# Patient Record
Sex: Female | Born: 1960 | Race: White | Hispanic: No | Marital: Single | Smoking: Never smoker
Health system: Southern US, Community
[De-identification: ages and names within clinical notes are randomized; demographics above are authoritative.]

## PROBLEM LIST (undated history)

## (undated) DIAGNOSIS — G7000 Myasthenia gravis without (acute) exacerbation: Secondary | ICD-10-CM

## (undated) DIAGNOSIS — R002 Palpitations: Secondary | ICD-10-CM

## (undated) DIAGNOSIS — E785 Hyperlipidemia, unspecified: Secondary | ICD-10-CM

## (undated) DIAGNOSIS — I10 Essential (primary) hypertension: Secondary | ICD-10-CM

## (undated) DIAGNOSIS — F329 Major depressive disorder, single episode, unspecified: Secondary | ICD-10-CM

## (undated) DIAGNOSIS — T7840XA Allergy, unspecified, initial encounter: Secondary | ICD-10-CM

## (undated) DIAGNOSIS — Z9289 Personal history of other medical treatment: Secondary | ICD-10-CM

## (undated) DIAGNOSIS — D649 Anemia, unspecified: Secondary | ICD-10-CM

## (undated) DIAGNOSIS — M436 Torticollis: Secondary | ICD-10-CM

## (undated) DIAGNOSIS — D072 Carcinoma in situ of vagina: Secondary | ICD-10-CM

## (undated) DIAGNOSIS — Z87411 Personal history of vaginal dysplasia: Secondary | ICD-10-CM

## (undated) DIAGNOSIS — R0600 Dyspnea, unspecified: Secondary | ICD-10-CM

## (undated) DIAGNOSIS — F32A Depression, unspecified: Secondary | ICD-10-CM

## (undated) HISTORY — DX: Myasthenia gravis without (acute) exacerbation: G70.00

## (undated) HISTORY — DX: Depression, unspecified: F32.A

## (undated) HISTORY — DX: Essential (primary) hypertension: I10

## (undated) HISTORY — DX: Anemia, unspecified: D64.9

## (undated) HISTORY — DX: Hyperlipidemia, unspecified: E78.5

## (undated) HISTORY — PX: CARDIOVASCULAR STRESS TEST: SHX262

## (undated) HISTORY — DX: Allergy, unspecified, initial encounter: T78.40XA

## (undated) HISTORY — DX: Major depressive disorder, single episode, unspecified: F32.9

## (undated) HISTORY — PX: TRANSTHORACIC ECHOCARDIOGRAM: SHX275

---

## 2000-05-07 ENCOUNTER — Inpatient Hospital Stay (HOSPITAL_COMMUNITY): Admission: RE | Admit: 2000-05-07 | Discharge: 2000-05-13 | Payer: Self-pay | Admitting: Obstetrics and Gynecology

## 2000-05-07 ENCOUNTER — Encounter (INDEPENDENT_AMBULATORY_CARE_PROVIDER_SITE_OTHER): Payer: Self-pay | Admitting: Specialist

## 2000-05-07 HISTORY — PX: ABDOMINAL HYSTERECTOMY: SHX81

## 2000-05-08 ENCOUNTER — Encounter: Payer: Self-pay | Admitting: Anesthesiology

## 2000-05-08 HISTORY — PX: OTHER SURGICAL HISTORY: SHX169

## 2000-05-11 ENCOUNTER — Encounter: Payer: Self-pay | Admitting: Obstetrics and Gynecology

## 2002-03-12 ENCOUNTER — Encounter (INDEPENDENT_AMBULATORY_CARE_PROVIDER_SITE_OTHER): Payer: Self-pay | Admitting: *Deleted

## 2002-03-12 ENCOUNTER — Ambulatory Visit (HOSPITAL_BASED_OUTPATIENT_CLINIC_OR_DEPARTMENT_OTHER): Admission: RE | Admit: 2002-03-12 | Discharge: 2002-03-13 | Payer: Self-pay | Admitting: Specialist

## 2002-03-12 HISTORY — PX: COMBINED ABDOMINOPLASTY AND LIPOSUCTION: SUR284

## 2003-08-31 ENCOUNTER — Other Ambulatory Visit: Admission: RE | Admit: 2003-08-31 | Discharge: 2003-08-31 | Payer: Self-pay | Admitting: Internal Medicine

## 2003-09-01 ENCOUNTER — Encounter: Payer: Self-pay | Admitting: Internal Medicine

## 2003-09-01 ENCOUNTER — Encounter: Admission: RE | Admit: 2003-09-01 | Discharge: 2003-09-01 | Payer: Self-pay | Admitting: Internal Medicine

## 2003-09-21 ENCOUNTER — Encounter: Payer: Self-pay | Admitting: Internal Medicine

## 2003-09-21 ENCOUNTER — Encounter: Admission: RE | Admit: 2003-09-21 | Discharge: 2003-09-21 | Payer: Self-pay | Admitting: Internal Medicine

## 2004-10-09 ENCOUNTER — Encounter (HOSPITAL_COMMUNITY): Admission: RE | Admit: 2004-10-09 | Discharge: 2005-01-07 | Payer: Self-pay | Admitting: General Surgery

## 2004-10-25 ENCOUNTER — Ambulatory Visit (HOSPITAL_COMMUNITY): Admission: RE | Admit: 2004-10-25 | Discharge: 2004-10-25 | Payer: Self-pay | Admitting: Endocrinology

## 2004-11-06 ENCOUNTER — Ambulatory Visit (HOSPITAL_COMMUNITY): Admission: RE | Admit: 2004-11-06 | Discharge: 2004-11-06 | Payer: Self-pay | Admitting: Endocrinology

## 2005-01-08 ENCOUNTER — Ambulatory Visit (HOSPITAL_COMMUNITY): Admission: RE | Admit: 2005-01-08 | Discharge: 2005-01-08 | Payer: Self-pay | Admitting: Neurology

## 2005-04-15 ENCOUNTER — Encounter (HOSPITAL_COMMUNITY): Admission: RE | Admit: 2005-04-15 | Discharge: 2005-07-14 | Payer: Self-pay | Admitting: Neurology

## 2005-04-15 ENCOUNTER — Ambulatory Visit (HOSPITAL_COMMUNITY): Admission: RE | Admit: 2005-04-15 | Discharge: 2005-04-15 | Payer: Self-pay | Admitting: Thoracic Surgery

## 2005-04-29 ENCOUNTER — Inpatient Hospital Stay (HOSPITAL_COMMUNITY): Admission: RE | Admit: 2005-04-29 | Discharge: 2005-05-04 | Payer: Self-pay | Admitting: Thoracic Surgery

## 2005-04-29 ENCOUNTER — Encounter (INDEPENDENT_AMBULATORY_CARE_PROVIDER_SITE_OTHER): Payer: Self-pay | Admitting: Specialist

## 2005-04-29 HISTORY — PX: OTHER SURGICAL HISTORY: SHX169

## 2005-05-08 ENCOUNTER — Ambulatory Visit (HOSPITAL_COMMUNITY): Admission: RE | Admit: 2005-05-08 | Discharge: 2005-05-09 | Payer: Self-pay | Admitting: Thoracic Surgery

## 2005-05-15 ENCOUNTER — Encounter: Admission: RE | Admit: 2005-05-15 | Discharge: 2005-05-15 | Payer: Self-pay | Admitting: Thoracic Surgery

## 2005-06-04 ENCOUNTER — Encounter: Admission: RE | Admit: 2005-06-04 | Discharge: 2005-06-04 | Payer: Self-pay | Admitting: Thoracic Surgery

## 2006-07-16 ENCOUNTER — Encounter: Admission: RE | Admit: 2006-07-16 | Discharge: 2006-07-16 | Payer: Self-pay | Admitting: Geriatric Medicine

## 2006-10-24 ENCOUNTER — Encounter: Admission: RE | Admit: 2006-10-24 | Discharge: 2006-10-24 | Payer: Self-pay | Admitting: Geriatric Medicine

## 2008-02-11 ENCOUNTER — Emergency Department (HOSPITAL_COMMUNITY): Admission: EM | Admit: 2008-02-11 | Discharge: 2008-02-11 | Payer: Self-pay | Admitting: Emergency Medicine

## 2008-02-17 ENCOUNTER — Ambulatory Visit (HOSPITAL_COMMUNITY): Admission: RE | Admit: 2008-02-17 | Discharge: 2008-02-17 | Payer: Self-pay | Admitting: Neurosurgery

## 2008-03-14 ENCOUNTER — Encounter: Admission: RE | Admit: 2008-03-14 | Discharge: 2008-06-12 | Payer: Self-pay | Admitting: Neurosurgery

## 2008-09-19 ENCOUNTER — Encounter: Admission: RE | Admit: 2008-09-19 | Discharge: 2008-12-18 | Payer: Self-pay | Admitting: Anesthesiology

## 2008-09-20 ENCOUNTER — Ambulatory Visit: Payer: Self-pay | Admitting: Anesthesiology

## 2008-10-04 ENCOUNTER — Ambulatory Visit: Payer: Self-pay | Admitting: Anesthesiology

## 2008-10-18 ENCOUNTER — Ambulatory Visit: Payer: Self-pay | Admitting: Anesthesiology

## 2009-05-19 ENCOUNTER — Emergency Department (HOSPITAL_COMMUNITY): Admission: EM | Admit: 2009-05-19 | Discharge: 2009-05-19 | Payer: Self-pay | Admitting: Family Medicine

## 2010-10-17 ENCOUNTER — Ambulatory Visit (HOSPITAL_COMMUNITY): Admission: RE | Admit: 2010-10-17 | Discharge: 2010-10-17 | Payer: Self-pay | Admitting: Family Medicine

## 2010-11-07 ENCOUNTER — Inpatient Hospital Stay (HOSPITAL_COMMUNITY): Admission: RE | Admit: 2010-11-07 | Discharge: 2010-11-08 | Payer: Self-pay | Admitting: Neurosurgery

## 2010-11-07 HISTORY — PX: LUMBAR DISC SURGERY: SHX700

## 2011-02-26 LAB — BASIC METABOLIC PANEL
BUN: 9 mg/dL (ref 6–23)
CO2: 23 mEq/L (ref 19–32)
Calcium: 9.2 mg/dL (ref 8.4–10.5)
Chloride: 109 mEq/L (ref 96–112)
Creatinine, Ser: 0.78 mg/dL (ref 0.4–1.2)
GFR calc Af Amer: >60 mL/min (ref >60)
GFR calc non Af Amer: >60 mL/min (ref >60)
Glucose, Bld: 81 mg/dL (ref 70–99)
Potassium: 4.2 mEq/L (ref 3.5–5.1)
Sodium: 138 mEq/L (ref 135–145)

## 2011-02-26 LAB — CBC
HCT: 42.1 % (ref 36.0–46.0)
Hemoglobin: 14.3 g/dL (ref 12.0–15.0)
MCH: 28.7 pg (ref 26.0–34.0)
MCHC: 34 g/dL (ref 30.0–36.0)
MCV: 84.5 fL (ref 78.0–100.0)
Platelets: 311 10*3/uL (ref 150–400)
RBC: 4.98 MIL/uL (ref 3.87–5.11)
RDW: 13.5 % (ref 11.5–15.5)
WBC: 10 10*3/uL (ref 4.0–10.5)

## 2011-02-26 LAB — SURGICAL PCR SCREEN
MRSA, PCR: NEGATIVE
Staphylococcus aureus: NEGATIVE

## 2011-04-30 NOTE — Procedures (Signed)
Regina Huber, BAZAR NO.:  [id number]   MEDICAL RECORD NO.:  [id number]         PATIENT TYPE:  HREC   LOCATION:                                 FACILITY:   PHYSICIAN:  Celene Kras, MD        DATE OF BIRTH:  Nov 29, 1961   DATE OF PROCEDURE:  DATE OF DISCHARGE:                               OPERATIVE REPORT   [facility].  I evaluated her.  We  obtained history and performed a 14-point review of systems.  Referred  to [place] through the [facility].  She has had about a year's pain in  the paracervical region after an incident hanging some curtains.  She  heard a pop.  Subsequent to this, she has been seen by our neurosurgical  colleagues and chiropractic at which she was a nonsurgical candidate.  Does not describe classic radicular symptoms.  She is describing more of  a myofascial mechanical pain, but has at times had arm pain.  She is  describing her pain as 8/10 on a subjective scale, difficulty with most  activities of daily living, frequent night wakening.  Her neurosurgeon  was Dr. Donalee Citrin.  I have reviewed notes.  She has had an MRI.  She has  some degenerative changes with facetal overlay.  She has been trialed  conservative management including steroid dose pack.   PAST MEDICAL HISTORY:  Remarkable for C-section x2, she has twins,  hysterectomy, thymectomy 2 years ago.  She also has had hypertension.   MEDICATIONS:  Attached to the chart.   SOCIAL HISTORY:  She is married and nonsmoker.   FAMILY HISTORY:  Heart disease, diabetes, hypertension, and alcohol  abuse.   REVIEW OF SYSTEMS, FAMILY, AND SOCIAL HISTORY:  Otherwise  noncontributory.   PHYSICAL EXAMINATION:  GENERAL:  A pleasant, mildly obese female,  sitting comfortably in bed.  Gait, affect, appearance is normal.  Alert  and oriented x3.  HEENT:  Otherwise unremarkable.  CHEST:  Clear to auscultation and percussion.  Regular rate and rhythm  without rub, murmur, or gallop.  ABDOMEN:  Soft, nontender, benign.  No hepatosplenomegaly.   She has diffuse paracervical suprascapular myofascial discomfort,  positive cervical facetal compression test, right and left.  Suboccipital compression test positive.  Good grip strength and  otherwise intact neurologically.   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   PLAN:  1. I would like the broad brush stroke to begin with cervical      epidural, follow expectantly.  Should she have a response, we would      consider second in series.  2. If the arm pain significantly improves, we will address the      spondylitic component with facet-mediated intervention, consider RF      with positive predictive experience, probably our best non-surgical      position.  3. Review of medication.  Appropriate.  We will go ahead and get her      on Ultram in the ER, and  Lyrica with Flector patches discussed with      her.  No barrier to communication.  She has consented for today's      procedure.   The patient was taken to fluoroscopy suite and placed in prone position.  Back prepped and draped in the usual fashion.  Using a Hustead needle, I  advanced to C7-T1 interspace without any evidence of CSF, heme, or  paresthesia.  Test block uneventfully followed 40 mg Aristocort and  flushed the needle.   She tolerated the procedure well.  No complications from procedure.  Appropriate recovery.  See her in follow up.           ______________________________  Celene Kras, MD     HH/MEDQ  D:  09/20/2008 11:35:06  T:  09/21/2008 00:08:15  Job:  [id number]

## 2011-04-30 NOTE — Procedures (Signed)
NAMECARLETA, Regina Huber                 ACCOUNT NO.:  [id number]   MEDICAL RECORD NO.:  [id number]         PATIENT TYPE:  aecp   LOCATION:                                 FACILITY:   PHYSICIAN:  Celene Kras, MD        DATE OF BIRTH:  1961/05/25   DATE OF PROCEDURE:  DATE OF DISCHARGE:                               OPERATIVE REPORT   Glenis [facility].  I evaluated her.  We  obtained history and performed 14-point review of systems.   Sallyann comes to [place] today complaining of:  1. Bilateral cervical discomfort, less arm pain, I am going to go on      to cervical facet medial branch intervention.  She will assess      frequency, intensity, and duration of headaches, she will assess      symptom management, and I have given benchmarks.  Consider RF.  2. Other modifiable features in health profile reviewed.  Review of      medication.  Another rationale performed.  Intervention minimize      escalation controlled substances.  She is a forthright engaging      individual.  I think she will probably do pretty well.   Objectively, diffuse paracervical myofascial discomfort, positive  cervical fetal compression test right and left with suboccipital  compression test positive.  Suprascapular and levator scapular pain.  Nothing new neurologically.   IMPRESSION:  Spondylosis, degenerative spine disease of the cervical  spine.   PLAN:  Cervical facet medial branch intervention C4, C5, C6, and C7  contributory innervation addressed under local anesthetic.  Predicate  further intervention based on need and overall response.  Questions  answered, used models.  She is consented.   The patient was taken to fluoroscopy suite and placed in supine position  and prepped and draped in the usual fashion.  Using a 25-gauge spinal  needle, I advanced the cervical facet at C4, C5, C6, and C7,  contributory innervation addressed, with C3 as elements of cervicogenic  component.   This is at the bifurcation.  We then injected 0.5 mL of  lidocaine 1% MPF at each level with a total of 40 mg Aristocort in  divided dose.   She tolerated the procedure well.  No complications from our procedure.  Appropriate recovery.  Discharge instructions given.           ______________________________  Celene Kras, MD     HH/MEDQ  D:  10/04/2008 11:52:11  T:  10/05/2008 00:50:34  Job:  [id number]

## 2011-04-30 NOTE — Assessment & Plan Note (Signed)
[facility] in [facility] today.  I evaluated her,  obtained history, and performed 14-point review of systems.  Slow  recrudescence.  She had benefit from last intervention and another block  is warranted sequentially prior to moving toward RF to demonstrate  improvement in benchmarks.  1. I do not think we are going to need to do that at this time, as we      are going to have to hold off couple weeks.  She has the seasonal      flu, and she is having benefit from block, but breaking through non-      narcotic medication alternatives.  I am going to go ahead and      switch her to Nucynta, with cautions given, reviewed with her.  2. Other modifiable features and health profile also reviewed.  Home      based therapy.  I discussed RF, risks, complications, options, and      implications, which we would consider at some point, but we will      see how she does with the second block.   Objectively, diffuse paracervical myofascial, positive cervical facet  compression test typical to her pain, not quite at baseline, improved  from a myofascial prospective, nothing new neurological.   IMPRESSION:  Spondylosis.   PLAN:  Agent change, we will see her in 2 weeks, consider sequential  intervention, possibly moving in our decision treat towards RF.           ______________________________  Celene Kras, MD     HH/MedQ  D:  10/18/2008 15:00:45  T:  10/19/2008 01:42:58  Job #:  [id number]

## 2011-05-03 NOTE — Op Note (Signed)
[facility]  Patient:    Regina Huber, Regina Huber                         MRN: [id number] Adm. Date:  [id number] Attending:  Lendon Colonel                           Operative Report  PREOPERATIVE DIAGNOSIS:  Massive intraperitoneal bleeding.  POSTOPERATIVE DIAGNOSIS:  Massive intraperitoneal bleeding.  PROCEDURE:  Laparotomy with suture of intraperitoneal bleeding (uterine artery).  DESCRIPTION OF PROCEDURE:  The patient was placed in the lithotomy position, prepped and draped in the usual fashion.  A Foley catheter was already inserted, and the transverse incision was opened quite easily, the fascia opened transversely, and the peritoneum entered without difficulty.  There was a massive amount of intraperitoneal blood and blood in the cul-de-sac. Initially I thought maybe the ovarian pedicles were bleeding, but they were not, and on visualizing the cul-de-sac, the bleeder was the left uterine artery, which was freely bleeding and clamped with a tonsil clamp and suture ligated with a 3-0 Vicryl suture.  This arrested the bleeding.  I then resutured the ovarian pedicles to be sure, irrigated her, and closely watched the bleeding that was hemostatically secure.  The parietal peritoneum was then closed with 2-0 PDS, the fascia was closed with 2-0 PDS, and the skin was closed with clips.  The incision and peritoneal cavity were irrigated, and all blood was removed as best as we could.  The patient tolerated this procedure well, was sent to the recovery room in good condition. DD:  05/08/00 TD:  05/13/00 Job: [id number]

## 2011-05-03 NOTE — Op Note (Signed)
NAMESTACI, DACK                 ACCOUNT NO.:  [id number]   MEDICAL RECORD NO.:  [id number]          PATIENT TYPE:  INP   LOCATION:  2899                         FACILITY:  [facility]   PHYSICIAN:  Ines Bloomer, M.D. DATE OF BIRTH:  [birth year]   DATE OF PROCEDURE:  04/29/2005  DATE OF DISCHARGE:                                 OPERATIVE REPORT   PREOPERATIVE DIAGNOSIS:  Myasthenia gravis, thymic hyperplasia.   POSTOPERATIVE DIAGNOSIS:  Myasthenia gravis, thymic hyperplasia.   OPERATION PERFORMED:  Transsternal thymectomy.   SURGEON:  Dorita Sciara, M.D.   ANESTHESIA:  General.   After general anesthesia, the patient was prepped and draped in the usual  sterile manner.  An incision was made starting at the sternal notch and  going down about 6 cm with knife.  The subcutaneous tissue and fascia was  divided with electrocautery and then the sternal saw was used to partially  split the sternum down through the manubrium.  A laminar Gelfoam was applied  to the marrow and a laminar spreader was inserted.  Then the dissection was  started in the neck, dissecting out both the left and the right horn of the  thymus gland, dissecting out from the tissues around it and one large vessel  was doubly ligated with 2-0 silk and divided and another one clipped with  Ligaclips.  After the upper horn had been divided then the thymus was  dissected superiorly around the innominate vein and the innominate vein was  then looped to vascular tape.  The thymus was also taken off inferiorly  around the innominate vein.  Dissection was started down the left side,  dissecting the thymus up from the left pleura.  The pleura was entered,  however, and the pleura was dissected off and the phrenic nerve was  identified on the left side and protected.  Then the inferior portion of the  thymus clamp was dissected up off the pericardium inferiorly and dissection  was carried over the midline toward the right,  dissecting up the right lower  lobe.  Then the right inferior portion was dissected free again, dissecting  the pleura off the thymus gland and then dissecting superiorly up and  finally removing the rest of the thymus around the innominate superior vena  cava junction.  After thymus was removed, all small vessels were  anticoagulated and possible residual thymus tissue was removed.  The area  was irrigated copiously.  A Blake drain was placed in the area and brought  out through the neck.  The sternum was closed with 4 wires, #1 Vicryl in the  muscle layer, then 3-0 Vicryl as a subcuticular stitch, Dermabond for the  skin.  The patient returned to the recovery room in stable condition.      DPB/MEDQ  D:  04/29/2005  T:  04/29/2005  Job:  [id number]

## 2011-05-03 NOTE — H&P (Signed)
Regina Huber, Regina Huber                 ACCOUNT NO.:  [id number]   MEDICAL RECORD NO.:  [id number]          PATIENT TYPE:  INP   LOCATION:  NA                           FACILITY:  [facility]   PHYSICIAN:  Ines Bloomer, M.D. DATE OF BIRTH:  07-04-1961   DATE OF ADMISSION:  04/29/2005  DATE OF DISCHARGE:                                HISTORY & PHYSICAL   CHIEF COMPLAINT:  Weakness.   HISTORY OF PRESENT ILLNESS:  This 50 year old, married [employer] employee, has  diagnosis of myasthenia gravis with positive cholinoreceptors antibody and  CT scan that shows stomach hyperplasia.  She is treated with __________  bromide 60 mg 3 times a day as well as CellCept up to 100 mg twice a day.  Other medications include metazoal 10 mg daily and atenolol 25 mg daily for  hypertension.  Symptoms include diplopia with ptosis, and she also has  generalized weakness and a positive Tensilon test.  She also was found to  have hypothyroidism and was evaluated by Dr. Evlyn Kanner for a nodule of the left  thyroid.  She has had two episodes of shortness of breath.  As mentioned,  has occasional weakness in arms and legs.  She is referred for thymectomy. A  Diatek catheter was inserted, and she had plasmapheresis for two weeks and  now comes in for the same dynamic.   PAST MEDICAL HISTORY:  1.  Hypothyroidism.  2.  C sections in 1988 and 1995.  3.  Hysterectomy in 2001.   ALLERGIES:  PENICILLIN causes a rash.   FAMILY HISTORY:  Negative for cancer and vascular disease.   SOCIAL HISTORY:  She is married and has two daughters and one son.  She does  not drink alcohol, does not smoke.  She works as an Airline pilot at [employer].   REVIEW OF SYSTEMS:  She has had some recent weight gain and is on  prednisone.  She weighs 185 pounds.  She is 5 feet 8 inches.  CARDIAC:  No  history of angina or palpitations.  PULMONARY:  She has had shortness of  breath as mentioned, but no hemoptysis, excessive sputum or pneumonia.   GI:  No nausea, vomiting, constipation, diarrhea.  Has some occasional dysphagia.  GU: She has had some problems with loss of bladder control but no dysuria.  VASCULAR:  No claudication, TIAs, but does have the blurred vision.  NEUROLOGIC:  As in History of Present Illness.  She has had weakness and  loss of energy and fatigue with weakness in her arms and legs.  She has no  history of seizures or recent headaches.  She complains of chronic joint  pain in her lower extremities.  SKIN:  Has had intermittent rash.  PSYCHIATRIC:  No history of depression or nervousness. ENT:  Diplopia.  No  change in hearing.  HEMATOLOGICAL:  No history of anemia.   PHYSICAL EXAMINATION:  GENERAL:  Well-developed, Caucasian female in no  acute distress.  VITAL SIGNS:  Blood pressure 134/80, respirations 18, O2 saturation 98%.  HEAD:  Atraumatic.  EYES:  Pupils equal,  round, and reactive to light and accommodations.  Extraocular movements are normal.  Tympanic membranes are intact.  Ears  symmetric and membranes intact.  Nares have no septal deviation and no  rhinitis. Mouth without lesions.  Uvula in midline, tongue in midline.  NECK:  Mild thyromegaly.  No carotid bruits.  No supraclavicular or axillary  adenopathy.  CHEST:  Clear to auscultation and percussion.  HEART:  Regular sinus rhythm.  No murmurs.  Pulses 2+.  BREASTS:  Without masses.  ABDOMEN:  Soft.  No hepatosplenomegaly.  EXTREMITIES:  No clubbing or edema.  NEUROLOGIC:  Alert and oriented x 3.  Deep tendon reflexes 2+.  Sensory and  motor are intact.  I did not notice any gross weakness or lateralized  weakness.  SKIN:  Without lesion.   IMPRESSION:  1.  Myasthenia gravis.  2.  Hypothyroidism.  3.  Hypertension.   PLAN:  Thymectomy.      DPB/MEDQ  D:  04/28/2005  T:  04/28/2005  Job:  [id number]

## 2011-05-03 NOTE — Discharge Summary (Signed)
NAMECONYA, Regina Huber                 ACCOUNT NO.:  [id number]   MEDICAL RECORD NO.:  [id number]          PATIENT TYPE:  INP   LOCATION:  3309                         FACILITY:  [facility]   PHYSICIAN:  Ines Bloomer, M.D. DATE OF BIRTH:  1961/03/24   DATE OF ADMISSION:  04/29/2005  DATE OF DISCHARGE:  05/04/2005                                 DISCHARGE SUMMARY   ADMISSION DIAGNOSIS:  Myasthenia gravis with thymic hyperplasia.   SECONDARY DIAGNOSES:  1.  Myasthenia gravis with thymic hyperplasia, status post thymectomy.  2.  Postoperative moderate left pleural effusion responsive to diuretic      therapy.  3.  History of cesarean section in 1988 and 1995.  4.  Abdominal hysterectomy in 2001.  5.  History of Diatek catheter insertion Apr 15, 2005 for plasmapheresis.  6.  History of hyperthyroidism with nodule of the left thyroid followed by      Dr. Tera Mater. [place].  7.  Allergic to penicillin which causes a rash.  8.   PROCEDURE:  Apr 29, 2005:  Transdermal thymectomy by Dr. Ines Bloomer.   HISTORY OF PRESENT ILLNESS:  Regina Huber is a 49 year old female with history of  myasthenia gravis. She apparently had a positive acetylcholine receptor  antibody and a CT scan which showed thymic hyperplasia. She has been treated  with pyridostigmine bromide 60 mg three times a day as well as Cellcept up  to 1000 mg twice a day. Other medications include methimazole 10 mg twice a  day and Atenolol for hypertension. Her symptoms had included diplopia with  ptosis and a positive Tensilon test. She was found to have hyperthyroidism  and was referred to Dr. Tera Mater. [place] and underwent an evaluation. She  was found to have an occult nodule on the left thyroid.   Prior to admission, she had two episodes of shortness of breath and still  has occasional weakness in her arms and legs. She was referred to Dr. Ines Bloomer for thymectomy. Recently, a Diatek catheter was inserted and she  did  undergo five plasmapheresis treatments.   HOSPITAL COURSE:  Regina Huber was admitted to [facility] on  Apr 29, 2005 and underwent a thymectomy by Dr. Ines Bloomer. Oblique  drain was placed in the mediastinal area intraoperatively. Postoperatively,  she was transferred to the surgical intensive care unit. Neurology was  notified on her admission and saw her on a regular basis. Regina Huber  hospitalization was rather uneventful, although she di develop a moderate  left pleural effusion which responded to diuretic therapy. Chest x-ray  showed bilateral basilar atelectasis and aggressive pulmonary toilet was  encouraged. She was treated empirically with vancomycin postoperatively.  Otherwise, she remained afebrile. Labs were stable postoperatively. Her  drain was discontinued before discharge. She was felt stable for transfer to  step down unit on postoperative day one. Her pain was initially managed with  a morphine PCA but was seen and able to transition to Tylox p.r.n. She did  have mild postoperative nausea but was overall able to tolerate an  oral  diet. She was able to avoid following Foley catheter removal and did have a  bowel movement postoperatively. She was able to ambulate in the hallways. It  was determined that she would not need home health therapy after discharge.   Her incisions remained clean and dry without signs of infection. By  postoperative day four, she was felt nearly ready for discharge home. She  remained afebrile with vital signs stable at 110/55 with heart rate in the  70s to 80s and sinus rhythm. She was saturating 92% on room air. Follow-up  chest x-ray did confirm decreasing left pleural effusion with remaining left  lower lobe and mid-right lower lobe atelectasis which remained stable. If  there were no significant changes in the next 24 hours, it was felt that [name] would be ready for discharge home on postoperative day five, May 04, 2005.   LABORATORY DATA:  Recent labs show a white blood cell count of 11.1,  hemoglobin of 11.1, hematocrit 32.7, platelet count 263,000. Sodium 140,  potassium 4.1, blood glucose 118, BUN less than 1, creatinine 0.8, total  bilirubin, alkaline phosphate 48, SGOT 19, SGPT 18, total protein 4.8, blood  albumin 3.1, calcium 8.2.   DISCHARGE MEDICATIONS:  1.  CellCept 500 mg 2 tablets p.o. b.i.d.  2.  Atenolol 25 mg p.o. daily.  3.  Multivitamin p.o. daily.  4.  Calcium supplement daily.  5.  Pyridostigmine 60 mg p.o. t.i.d.  6.  Methimazole 10 mg p.o. b.i.d.  7.  Tylox 1-2 tablets p.o. q.4h. p.r.n. pain.   DISCHARGE INSTRUCTIONS:  She is instructed to avoid driving, heavy lifting  greater than 10 pounds. No strenuous activity. She is encouraged to continue  daily walking exercises. She may resume a regular diet. She may shower and  clean her incisions with gently with mild soap and water. She should notify  the [facility] office if she develops fever of greater than 101 or redness or  drainage from her incision site.   FOLLOW UP:  She is to follow up with Dr. Ines Bloomer at the [facility] office  on Wednesday, May 15, 2005 at 1:20 p.m. She is to have a chest x-ray one  hour before at [facility] and instructed to bring her chest x-ray  films with her to the [facility] office. She is to call and schedule a follow-up  with [facility].      AWZ/MEDQ  D:  05/03/2005  T:  05/04/2005  Job:  [id number]   cc:   Patient's chart   [facility] Office   [facility]

## 2011-05-03 NOTE — Op Note (Signed)
[facility]  Patient:    Regina Huber, Regina Huber                         MRN: [id number] Proc. Date: 05/07/00 Adm. Date:  [id number] Attending:  Lendon Colonel                           Operative Report  PREOPERATIVE DIAGNOSIS:  Persistent menorrhagia.  POSTOPERATIVE DIAGNOSES: 1. Persistent menorrhagia, possible adenomyosis. 2. Cyst of right ovary, most likely corpus luteum cyst of right ovary.  OPERATION: 1. Total abdominal hysterectomy. 2. Right ovarian cystectomy.  SURGEON:  Katherine Roan, M.D.  DESCRIPTION OF PROCEDURE:  The patient was placed in the lithotomy position and  prepped and draped in the usual fashion.  The bladder was catheterized and a transverse incision was made in the abdomen, and extended in layers to the peritoneal cavity, which was entered vertically.  Exploration of the upper abdomen revealed two normal kidneys and liver.  I felt no gallstones.  The abdominal viscera were then packed away from the pelvic viscera.  There was a small cyst n the right ovary.  The left ovary was normal.  The cornual aspects of the uterus  were grasped and the uterus was elevated.  The utero___ anastomoses on each side were ligated with suture ligatures and free ties.  The round ligaments were suture ligated.  Following this the bladder flap was dissected off of the lower segment. The uterine vessels were skeletonized and ligated.  The Cardinals and uterosacral ligaments were clamped and ligated and identified.  The angles of the vagina were entered.  The specimens removed from the operative field.  The vagina was then closed with a horizontal mattress suture of #0 chromic, and figure-of-eight sutures of #0 Vicryl.  The uterosacral ligaments were carefully plicated in the midline for good wall support, and the retroperitoneal base was closed with #3-0 Vicryl. Hemostasis was secured.  The right ovary was then delivered into the  operative field, and the ovarian cystectomy performed.  The ovarian stroma was then closed with the mattress suture of #3-0 Vicryl.  Hemostasis was accomplished with the Bovie as well.  The pelvis was irrigated with copious amounts of saline.  The parietal peritoneum was closed with a #2-0 Vicryl.  The rectus muscles were plicated in the midline with a continuation of #2-0 PDS.  The fascia was closed with a running suture of #2-0 DS, and two interrupted sutures of #0 Vicryl.  Hemostasis in the subcutaneous was secure.  The subcutaneous was irrigated and the skin was closed with a subcuticular stitch of #3-0 Dexon.  The skin was then infiltrated with 0.5% Marcaine with epinephrine.  The patient  tolerated this procedure well.  She was sent to the recovery room in good condition. DD:  05/07/00 TD:  05/11/00 Job: [id number]

## 2011-05-03 NOTE — Op Note (Signed)
[place]. [facility]  Patient:    Regina, Huber Visit Number: [id number] MRN: [id number]          Service Type: DSU Location: [facility] Attending Physician:  Gustavus Messing Dictated by:   Yaakov Guthrie. Shon Hough, M.D. Proc. 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  OPERATION/PROCEDURE: Abdominoplasty with repair of diastasis recti and liposuction to above areas.  SURGEON: Yaakov Guthrie. Shon Hough, M.D.  ASSISTANT: Margaretha Sheffield, R.N.  ANESTHESIA: General.  DESCRIPTION OF PROCEDURE: The patient was taken to the operating room and placed on the operating room table in the supine position.  She was given adequate general anesthesia and intubated orally.  Prep was done to the chest and breast areas and abdominal areas and groin and thigh areas using Betadine soap and solution and walled off with sterile towels and drapes so as to make a sterile field.  ______ was injected throughout the abdomen and the inner thighs, a total of 650 cc.  After we allowed it to set up a W-plasty incision was made with a #15 blade down through Scarpas fascia into the underlying fascia.  Hemostasis was maintained with the Bovie unit on coagulation.  The dissected plane was carried up over the fascia to the umbilicus.  The umbilicus was released on its own pedicle and then dissection was carried up to the xiphoid process as well as the right and left upper quadrants.  After proper hemostasis liposuction was fashioned on the right and left ischial areas as well as upper abdominal areas, and dilation of the abdominal wall where there was severe diastasis recti, which was repaired with a running Prolene locking suture  from the xiphoid process to the umbilicus and from the umbilicus to the suprapubic area.  Irrigation was done with copious amounts of saline and after proper hemostasis the patient was placed in the jackknife position and excess tissue was removed, skin, and subcutaneous closure then done with 2-0 Vicryl x2 layers, and then a running subcuticular stitch of 3-0 Vicryl.  The wounds were drained with a large Hemovac drain which was placed through the left and right abdominal area and brought out through the suprapubic area and secured with 3-0 Prolene.  Steri-Strips and soft dressings were applied to the all the areas.  Next, attention was drawn to the inner thighs and liposuction was fashioned on the area using a [place] catheter 28-3 and 4s.  The incision was then reclosed with 5-0 nylon.  The wounds were cleansed and Steri-Strips and soft dressings applied and an abdominoplasty liposuction garment.  Estimated blood loss, 150 cc.  Complications, none. Dictated by:   Yaakov Guthrie. Shon Hough, M.D. Attending Physician:  Gustavus Messing DD:  03/12/02 TD:  03/13/02 Job: [id number]

## 2011-05-03 NOTE — Op Note (Signed)
NAMECELISSE, CIULLA                 ACCOUNT NO.:  [id number]   MEDICAL RECORD NO.:  [id number]          PATIENT TYPE:  OIB   LOCATION:  2899                         FACILITY:  [facility]   PHYSICIAN:  Ines Bloomer, M.D. DATE OF BIRTH:  02/18/1961   DATE OF PROCEDURE:  04/15/2005  DATE OF DISCHARGE:                                 OPERATIVE REPORT   PREOPERATIVE DIAGNOSIS:  Myesthenia gravis.   POSTOPERATIVE DIAGNOSIS:  Myesthenia gravis.   OPERATION PERFORMED:  Insertion of right internal jugular Diatek.   SURGEON:  Ines Bloomer, M.D.   ANESTHESIA:  1% Xylocaine anesthesia and IV sedation.   DESCRIPTION OF PROCEDURE:  After prepping and draping the right neck, an  area was infiltrated with 1% Xylocaine and a right IJ puncture was performed  and a guidewire threaded to the right atrium under fluoroscopic guidance.  Stab wound was made around the guidewire, then a 12, 14, and 16 dilator were  placed over the guidewire with the peel-away sheath being around the 16  dilator.  The 16 dilator and guidewire were removed and the Diatek catheter  was passed under fluoroscopy guidance to the right atrium and the peel-away  sheath was removed.  Another area was infiltrated  in the infraclavicular  area and a stab wound made and a tunneling device was tunneled from the  inferior stab wound up to the stab wound around the guidewire. It was then  locked to the Diatek catheter and retrograde tunneled down to the exit stab  wound.  Another area was infiltrated with 1% Xylocaine inferior to this and  the suture cuff was sutured in place with 3-0 nylon.  The proximal stab  wound was closed with 3-0 nylon.  It flushed easily.  The Diatek catheter  had been cut appropriately and the aspiration device was placed into the  catheter and the locking nut put in place.  After it was functioning  appropriately, it was capped off and the patient was returned to the  recovery room in stable  condition.      DPB/MEDQ  D:  04/15/2005  T:  04/15/2005  Job:  [id number]

## 2011-05-03 NOTE — Discharge Summary (Signed)
[facility]  Patient:    Regina Huber, Regina Huber                         MRN: [id number] Adm. Date:  [id number] Disc. Date: [id number] Attending:  Lendon Colonel                           Discharge Summary  ADMISSION DIAGNOSES: 1. Menorrhagia, persistent. 2. Uterine adenomyosis. 3. Postoperative hemorrhage.  OPERATION:  Total abdominal hysterectomy, right ovarian cystectomy, exploratory laparotomy and suture of postoperative bleeding.  BRIEF HISTORY:  The patient is a 50 year old G2 P2 female who is admitted for hysterectomy for continued heavy periods despite using birth control pills and endometrial ablation.  Her periods were so heavy that it altered her life-style and she desired this to be corrected.  LABORATORY:  Admission hemoglobin was 10.5.  Postoperative hematocrit was 6.0. On May 08, 2000 at 10:00 AM, it was 8.7 and at 5:00 PM, it was 9.4.  On May 09, 2000, it was 7.3.  On May 10, 2000, it was 6.8 and on May 12, 2000, it was 8.6.  Her coagulation profile preoperatively and metabolic profile were normal.  HOSPITAL COURSE:  The patient was admitted to the hospital and underwent an uneventful hysterectomy with removal of a corpus luteum cyst of the right ovary.  She did very well in the immediate postoperative period and at about 11 oclock the night of surgery, her urine output had dropped somewhat and by 6 oclock the next morning, it was noted that her blood pressure was 70/50 and her heart rate was 130.  It appeared at that time that she had intraperitoneal hemorrhage or an anemia that was not responding to just fluid therapy.  She was then typed and cross-matched for blood and given four units of blood, stabilized in the intensive care.  After being given four units of blood, she was taken to the operating room and a laparotomy was performed the evacuation of tremendous amounts of intraperitoneal blood and suture of a bleeding left uterine  artery.  There were about three sutures that were required. Postoperatively, with a matter of fluid management and blood transfusions, she ended up with approximately nine units of blood.  From that point on, she stabilized and was discharged on May 13, 2000.  She was asked to return to the office in one week and call as needed for bleeding or fever.  During her preoperative counsel, the patient had detailed the informed risk of blood transfusions and postoperatively, she and her husband were again apprised of the risk of blood transfusions.  CONDITION ON DISCHARGE:  Improved. DD:  06/03/00 TD:  06/04/00 Job: [id number]

## 2011-10-10 ENCOUNTER — Inpatient Hospital Stay (INDEPENDENT_AMBULATORY_CARE_PROVIDER_SITE_OTHER)
Admission: RE | Admit: 2011-10-10 | Discharge: 2011-10-10 | Disposition: A | Discharge status: Home / Self Care | Payer: 59 | Source: Ambulatory Visit | Attending: Family Medicine | Admitting: Family Medicine

## 2011-10-10 DIAGNOSIS — J40 Bronchitis, not specified as acute or chronic: Secondary | ICD-10-CM

## 2013-02-24 ENCOUNTER — Encounter (INDEPENDENT_AMBULATORY_CARE_PROVIDER_SITE_OTHER): Payer: Self-pay | Admitting: Surgery

## 2013-03-01 ENCOUNTER — Ambulatory Visit (INDEPENDENT_AMBULATORY_CARE_PROVIDER_SITE_OTHER): Payer: 59 | Admitting: Surgery

## 2013-03-08 ENCOUNTER — Encounter: Payer: Self-pay | Admitting: Family Medicine

## 2013-03-08 ENCOUNTER — Ambulatory Visit (INDEPENDENT_AMBULATORY_CARE_PROVIDER_SITE_OTHER): Payer: 59 | Admitting: Family Medicine

## 2013-03-08 VITALS — BP 130/82 | HR 72 | Temp 98.3°F | Ht 67.0 in | Wt 222.0 lb

## 2013-03-08 DIAGNOSIS — F329 Major depressive disorder, single episode, unspecified: Secondary | ICD-10-CM

## 2013-03-08 DIAGNOSIS — F32A Depression, unspecified: Secondary | ICD-10-CM | POA: Insufficient documentation

## 2013-03-08 DIAGNOSIS — I10 Essential (primary) hypertension: Secondary | ICD-10-CM

## 2013-03-08 DIAGNOSIS — E785 Hyperlipidemia, unspecified: Secondary | ICD-10-CM

## 2013-03-08 DIAGNOSIS — F419 Anxiety disorder, unspecified: Secondary | ICD-10-CM | POA: Insufficient documentation

## 2013-03-08 DIAGNOSIS — R635 Abnormal weight gain: Secondary | ICD-10-CM | POA: Insufficient documentation

## 2013-03-08 LAB — COMPREHENSIVE METABOLIC PANEL
ALT: 14 U/L (ref 0–35)
AST: 15 U/L (ref 0–37)
Albumin: 4 g/dL (ref 3.5–5.2)
Alkaline Phosphatase: 68 U/L (ref 39–117)
BUN: 13 mg/dL (ref 6–23)
CO2: 25 mEq/L (ref 19–32)
Calcium: 9 mg/dL (ref 8.4–10.5)
Chloride: 103 mEq/L (ref 96–112)
Creatinine, Ser: 0.9 mg/dL (ref 0.4–1.2)
GFR: 73.77 mL/min (ref >60.00)
Glucose, Bld: 105 mg/dL — ABNORMAL HIGH (ref 70–99)
Potassium: 3.8 mEq/L (ref 3.5–5.1)
Sodium: 137 mEq/L (ref 135–145)
Total Bilirubin: 0.6 mg/dL (ref 0.3–1.2)
Total Protein: 7.6 g/dL (ref 6.0–8.3)

## 2013-03-08 LAB — LIPID PANEL
Cholesterol: 243 mg/dL — ABNORMAL HIGH (ref 0–200)
HDL: 37.4 mg/dL — ABNORMAL LOW (ref >39.00)
Total CHOL/HDL Ratio: 6
Triglycerides: 207 mg/dL — ABNORMAL HIGH (ref 0.0–149.0)
VLDL: 41.4 mg/dL — ABNORMAL HIGH (ref 0.0–40.0)

## 2013-03-08 LAB — TSH: TSH: 0.55 u[IU]/mL (ref 0.35–5.50)

## 2013-03-08 LAB — LDL CHOLESTEROL, DIRECT: Direct LDL: 177.4 mg/dL

## 2013-03-08 LAB — T4, FREE: Free T4: 0.79 ng/dL (ref 0.60–1.60)

## 2013-03-08 MED ORDER — HYDROCHLOROTHIAZIDE 12.5 MG PO CAPS: 12.5000 mg | ORAL_CAPSULE | ORAL | Status: DC

## 2013-03-08 NOTE — Progress Notes (Signed)
Subjective:    Patient ID: Regina Huber, female    DOB: [birth year], 52 y.o.   MRN: [id number]  HPI  52 yo very pleasant female here to establish care.  MG- diagnosed in 2008, s/p thymectomy.  HTN-  Has been on atenolol for 4-5 years.  Never tried any other anti hypertensives.  Has been trying to lose weight and notices that she can't get her heart rate up.  She has no h/o CAD.  No h/o palpitations.  Does have some LE edema, always has had some.  HLD- on pravachol 10 mg daily.  Unsure when lipids were last checked. Denies any myalgias.  Depression- has been on lexapro 20 mg daily for years.  Denies any symptoms of anxiety or depression.  Patient Active Problem List  Diagnosis  . Hypertension  . Hyperlipidemia  . Depression  . Weight gain   Past Medical History  Diagnosis Date  . Hypertension   . Hyperlipidemia   . Depression   . Myasthenia gravis 2008   Past Surgical History  Procedure Laterality Date  . Cesarean section    . Thymactomy  2009  . Reconstruction adbominal wall    . Abdominal hysterectomy     History  Substance Use Topics  . Smoking status: Never Smoker   . Smokeless tobacco: Not on file  . Alcohol Use: Not on file   Family History  Problem Relation Age of Onset  . Cancer Mother 11    breast CA  . Hypertension Mother   . Diabetes Mother   . Cancer Father 85    colon CA  . Alcohol abuse Father   . Hypertension Father   . Stroke Father   . Heart disease Brother 68    MI   Allergies  Allergen Reactions  . Penicillins    No current outpatient prescriptions on file prior to visit.   No current facility-administered medications on file prior to visit.   The PMH, PSH, Social History, Family History, Medications, and allergies have been reviewed in [facility], and have been updated if relevant.   Review of Systems See HPI Patient reports no  vision/ hearing changes,anorexia, weight change, fever ,adenopathy, persistant / recurrent hoarseness,  swallowing issues, chest pain, edema,persistant / recurrent cough, hemoptysis, dyspnea(rest, exertional, paroxysmal nocturnal), gastrointestinal  bleeding (melena, rectal bleeding), abdominal pain, excessive heart burn, GU symptoms(dysuria, hematuria, pyuria, voiding/incontinence  Issues) syncope, focal weakness, severe memory loss, concerning skin lesions, depression, anxiety, abnormal bruising/bleeding, major joint swelling, breast masses or abnormal vaginal bleeding.        Objective:   Physical Exam BP 130/82  Pulse 72  Temp(Src) 98.3 F (36.8 C)  Ht 5\' 7"  (1.702 m)  Wt 222 lb (100.699 kg)  BMI 34.76 kg/m2  General:  Well-developed,well-nourished,in no acute distress; alert,appropriate and cooperative throughout examination Head:  normocephalic and atraumatic.   Eyes:  vision grossly intact, pupils equal, pupils round, and pupils reactive to light.   Ears:  R ear normal and L ear normal.   Nose:  no external deformity.   Mouth:  good dentition.   Neck:  No deformities, masses, or tenderness noted. Lungs:  Normal respiratory effort, chest expands symmetrically. Lungs are clear to auscultation, no crackles or wheezes. Heart:  Normal rate and regular rhythm. S1 and S2 normal without gallop, murmur, click, rub or other extra sounds. Msk:  No deformity or scoliosis noted of thoracic or lumbar spine.   Extremities:  No clubbing, cyanosis, edema, or deformity noted with  normal full range of motion of all joints.   Neurologic:  alert & oriented X3 and gait normal.   Skin:  Intact without suspicious lesions or rashes Cervical Nodes:  No lymphadenopathy noted Axillary Nodes:  No palpable lymphadenopathy Psych:  Cognition and judgment appear intact. Alert and cooperative with normal attention span and concentration. No apparent delusions, illusions, hallucinations      Assessment & Plan:  1. Hypertension Stable.  Will d/c atenolol as she does not need a beta blocker.  Start HCTZ 12.5 mg  daily.  She will check BP at work and call me with readings.   - Comprehensive metabolic panel  2. Hyperlipidemia On pravachol.  Check lipid panel today.   - Lipid Panel  3. Depression Stable on lexapro.    4. Weight gain Check labs today.  Discussed diet and exercise. - TSH - T4, Free

## 2013-03-08 NOTE — Patient Instructions (Addendum)
It was nice to meet you. Please STOP taking atenolol. Start taking HCTZ 12.5 mg daily. Please check your blood at work a few times over the next couple of weeks and call me with readings.

## 2013-03-09 MED ORDER — PRAVASTATIN SODIUM 20 MG PO TABS: 20.0000 mg | ORAL_TABLET | Freq: Every day | ORAL | Status: DC

## 2013-03-09 NOTE — Addendum Note (Signed)
Addended by: Eliezer Bottom on: 03/09/2013 12:40 PM   Modules accepted: Orders

## 2013-04-06 ENCOUNTER — Telehealth: Payer: Self-pay | Admitting: *Deleted

## 2013-04-06 ENCOUNTER — Encounter (INDEPENDENT_AMBULATORY_CARE_PROVIDER_SITE_OTHER): Payer: Self-pay | Admitting: Surgery

## 2013-04-06 ENCOUNTER — Ambulatory Visit (INDEPENDENT_AMBULATORY_CARE_PROVIDER_SITE_OTHER): Payer: Commercial Managed Care - PPO | Admitting: Surgery

## 2013-04-06 VITALS — BP 124/78 | HR 68 | Temp 98.6°F | Resp 14 | Ht 67.0 in | Wt 220.0 lb

## 2013-04-06 DIAGNOSIS — M6208 Separation of muscle (nontraumatic), other site: Secondary | ICD-10-CM

## 2013-04-06 DIAGNOSIS — K429 Umbilical hernia without obstruction or gangrene: Secondary | ICD-10-CM

## 2013-04-06 DIAGNOSIS — M6200 Separation of muscle (nontraumatic), unspecified site: Secondary | ICD-10-CM

## 2013-04-06 MED ORDER — HYDROCHLOROTHIAZIDE 12.5 MG PO CAPS: 12.5000 mg | ORAL_CAPSULE | ORAL | Status: DC

## 2013-04-06 NOTE — Progress Notes (Signed)
Patient ID: Regina Huber, female   DOB: 1961-09-17, 52 y.o.   MRN: [id number]  Chief Complaint  Patient presents with  . Hernia    HPI Regina Huber is a 52 y.o. female.   HPI This is a very pleasant female referred by Dr. Zelphia Cairo For evaluation of a ventral hernia. The patient reports he has had multiple number procedures. She recently had a pulling type of sensation for pain at her umbilicus.  She reports pain on both sides of her abdomen as well as fullness in the upper abdomen. She has had no nausea or vomiting or obstructive symptoms. Bowel movements are normal. Past Medical History  Diagnosis Date  . Hypertension   . Hyperlipidemia   . Depression   . Myasthenia gravis 2008    Past Surgical History  Procedure Laterality Date  . Cesarean section    . Thymactomy  2009  . Reconstruction adbominal wall    . Abdominal hysterectomy      Family History  Problem Relation Age of Onset  . Cancer Mother 44    breast CA  . Hypertension Mother   . Diabetes Mother   . Cancer Father 10    colon CA  . Alcohol abuse Father   . Hypertension Father   . Stroke Father   . Heart disease Brother 36    MI    Social History History  Substance Use Topics  . Smoking status: Never Smoker   . Smokeless tobacco: Not on file  . Alcohol Use: No    Allergies  Allergen Reactions  . Penicillins     Current Outpatient Prescriptions  Medication Sig Dispense Refill  . escitalopram (LEXAPRO) 20 MG tablet Take 20 mg by mouth daily.      . hydrochlorothiazide (MICROZIDE) 12.5 MG capsule Take 1 capsule (12.5 mg total) by mouth every morning.  30 capsule  3  . pravastatin (PRAVACHOL) 20 MG tablet Take 1 tablet (20 mg total) by mouth daily.  90 tablet  1   No current facility-administered medications for this visit.    Review of Systems Review of Systems  Constitutional: Negative for fever, chills and unexpected weight change.  HENT: Negative for hearing loss, congestion, sore  throat, trouble swallowing and voice change.   Eyes: Negative for visual disturbance.  Respiratory: Negative for cough and wheezing.   Cardiovascular: Negative for chest pain, palpitations and leg swelling.  Gastrointestinal: Positive for abdominal pain. Negative for nausea, vomiting, diarrhea, constipation, blood in stool, abdominal distention and anal bleeding.  Genitourinary: Negative for hematuria, vaginal bleeding and difficulty urinating.  Musculoskeletal: Negative for arthralgias.  Skin: Negative for rash and wound.  Neurological: Negative for seizures, syncope and headaches.  Hematological: Negative for adenopathy. Does not bruise/bleed easily.  Psychiatric/Behavioral: Negative for confusion.    Blood pressure 124/78, pulse 68, temperature 98.6 F (37 C), temperature source Temporal, resp. rate 14, height 5\' 7"  (1.702 m), weight 220 lb (99.791 kg).  Physical Exam Physical Exam  Constitutional: She is oriented to person, place, and time. She appears well-developed and well-nourished. No distress.  HENT:  Head: Normocephalic and atraumatic.  Eyes: Conjunctivae are normal. Pupils are equal, round, and reactive to light.  Neck: Normal range of motion. Neck supple.  Cardiovascular: Normal rate, regular rhythm, normal heart sounds and intact distal pulses.   Pulmonary/Chest: Effort normal.  Abdominal: Soft. She exhibits no distension. There is tenderness. There is no rebound.  Her abdomen is soft. There is a small reducible  hernia at the umbilicus. She also has moderate rectus diastases of the upper abdomen  Neurological: She is alert and oriented to person, place, and time.  Skin: She is not diaphoretic.  Psychiatric: Her behavior is normal.    Data Reviewed   Assessment    Umbilical hernia Rectus diastases     Plan    I explained the diagnosis to her. I told her I could easily fix the umbilical hernia with mesh but this would not fix her rectus diastases. She would need  evaluation by a plastic surgeon regarding the rectus diastases. She reports that she will make this appointment on her own.  I discussed the risk of incarceration of the umbilical hernia which I filled small. Should she decide to have the rectus diastases fixed by plastic surgeon, I could fix the umbilical hernia at the same time.        Kaleigha Chamberlin A 04/06/2013, 10:40 AM

## 2013-04-06 NOTE — Telephone Encounter (Signed)
Pt called to let you know that the HCTZ that you started her on seems to be working,  Her BP was 124/78 this morning and has been running in the range that you told her you wanted it.  She's asking if you want to send a script [facility].  Please advise.

## 2013-04-06 NOTE — Telephone Encounter (Signed)
Refill sent, patient advised.

## 2013-04-06 NOTE — Telephone Encounter (Signed)
That's great.  Yes, ok to refill as she requested.

## 2013-04-07 ENCOUNTER — Other Ambulatory Visit: Payer: Self-pay | Admitting: *Deleted

## 2013-04-07 MED ORDER — CITALOPRAM HYDROBROMIDE 40 MG PO TABS: 40.0000 mg | ORAL_TABLET | Freq: Every day | ORAL | Status: DC

## 2013-04-07 NOTE — Telephone Encounter (Signed)
Received a refill request for citalopram 40 mg. It was not on pt's med list, so I called her and verified she is taking it along with lexapro. Is it ok to refill?

## 2013-04-26 ENCOUNTER — Ambulatory Visit: Payer: 59 | Admitting: *Deleted

## 2013-06-14 ENCOUNTER — Other Ambulatory Visit: Payer: Self-pay | Admitting: Family Medicine

## 2013-07-15 ENCOUNTER — Other Ambulatory Visit: Payer: Self-pay | Admitting: *Deleted

## 2013-07-15 MED ORDER — CITALOPRAM HYDROBROMIDE 40 MG PO TABS: 40.0000 mg | ORAL_TABLET | Freq: Every day | ORAL | Status: DC

## 2013-08-17 ENCOUNTER — Telehealth: Payer: Self-pay | Admitting: Family Medicine

## 2013-08-17 NOTE — Telephone Encounter (Signed)
[facility] Triage Call Report Triage Record Num: [id number] Operator: Chevis Pretty Patient Name: Regina Huber Call Date & Time: 08/15/2013 11:37:38AM Patient Phone: [phone] PCP: Ruthe Mannan Patient Gender: Female PCP Fax : [phone] Patient DOB: [birth year] Practice Name: [facility] Reason for Call: Caller: Adri/Patient; PCP: Ruthe Mannan (Family Practice); CB#: [phone]; Call regarding Citalopram; astates she is due to come in for a new Rx this week, but has misplaced the remaining doses she has and has been out of medication x 2 days. Wants enough called in to last until 08/17/13 when she can call the office for a renewal of her Rx. Per standing orders, citalopram 40mg , 1 po qd, #4 only, NR called in to [facility] [phone]. Patient to follow up wtih office in AM 08/17/13. Protocol(s) Used: Medication Questions - Adult Recommended Outcome per Protocol: Provided Health Information Reason for Outcome: Caller has medication question(s) that was answered with available resources Care Advice: Medication Storage: - Store medication as directed, for example, refrigeration. - Don't store medications in the bathroom medicine cabinet or in direct sunlight. Humidity, heat and light can affect medications' potency and safety. - Store all medications out of the reach of children. ~ Safe Use of Medications: - Keep a list of your medications, listing both brand and generic names, the prescribed dosage, common side effects, recommended action for side effects, when to call their provider, and any other specific instructions. This medication list should be updated if any prescriptions change or if new medications are added. Your list should include nonprescription medications, vitamins and supplements. Ask about interaction with other medications, supplements or foods. - Take the medication list to each provider visit, especially if seeing more than one doctor. Make  note of any known allergies on this list. - Use your medication exactly as directed. Any concerns about side effects should be discussed with your pharmacist or prescribing provider before changing doses or stopping the medication. - Don't chew or crush tablets or open capsules unless told to do so. Some long-acting medications can be absorbed too quickly when chewed or crushed. Other medications either won't be effective or could cause side effects. - If you have difficulty swallowing pills, ask your provider or the pharmacist if the medication is available in liquid form. - For liquid medications, only use measuring device that came with it or was provided by the pharmacy. Household teaspoons and tablespoons can be inaccurate. - Never take anyone else's medication. ~ 08/15/2013 11:47:45AM Page 1 of 1 [id number]

## 2013-08-17 NOTE — Telephone Encounter (Signed)
Please call to check on pt. 

## 2013-08-17 NOTE — Telephone Encounter (Signed)
Left message asking patient to call back

## 2013-09-15 ENCOUNTER — Other Ambulatory Visit: Payer: Self-pay | Admitting: Family Medicine

## 2013-10-26 ENCOUNTER — Encounter: Payer: Self-pay | Admitting: Family Medicine

## 2014-01-10 ENCOUNTER — Other Ambulatory Visit: Payer: Self-pay | Admitting: Family Medicine

## 2014-01-10 NOTE — Telephone Encounter (Signed)
Pt requesting medication refill. Last ov 02/2013 with no future appts scheduled. pls advise

## 2014-01-11 ENCOUNTER — Other Ambulatory Visit: Payer: Self-pay | Admitting: Family Medicine

## 2014-01-11 MED ORDER — CITALOPRAM HYDROBROMIDE 40 MG PO TABS: 40.0000 mg | ORAL_TABLET | Freq: Every day | ORAL | Status: DC

## 2014-01-11 NOTE — Telephone Encounter (Signed)
Last filled 12/14/2013

## 2014-03-04 ENCOUNTER — Encounter: Payer: Self-pay | Admitting: Internal Medicine

## 2014-03-04 ENCOUNTER — Ambulatory Visit (INDEPENDENT_AMBULATORY_CARE_PROVIDER_SITE_OTHER): Payer: 59 | Admitting: Internal Medicine

## 2014-03-04 VITALS — BP 130/90 | HR 97 | Temp 97.7°F | Wt 213.8 lb

## 2014-03-04 DIAGNOSIS — R5383 Other fatigue: Principal | ICD-10-CM

## 2014-03-04 DIAGNOSIS — R5381 Other malaise: Secondary | ICD-10-CM

## 2014-03-04 DIAGNOSIS — I10 Essential (primary) hypertension: Secondary | ICD-10-CM

## 2014-03-04 LAB — CBC WITH DIFFERENTIAL/PLATELET
Basophils Absolute: 0 10*3/uL (ref 0.0–0.1)
Basophils Relative: 0 % (ref 0–1)
Eosinophils Absolute: 0.2 10*3/uL (ref 0.0–0.7)
Eosinophils Relative: 2 % (ref 0–5)
HCT: 42 % (ref 36.0–46.0)
Hemoglobin: 14.7 g/dL (ref 12.0–15.0)
Lymphocytes Relative: 18 % (ref 12–46)
Lymphs Abs: 2.2 10*3/uL (ref 0.7–4.0)
MCH: 28.8 pg (ref 26.0–34.0)
MCHC: 35 g/dL (ref 30.0–36.0)
MCV: 82.2 fL (ref 78.0–100.0)
Monocytes Absolute: 1.1 10*3/uL — ABNORMAL HIGH (ref 0.1–1.0)
Monocytes Relative: 9 % (ref 3–12)
Neutro Abs: 8.8 10*3/uL — ABNORMAL HIGH (ref 1.7–7.7)
Neutrophils Relative %: 71 % (ref 43–77)
Platelets: 304 10*3/uL (ref 150–400)
RBC: 5.11 MIL/uL (ref 3.87–5.11)
RDW: 13.8 % (ref 11.5–15.5)
WBC: 12.4 10*3/uL — ABNORMAL HIGH (ref 4.0–10.5)

## 2014-03-04 MED ORDER — HYDROCHLOROTHIAZIDE 25 MG PO TABS: 25.0000 mg | ORAL_TABLET | Freq: Every day | ORAL | Status: DC

## 2014-03-04 NOTE — Assessment & Plan Note (Signed)
Better on HCTZ 25 mg  Will send RX today Will check basic labs today

## 2014-03-04 NOTE — Addendum Note (Signed)
Addended by: Ellamae Sia on: 03/04/2014 05:09 PM   Modules accepted: Orders

## 2014-03-04 NOTE — Patient Instructions (Addendum)

## 2014-03-04 NOTE — Progress Notes (Signed)
Pre visit review using our clinic review tool, if applicable. No additional management support is needed unless otherwise documented below in the visit note. 

## 2014-03-04 NOTE — Progress Notes (Signed)
Subjective:    Patient ID: Regina Huber, female    DOB: [birth year], 53 y.o.   MRN: [id number]  HPI  Pt presents to the clinic today with c/o elevated blood pressure and burred vision. She noticed this 3 days ago. She took her BP and it was 160/99. She has had some chest discomfort, chest tightness, palpitations and fatigue. She has started taking her HCTZ twice daily. It has helped. Her BP today is 130/90.  Review of Systems  Past Medical History  Diagnosis Date  . Hypertension   . Hyperlipidemia   . Depression   . Myasthenia gravis 2008    Current Outpatient Prescriptions  Medication Sig Dispense Refill  . citalopram (CELEXA) 40 MG tablet Take 1 tablet (40 mg total) by mouth daily.  90 tablet  0  . escitalopram (LEXAPRO) 20 MG tablet Take 20 mg by mouth daily.      . hydrochlorothiazide (MICROZIDE) 12.5 MG capsule Take 1 capsule (12.5 mg total) by mouth every morning.  90 capsule  1  . pravastatin (PRAVACHOL) 20 MG tablet TAKE 1 TABLET BY MOUTH DAILY.  90 tablet  1   No current facility-administered medications for this visit.    Allergies  Allergen Reactions  . Penicillins     Family History  Problem Relation Age of Onset  . Cancer Mother 84    breast CA  . Hypertension Mother   . Diabetes Mother   . Cancer Father 14    colon CA  . Alcohol abuse Father   . Hypertension Father   . Stroke Father   . Heart disease Brother 65    MI    History   Social History  . Marital Status: Married    Spouse Name: N/A    Number of Children: N/A  . Years of Education: N/A   Occupational History  . Not on file.   Social History Main Topics  . Smoking status: Never Smoker   . Smokeless tobacco: Not on file  . Alcohol Use: No  . Drug Use: No  . Sexual Activity: Not on file   Other Topics Concern  . Not on file   Social History Narrative   Married, 20 yo twin daughters.   Works at [employer] in [employer], mining.              Constitutional: Pt reports fatigue. Denies  fever, malaise, headache or abrupt weight changes.  HEENT: Pt reports blurred vision. Denies eye pain, eye redness, ear pain, ringing in the ears, wax buildup, runny nose, nasal congestion, bloody nose, or sore throat. Respiratory: Denies difficulty breathing, shortness of breath, cough or sputum production.   Cardiovascular: Pt reports chest tightness and palpitations. Denies chest pain, or swelling in the hands or feet.  Neurological: Denies dizziness, difficulty with memory, difficulty with speech or problems with balance and coordination.   No other specific complaints in a complete review of systems (except as listed in HPI above).     Objective:   Physical Exam    BP 130/90  Pulse 97  Temp(Src) 97.7 F (36.5 C) (Oral)  Wt 213 lb 12 oz (96.956 kg)  SpO2 97% Wt Readings from Last 3 Encounters:  03/04/14 213 lb 12 oz (96.956 kg)  04/06/13 220 lb (99.791 kg)  03/08/13 222 lb (100.699 kg)    General: Appears her stated age, well developed, well nourished in NAD. HEENT: Head: normal shape and size; Eyes: sclera white, no icterus, conjunctiva pink, PERRLA and EOMs  intact; Ears: Tm's gray and intact, normal light reflex; Nose: mucosa pink and moist, septum midline; Throat/Mouth: Teeth present, mucosa pink and moist, no exudate, lesions or ulcerations noted.   Cardiovascular: Normal rate and rhythm. S1,S2 noted.  No murmur, rubs or gallops noted. No JVD or BLE edema. No carotid bruits noted. Pulmonary/Chest: Normal effort and positive vesicular breath sounds. No respiratory distress. No wheezes, rales or ronchi noted.  Neurological: Alert and oriented. Cranial nerves II-XII intact. Coordination normal. +DTRs bilaterally.  BMET    Component Value Date/Time   NA 137 03/08/2013 1444   K 3.8 03/08/2013 1444   CL 103 03/08/2013 1444   CO2 25 03/08/2013 1444   GLUCOSE 105* 03/08/2013 1444   BUN 13 03/08/2013 1444   CREATININE 0.9 03/08/2013 1444   CALCIUM 9.0 03/08/2013 1444   GFRNONAA >60  11/05/2010 1319   GFRAA  Value: >60        The eGFR has been calculated using the MDRD equation. This calculation has not been validated in all clinical situations. eGFR's persistently <60 mL/min signify possible Chronic Kidney Disease. 11/05/2010 1319    Lipid Panel     Component Value Date/Time   CHOL 243* 03/08/2013 1444   TRIG 207.0* 03/08/2013 1444   HDL 37.40* 03/08/2013 1444   CHOLHDL 6 03/08/2013 1444   VLDL 41.4* 03/08/2013 1444    CBC    Component Value Date/Time   WBC 10.0 11/05/2010 1319   RBC 4.98 11/05/2010 1319   HGB 14.3 11/05/2010 1319   HCT 42.1 11/05/2010 1319   PLT 311 11/05/2010 1319   MCV 84.5 11/05/2010 1319   MCH 28.7 11/05/2010 1319   MCHC 34.0 11/05/2010 1319   RDW 13.5 11/05/2010 1319    Hgb A1C No results found for this basename: HGBA1C       Assessment & Plan:

## 2014-03-05 ENCOUNTER — Emergency Department
Admission: EM | Admit: 2014-03-05 | Discharge: 2014-03-05 | Disposition: A | Discharge status: Home / Self Care | Payer: 59 | Source: Home / Self Care | Attending: Family Medicine | Admitting: Family Medicine

## 2014-03-05 ENCOUNTER — Encounter: Payer: Self-pay | Admitting: Emergency Medicine

## 2014-03-05 DIAGNOSIS — J069 Acute upper respiratory infection, unspecified: Secondary | ICD-10-CM

## 2014-03-05 LAB — COMPREHENSIVE METABOLIC PANEL
ALT: 12 U/L (ref 0–35)
AST: 13 U/L (ref 0–37)
Albumin: 4.2 g/dL (ref 3.5–5.2)
Alkaline Phosphatase: 75 U/L (ref 39–117)
BUN: 14 mg/dL (ref 6–23)
CO2: 27 mEq/L (ref 19–32)
Calcium: 9.6 mg/dL (ref 8.4–10.5)
Chloride: 100 mEq/L (ref 96–112)
Creat: 0.88 mg/dL (ref 0.50–1.10)
Glucose, Bld: 94 mg/dL (ref 70–99)
Potassium: 3.9 mEq/L (ref 3.5–5.3)
Sodium: 139 mEq/L (ref 135–145)
Total Bilirubin: 0.4 mg/dL (ref 0.2–1.2)
Total Protein: 7.1 g/dL (ref 6.0–8.3)

## 2014-03-05 LAB — LIPID PANEL
Cholesterol: 193 mg/dL (ref 0–200)
HDL: 48 mg/dL (ref >39)
LDL Cholesterol: 96 mg/dL (ref 0–99)
Total CHOL/HDL Ratio: 4 Ratio
Triglycerides: 245 mg/dL — ABNORMAL HIGH (ref <150)
VLDL: 49 mg/dL — ABNORMAL HIGH (ref 0–40)

## 2014-03-05 LAB — TSH: TSH: 0.57 u[IU]/mL (ref 0.350–4.500)

## 2014-03-05 MED ORDER — TRIAMCINOLONE ACETONIDE 40 MG/ML IJ SUSP
40.0000 mg | Freq: Once | INTRAMUSCULAR | Status: AC
  Administered 2014-03-05: 40 mg via INTRAMUSCULAR

## 2014-03-05 MED ORDER — BENZONATATE 200 MG PO CAPS: 200.0000 mg | ORAL_CAPSULE | Freq: Every day | ORAL | Status: DC

## 2014-03-05 MED ORDER — CEFDINIR 300 MG PO CAPS: 300.0000 mg | ORAL_CAPSULE | Freq: Two times a day (BID) | ORAL | Status: DC

## 2014-03-05 NOTE — Discharge Instructions (Signed)
Take plain Mucinex (1200 mg guaifenesin) twice daily for cough and congestion.  May add Sudafed for sinus congestion.   Increase fluid intake, rest. °May use Afrin nasal spray (or generic oxymetazoline) twice daily for about 5 days.  Also recommend using saline nasal spray several times daily and saline nasal irrigation (AYR is a common brand) °Try warm salt water gargles for sore throat.  °Stop all antihistamines for now, and other non-prescription cough/cold preparations. °Follow-up with family doctor if not improving 7 to 10 days.  °

## 2014-03-05 NOTE — ED Notes (Signed)
Fever, cough, and body aches since Monday; has taken Advil

## 2014-03-05 NOTE — ED Provider Notes (Signed)
CSN: [id number]     Arrival date & time 03/05/14  1351 History   First MD Initiated Contact with Patient 03/05/14 1416     Chief Complaint  Patient presents with  . Fever  . Cough      HPI Comments: Patient complains of onset of URI symptoms five days ago with sinus congestion, sweats/chills, sore throat, and fatigue.  This was followed by a non-productive cough and tightness in her anterior chest.  The history is provided by the patient.    Past Medical History  Diagnosis Date  . Hypertension   . Hyperlipidemia   . Depression   . Myasthenia gravis 2008   Past Surgical History  Procedure Laterality Date  . Cesarean section    . Thymactomy  2009  . Reconstruction adbominal wall    . Abdominal hysterectomy     Family History  Problem Relation Age of Onset  . Cancer Mother 79    breast CA  . Hypertension Mother   . Diabetes Mother   . Cancer Father 22    colon CA  . Alcohol abuse Father   . Hypertension Father   . Stroke Father   . Heart disease Brother 34    MI   History  Substance Use Topics  . Smoking status: Never Smoker   . Smokeless tobacco: Not on file  . Alcohol Use: No   OB History   Grav Para Term Preterm Abortions TAB SAB Ect Mult Living                 Review of Systems + sore throat + cough No pleuritic pain ? wheezing + nasal congestion + post-nasal drainage No sinus pain/pressure No itchy/red eyes No earache No hemoptysis No SOB + fever, + chills No nausea No vomiting No abdominal pain No diarrhea No urinary symptoms No skin rash + fatigue + myalgias + headache Used OTC meds without relief  Allergies  Penicillins  Home Medications   Current Outpatient Rx  Name  Route  Sig  Dispense  Refill  . benzonatate (TESSALON) 200 MG capsule   Oral   Take 1 capsule (200 mg total) by mouth at bedtime. Take as needed for cough   12 capsule   0   . cefdinir (OMNICEF) 300 MG capsule   Oral   Take 1 capsule (300 mg total) by mouth 2  (two) times daily.   20 capsule   0   . citalopram (CELEXA) 40 MG tablet   Oral   Take 1 tablet (40 mg total) by mouth daily.   90 tablet   0   . hydrochlorothiazide (HYDRODIURIL) 25 MG tablet   Oral   Take 1 tablet (25 mg total) by mouth daily.   90 tablet   0   . pravastatin (PRAVACHOL) 20 MG tablet      TAKE 1 TABLET BY MOUTH DAILY.   90 tablet   1    BP 145/87  Pulse 76  Temp(Src) 98.4 F (36.9 C) (Oral)  Resp 16  Ht 5\' 7"  (1.702 m)  Wt 213 lb 8 oz (96.843 kg)  BMI 33.43 kg/m2  SpO2 97% Physical Exam Nursing notes and Vital Signs reviewed. Appearance:  Patient appears stated age, and in no acute distress.  Patient is obese (BMI 33.4) Eyes:  Pupils are equal, round, and reactive to light and accomodation.  Extraocular movement is intact.  Conjunctivae are not inflamed  Ears:  Canals normal.  Tympanic membranes normal.  Nose:  Mildly congested turbinates.  No sinus tenderness.    Pharynx:  Normal Neck:  Supple.  Tender enlarged posterior nodes palpated. Lungs:  Clear to auscultation.  Breath sounds are equal.  Chest:  Distinct tenderness to palpation over the mid-sternum.  Heart:  Regular rate and rhythm without murmurs, rubs, or gallops.  Abdomen:  Nontender without masses or hepatosplenomegaly.  Bowel sounds are present.  No CVA or flank tenderness.  Extremities:  No edema.  No calf tenderness Skin:  No rash present.   ED Course  Procedures  none      MDM   1. Acute upper respiratory infections of unspecified site    Patient requests injectable steroid:  Administered Kenalog 40mg  IM. Begin Omnicef.  Prescription written for Benzonatate [facility]) to take at bedtime for night-time cough.  Take plain Mucinex (1200 mg guaifenesin) twice daily for cough and congestion.  May add Sudafed for sinus congestion.   Increase fluid intake, rest. May use Afrin nasal spray (or generic oxymetazoline) twice daily for about 5 days.  Also recommend using saline nasal  spray several times daily and saline nasal irrigation (AYR is a common brand) Try warm salt water gargles for sore throat.  Stop all antihistamines for now, and other non-prescription cough/cold preparations. Follow-up with family doctor if not improving 7 to 10 days.   Kandra Nicolas, MD 03/05/14 [phone]

## 2014-03-07 ENCOUNTER — Telehealth: Payer: Self-pay | Admitting: Family Medicine

## 2014-03-07 NOTE — Telephone Encounter (Signed)
Relevant patient education assigned to patient using Emmi. ° °

## 2014-03-08 ENCOUNTER — Telehealth: Payer: Self-pay | Admitting: Family Medicine

## 2014-03-08 ENCOUNTER — Telehealth: Payer: Self-pay | Admitting: *Deleted

## 2014-03-08 NOTE — Telephone Encounter (Signed)
Ok to write note as patient requests.

## 2014-03-08 NOTE — Telephone Encounter (Signed)
Pt was in for an acute visit on 03/04/14 and would like to pick a medical necessity letter from Dr. Deborra Medina stating she is healthy to start a new diet regime.  She would like to pick up on Wednesday afternoon 03/09/14 as her appointment with the diet center is Thursday morning.  Best number to call pt for pickup is [phone] / lt

## 2014-03-10 NOTE — Telephone Encounter (Signed)
Spoke to pt and informed her letter is available to pickup at the front desk; letter was also sent through [place] at pt's request

## 2014-03-17 ENCOUNTER — Telehealth: Payer: Self-pay | Admitting: *Deleted

## 2014-03-17 DIAGNOSIS — J329 Chronic sinusitis, unspecified: Secondary | ICD-10-CM

## 2014-03-17 NOTE — Telephone Encounter (Signed)
Spoke to pt, who states that she would prefer a referral to ENT. I informed her that the order would be entered and to await a call from [facility] with appt details.

## 2014-03-17 NOTE — Telephone Encounter (Signed)
Pt contacted office stating that she snores really loudly and is unable to blow her nose. She states her nose "feels like it has a trapped door." She is questioning if she needs an OV or referral to ENT. Last ov was establish 02/2014. pls advise

## 2014-03-17 NOTE — Telephone Encounter (Signed)
She can either be seen here by another provider today or we can refer to ENT.  That is her choice.  Please let me know.

## 2014-03-31 ENCOUNTER — Other Ambulatory Visit: Payer: Self-pay | Admitting: Family Medicine

## 2014-05-02 ENCOUNTER — Other Ambulatory Visit: Payer: Self-pay | Admitting: Family Medicine

## 2014-05-07 ENCOUNTER — Emergency Department
Admission: EM | Admit: 2014-05-07 | Discharge: 2014-05-07 | Disposition: A | Discharge status: Home / Self Care | Payer: 59 | Source: Home / Self Care | Attending: Family Medicine | Admitting: Family Medicine

## 2014-05-07 ENCOUNTER — Encounter: Payer: Self-pay | Admitting: Emergency Medicine

## 2014-05-07 DIAGNOSIS — J069 Acute upper respiratory infection, unspecified: Secondary | ICD-10-CM

## 2014-05-07 DIAGNOSIS — B9789 Other viral agents as the cause of diseases classified elsewhere: Principal | ICD-10-CM

## 2014-05-07 MED ORDER — GUAIFENESIN-CODEINE 100-10 MG/5ML PO SOLN: ORAL | Status: DC

## 2014-05-07 MED ORDER — CEFDINIR 300 MG PO CAPS: 300.0000 mg | ORAL_CAPSULE | Freq: Two times a day (BID) | ORAL | Status: DC

## 2014-05-07 MED ORDER — TRIAMCINOLONE ACETONIDE 40 MG/ML IJ SUSP
40.0000 mg | Freq: Once | INTRAMUSCULAR | Status: AC
  Administered 2014-05-07: 40 mg via INTRAMUSCULAR

## 2014-05-07 NOTE — ED Notes (Signed)
Pt has experienced sinus pressure and congestion for 1 week.  She states her chest feels "raw" from coughing up mucous described as dark green yellow.  Pt says ears have been sore and she wakes up hoarse.

## 2014-05-07 NOTE — Discharge Instructions (Signed)
Take plain Mucinex (1200 mg guaifenesin) twice daily for cough and congestion.  Increase fluid intake, rest. °May use Afrin nasal spray (or generic oxymetazoline) twice daily for about 5 days.  Also recommend using saline nasal spray several times daily and saline nasal irrigation (AYR is a common brand) °Try warm salt water gargles for sore throat.  °Stop all antihistamines for now, and other non-prescription cough/cold preparations. °  °Follow-up with family doctor if not improving 7 to 10 days.  °

## 2014-05-07 NOTE — ED Provider Notes (Signed)
CSN: [id number]     Arrival date & time 05/07/14  0941 History   First MD Initiated Contact with Patient 05/07/14 0957     Chief Complaint  Patient presents with  . Sinusitis  . Cough      HPI Comments: Five days ago patient developed fatigue, sweats, mild sore throat, sinus congestion and cough.  The history is provided by the patient.    Past Medical History  Diagnosis Date  . Hypertension   . Hyperlipidemia   . Depression   . Myasthenia gravis 2008   Past Surgical History  Procedure Laterality Date  . Cesarean section    . Thymactomy  2009  . Reconstruction adbominal wall    . Abdominal hysterectomy     Family History  Problem Relation Age of Onset  . Cancer Mother 55    breast CA  . Hypertension Mother   . Diabetes Mother   . Cancer Father 71    colon CA  . Alcohol abuse Father   . Hypertension Father   . Stroke Father   . Heart disease Brother 12    MI   History  Substance Use Topics  . Smoking status: Never Smoker   . Smokeless tobacco: Not on file  . Alcohol Use: No   OB History   Grav Para Term Preterm Abortions TAB SAB Ect Mult Living                 Review of Systems + sore throat + cough + hoarseness No pleuritic pain No wheezing + nasal congestion + post-nasal drainage + sinus pain/pressure No itchy/red eyes No earache No hemoptysis No SOB + fever, + chills No nausea No vomiting No abdominal pain No diarrhea No urinary symptoms No skin rash + fatigue + myalgias + headache Used OTC meds without relief  Allergies  Penicillins  Home Medications   Prior to Admission medications   Medication Sig Start Date End Date Taking? Authorizing Provider  benzonatate (TESSALON) 200 MG capsule Take 1 capsule (200 mg total) by mouth at bedtime. Take as needed for cough 03/05/14   Kandra Nicolas, MD  cefdinir (OMNICEF) 300 MG capsule Take 1 capsule (300 mg total) by mouth 2 (two) times daily. 03/05/14   Kandra Nicolas, MD  citalopram  (CELEXA) 40 MG tablet TAKE 1 TABLET BY MOUTH DAILY. 05/02/14   Lucille Passy, MD  hydrochlorothiazide (HYDRODIURIL) 25 MG tablet Take 1 tablet (25 mg total) by mouth daily. 03/04/14   [name], NP  pravastatin (PRAVACHOL) 20 MG tablet TAKE 1 TABLET BY MOUTH DAILY.    Lucille Passy, MD   BP 152/79  Pulse 69  Temp(Src) 97.8 F (36.6 C) (Oral)  Ht 5\' 7"  (1.702 m)  Wt 190 lb (86.183 kg)  BMI 29.75 kg/m2  SpO2 98% Physical Exam Nursing notes and Vital Signs reviewed. Appearance:  Patient appears healthy, stated age, and in no acute distress Eyes:  Pupils are equal, round, and reactive to light and accomodation.  Extraocular movement is intact.  Conjunctivae are not inflamed  Ears:  Canals normal.  Tympanic membranes normal.  Nose:  Mildly congested turbinates.   Maxillary sinus tenderness is present.  Pharynx:  Normal Neck:  Supple.  Tender posterior nodes are palpated bilaterally  Lungs:  Clear to auscultation.  Breath sounds are equal. Chest:  Distinct tenderness to palpation over the mid-sternum.   Heart:  Regular rate and rhythm without murmurs, rubs, or gallops.  Abdomen:  Nontender  without masses or hepatosplenomegaly.  Bowel sounds are present.  No CVA or flank tenderness.  Extremities:  No edema.  No calf tenderness Skin:  No rash present.    ED Course  Procedures  none   MDM   1. Viral URI with cough; ?sinusitis     Kenalog 40mg  IM.  Robitussin AC at bedtime.  Begin Omnicef. Take plain Mucinex (1200 mg guaifenesin) twice daily for cough and congestion.  Increase fluid intake, rest. May use Afrin nasal spray (or generic oxymetazoline) twice daily for about 5 days.  Also recommend using saline nasal spray several times daily and saline nasal irrigation (AYR is a common brand) Try warm salt water gargles for sore throat.  Stop all antihistamines for now, and other non-prescription cough/cold preparations.   Follow-up with family doctor if not improving 7 to 10 days.     Kandra Nicolas, MD 05/11/14 2256

## 2014-05-17 ENCOUNTER — Ambulatory Visit (INDEPENDENT_AMBULATORY_CARE_PROVIDER_SITE_OTHER): Payer: 59 | Admitting: Family Medicine

## 2014-05-17 ENCOUNTER — Encounter: Payer: Self-pay | Admitting: Family Medicine

## 2014-05-17 ENCOUNTER — Ambulatory Visit (INDEPENDENT_AMBULATORY_CARE_PROVIDER_SITE_OTHER)
Admission: RE | Admit: 2014-05-17 | Discharge: 2014-05-17 | Disposition: A | Discharge status: Home / Self Care | Payer: 59 | Source: Ambulatory Visit | Attending: Family Medicine | Admitting: Family Medicine

## 2014-05-17 VITALS — BP 122/88 | HR 81 | Temp 97.9°F | Ht 66.5 in | Wt 221.5 lb

## 2014-05-17 DIAGNOSIS — R05 Cough: Secondary | ICD-10-CM

## 2014-05-17 DIAGNOSIS — R059 Cough, unspecified: Secondary | ICD-10-CM

## 2014-05-17 MED ORDER — ALBUTEROL SULFATE HFA 108 (90 BASE) MCG/ACT IN AERS: 2.0000 | INHALATION_SPRAY | Freq: Four times a day (QID) | RESPIRATORY_TRACT | Status: DC | PRN

## 2014-05-17 MED ORDER — HYDROCODONE-HOMATROPINE 5-1.5 MG/5ML PO SYRP: 5.0000 mL | ORAL_SOLUTION | Freq: Every evening | ORAL | Status: DC | PRN

## 2014-05-17 NOTE — Patient Instructions (Signed)
Use the cough syrup at night and the inhaler during the day.  The cough should slowly get better and your sputum should stay clear. Take care.

## 2014-05-17 NOTE — Progress Notes (Signed)
Pre visit review using our clinic review tool, if applicable. No additional management support is needed unless otherwise documented below in the visit note.  About 2 weeks ago, she started feeling poorly, seen at [facility], given abx and steroid shot.  Not fully improved.  Now with more ear pain, cough and raw sensation in chest.  Dark sputum prev, now clear sputum.  Taking mucinex and otc cough meds.  Unclear if she has fevers, has been taking advil.  Felt like she had a fever.   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  Not on immunosuppressive meds long term.    Meds, vitals, and allergies reviewed.   ROS: See HPI.  Otherwise, noncontributory.  GEN: nad, alert and oriented HEENT: mucous membranes moist, tm w/o erythema, nasal exam w/o erythema, clear discharge noted,  OP with cobblestoning NECK: supple w/o LA CV: rrr.   PULM: ctab, no inc wob EXT: no edema SKIN: no acute rash

## 2014-05-17 NOTE — Assessment & Plan Note (Signed)
No sign of PNA on exam or CXR, reviewed.  Would use SABA prn during the day for cough and then hydrocodone syrup prn qhs, sedation caution.  No need for abx at this point.  Should slowly resolve.  F/u prn.

## 2014-06-29 ENCOUNTER — Other Ambulatory Visit: Payer: Self-pay | Admitting: Internal Medicine

## 2014-07-21 ENCOUNTER — Other Ambulatory Visit: Payer: Self-pay | Admitting: Family Medicine

## 2014-07-25 ENCOUNTER — Other Ambulatory Visit: Payer: Self-pay | Admitting: Family Medicine

## 2014-08-11 ENCOUNTER — Ambulatory Visit (INDEPENDENT_AMBULATORY_CARE_PROVIDER_SITE_OTHER): Payer: 59 | Admitting: Internal Medicine

## 2014-08-11 ENCOUNTER — Encounter: Payer: Self-pay | Admitting: Internal Medicine

## 2014-08-11 VITALS — BP 130/84 | HR 79 | Temp 98.4°F | Wt 216.0 lb

## 2014-08-11 DIAGNOSIS — J209 Acute bronchitis, unspecified: Secondary | ICD-10-CM

## 2014-08-11 MED ORDER — AZITHROMYCIN 250 MG PO TABS: ORAL_TABLET | ORAL | Status: DC

## 2014-08-11 MED ORDER — HYDROCODONE-HOMATROPINE 5-1.5 MG/5ML PO SYRP: 5.0000 mL | ORAL_SOLUTION | Freq: Three times a day (TID) | ORAL | Status: DC | PRN

## 2014-08-11 MED ORDER — ALBUTEROL SULFATE HFA 108 (90 BASE) MCG/ACT IN AERS: 2.0000 | INHALATION_SPRAY | Freq: Four times a day (QID) | RESPIRATORY_TRACT | Status: DC | PRN

## 2014-08-11 MED ORDER — METHYLPREDNISOLONE ACETATE 80 MG/ML IJ SUSP
80.0000 mg | Freq: Once | INTRAMUSCULAR | Status: AC
  Administered 2014-08-11: 80 mg via INTRAMUSCULAR

## 2014-08-11 NOTE — Progress Notes (Signed)
HPI  Pt presents to the clinic today with c/o cough, chest congestion and sore throat. She reports this started  1 week ago. She denies fever but has had chills and body aches. She is coughing of thick green mucous. She does feel tightness in her chest. The cough is worse at night. She has tried Sudafed and Mucinex OTC. She has not had sick contacts that she is aware of.  Review of Systems      Past Medical History  Diagnosis Date  . Hypertension   . Hyperlipidemia   . Depression   . Myasthenia gravis 2008    Family History  Problem Relation Age of Onset  . Cancer Mother 61    breast CA  . Hypertension Mother   . Diabetes Mother   . Cancer Father 58    colon CA  . Alcohol abuse Father   . Hypertension Father   . Stroke Father   . Heart disease Brother 36    MI    History   Social History  . Marital Status: Married    Spouse Name: N/A    Number of Children: N/A  . Years of Education: N/A   Occupational History  . Not on file.   Social History Main Topics  . Smoking status: Never Smoker   . Smokeless tobacco: Not on file  . Alcohol Use: No  . Drug Use: No  . Sexual Activity: Not on file   Other Topics Concern  . Not on file   Social History Narrative   Married, twin daughters born 87, son born 1988   Works at [employer] in Engineer, mining.    Allergies  Allergen Reactions  . Penicillins     Rash- has tolerated cephalosporins     Constitutional: Positive headache, fatigue. Denies fever or abrupt weight changes.  HEENT:  Positive sore throat. Denies eye redness, eye pain, pressure behind the eyes, facial pain, nasal congestion, ear pain, ringing in the ears, wax buildup, runny nose or bloody nose. Respiratory: Positive cough. Denies difficulty breathing or shortness of breath.  Cardiovascular: Denies chest pain, chest tightness, palpitations or swelling in the hands or feet.   No other specific complaints in a complete review of systems (except as listed in HPI  above).  Objective:   BP 130/84  Pulse 79  Temp(Src) 98.4 F (36.9 C) (Oral)  Wt 216 lb (97.977 kg)  SpO2 97% Wt Readings from Last 3 Encounters:  08/11/14 216 lb (97.977 kg)  05/17/14 221 lb 8 oz (100.472 kg)  05/07/14 190 lb (86.183 kg)     General: Appears her stated age, well developed, well nourished in NAD. HEENT: Head: normal shape and size; Eyes: sclera white, no icterus, conjunctiva pink, PERRLA and EOMs intact; Ears: Tm's gray and intact, normal light reflex; Nose: mucosa pink and moist, septum midline; Throat/Mouth: + PND. Teeth present, mucosa erythematous and moist, no exudate noted, no lesions or ulcerations noted.  Neck: Mild cervical lymphadenopathy. Neck supple, trachea midline. No massses, lumps or thyromegaly present.  Cardiovascular: Normal rate and rhythm. S1,S2 noted.  No murmur, rubs or gallops noted. No JVD or BLE edema. No carotid bruits noted. Pulmonary/Chest: Normal effort and scattered rhonchi in the bases. No respiratory distress. No wheezes, rales or ronchi noted.      Assessment & Plan:   Acute bronchitis:  Get some rest and drink plenty of water Do salt water gargles for the sore throat eRx for Azithromax x 5 days eRx for Hycodan cough  syrup Refilled albuterol 80 mg Depo IM today  RTC as needed or if symptoms persist.

## 2014-08-11 NOTE — Progress Notes (Signed)
Pre visit review using our clinic review tool, if applicable. No additional management support is needed unless otherwise documented below in the visit note. 

## 2014-08-11 NOTE — Addendum Note (Signed)
Addended by: Lurlean Nanny on: 08/11/2014 04:38 PM   Modules accepted: Orders

## 2014-08-11 NOTE — Patient Instructions (Signed)

## 2014-08-17 ENCOUNTER — Other Ambulatory Visit: Payer: Self-pay | Admitting: Family Medicine

## 2014-09-20 ENCOUNTER — Other Ambulatory Visit: Payer: Self-pay | Admitting: Family Medicine

## 2014-09-27 ENCOUNTER — Telehealth: Payer: Self-pay | Admitting: Family Medicine

## 2014-09-27 DIAGNOSIS — R29898 Other symptoms and signs involving the musculoskeletal system: Secondary | ICD-10-CM

## 2014-09-27 NOTE — Telephone Encounter (Signed)
What symptoms is she having?

## 2014-09-27 NOTE — Telephone Encounter (Signed)
Pt calling to get referral to [facility]. Pt states she used to see them in the past for symptoms and those symptoms have returned. Pt called their office to make appt, but the office told her she had to be referred. Please advise! Thank you!

## 2014-09-27 NOTE — Telephone Encounter (Signed)
I called & spoke w/ pt. Pt having weakness in arms & legs, double vision. Started in her eyes and progress to her arms and legs about 8 years ago. Pt has been thru numerous procedures.

## 2014-09-27 NOTE — Telephone Encounter (Signed)
Referral placed.

## 2014-10-07 ENCOUNTER — Ambulatory Visit: Payer: Self-pay | Admitting: Diagnostic Neuroimaging

## 2014-10-11 ENCOUNTER — Ambulatory Visit: Payer: 59 | Admitting: Family Medicine

## 2014-10-11 DIAGNOSIS — Z0289 Encounter for other administrative examinations: Secondary | ICD-10-CM

## 2014-10-18 ENCOUNTER — Other Ambulatory Visit: Payer: Self-pay | Admitting: Family Medicine

## 2014-10-24 ENCOUNTER — Other Ambulatory Visit: Payer: Self-pay | Admitting: Family Medicine

## 2014-10-24 ENCOUNTER — Ambulatory Visit: Payer: 59 | Admitting: Family Medicine

## 2014-10-24 MED ORDER — CITALOPRAM HYDROBROMIDE 40 MG PO TABS: ORAL_TABLET | ORAL | Status: DC

## 2014-10-31 ENCOUNTER — Ambulatory Visit (INDEPENDENT_AMBULATORY_CARE_PROVIDER_SITE_OTHER): Payer: 59 | Admitting: Family Medicine

## 2014-10-31 ENCOUNTER — Encounter: Payer: Self-pay | Admitting: Family Medicine

## 2014-10-31 VITALS — BP 126/82 | HR 82 | Temp 98.1°F | Wt 212.8 lb

## 2014-10-31 DIAGNOSIS — I10 Essential (primary) hypertension: Secondary | ICD-10-CM

## 2014-10-31 DIAGNOSIS — G7000 Myasthenia gravis without (acute) exacerbation: Secondary | ICD-10-CM

## 2014-10-31 DIAGNOSIS — E785 Hyperlipidemia, unspecified: Secondary | ICD-10-CM

## 2014-10-31 DIAGNOSIS — F329 Major depressive disorder, single episode, unspecified: Secondary | ICD-10-CM

## 2014-10-31 DIAGNOSIS — F32A Depression, unspecified: Secondary | ICD-10-CM

## 2014-10-31 MED ORDER — ALPRAZOLAM 0.25 MG PO TABS: 0.2500 mg | ORAL_TABLET | Freq: Every evening | ORAL | Status: DC | PRN

## 2014-10-31 NOTE — Assessment & Plan Note (Signed)
Deteriorated due to acute stressors. >25 min spent with patient, at least half of which was spent on counseling insomnia.  The problem of recurrent insomnia is discussed. Avoidance of caffeine sources is strongly encouraged. Sleep hygiene issues are reviewed. The use of sedative hypnotics for temporary relief is appropriate; we discussed the addictive nature of these drugs, and a one-time only prescription for prn use of a hypnotic is given, to use no more than 3 times per week for 2-3 weeks.

## 2014-10-31 NOTE — Progress Notes (Signed)
Pre visit review using our clinic review tool, if applicable. No additional management support is needed unless otherwise documented below in the visit note. 

## 2014-10-31 NOTE — Progress Notes (Signed)
Subjective:    Patient ID: Regina Huber, female    DOB: 15-Dec-1961, 53 y.o.   MRN: [id number]  HPI  53 yo here for follow up. I have not seen her since she established care with me last year.  MG- diagnosed in 2008, s/p thymectomy.  Still has not re established with a neurologist.  Used to see Dr. Erling Cruz but he retired.  HTN-  Has been stable on HCTZ 25 mg daily. She has no h/o CAD.  No h/o palpitations.  Does have some LE edema, always has had some. Lab Results  Component Value Date   CREATININE 0.88 03/04/2014   HLD- on pravachol 20 mg daily.  Denies any myalgias.  Lab Results  Component Value Date   CHOL 193 03/04/2014   HDL 48 03/04/2014   LDLCALC 96 03/04/2014   LDLDIRECT 177.4 03/08/2013   TRIG 245* 03/04/2014   CHOLHDL 4.0 03/04/2014     Depression- has been on celexa 40 mg daily for years.  Going through a divorce and has had more work stressors.  Not sleeping as well but overall feels "healthy."  Denies feeling depressed- just "stressed."  No SI or HI.  Patient Active Problem List   Diagnosis Date Noted  . Hypertension   . Hyperlipidemia   . Depression    Past Medical History  Diagnosis Date  . Hypertension   . Hyperlipidemia   . Depression   . Myasthenia gravis 2008   Past Surgical History  Procedure Laterality Date  . Cesarean section    . Thymactomy  2009  . Reconstruction adbominal wall    . Abdominal hysterectomy     History  Substance Use Topics  . Smoking status: Never Smoker   . Smokeless tobacco: Not on file  . Alcohol Use: No   Family History  Problem Relation Age of Onset  . Cancer Mother 104    breast CA  . Hypertension Mother   . Diabetes Mother   . Cancer Father 89    colon CA  . Alcohol abuse Father   . Hypertension Father   . Stroke Father   . Heart disease Brother 41    MI   Allergies  Allergen Reactions  . Penicillins     Rash- has tolerated cephalosporins   Current Outpatient Prescriptions on File Prior to Visit   Medication Sig Dispense Refill  . citalopram (CELEXA) 40 MG tablet TAKE 1 TABLET BY MOUTH DAILY. 30 tablet 0  . hydrochlorothiazide (HYDRODIURIL) 25 MG tablet TAKE 1 TABLET BY MOUTH DAILY. 90 tablet PRN  . pravastatin (PRAVACHOL) 20 MG tablet TAKE 1 TABLET BY MOUTH ONCE DAILY 90 tablet 0   No current facility-administered medications on file prior to visit.   The PMH, PSH, Social History, Family History, Medications, and allergies have been reviewed in [facility], and have been updated if relevant.   Review of Systems  Constitutional: Positive for fatigue.  Psychiatric/Behavioral: Positive for sleep disturbance. Negative for suicidal ideas and dysphoric mood.   See HPI      Objective:   Physical Exam BP 126/82 mmHg  Pulse 82  Temp(Src) 98.1 F (36.7 C) (Oral)  Wt 212 lb 12 oz (96.503 kg)  SpO2 95%  General:  Well-developed,well-nourished,in no acute distress; alert,appropriate and cooperative throughout examination Head:  normocephalic and atraumatic.   Eyes:  vision grossly intact, pupils equal, pupils round, and pupils reactive to light.   Ears:  R ear normal and L ear normal.   Nose:  no external deformity.   Mouth:  good dentition.   Neck:  No deformities, masses, or tenderness noted. Lungs:  Normal respiratory effort, chest expands symmetrically. Lungs are clear to auscultation, no crackles or wheezes. Heart:  Normal rate and regular rhythm. S1 and S2 normal without gallop, murmur, click, rub or other extra sounds. Msk:  No deformity or scoliosis noted of thoracic or lumbar spine.   Extremities:  No clubbing, cyanosis, edema, or deformity noted with normal full range of motion of all joints.   Neurologic:  alert & oriented X3 and gait normal.   Skin:  Intact without suspicious lesions or rashes Cervical Nodes:  No lymphadenopathy noted Axillary Nodes:  No palpable lymphadenopathy Psych:  Cognition and judgment appear intact. Alert and cooperative with normal attention span  and concentration. No apparent delusions, illusions, hallucinations      Assessment & Plan:

## 2014-10-31 NOTE — Assessment & Plan Note (Signed)
Refer to neuro 

## 2014-10-31 NOTE — Patient Instructions (Signed)
Great to see you. We are starting low dose xanax as needed at bedtime.

## 2014-10-31 NOTE — Assessment & Plan Note (Signed)
Well controlled on current rx. No changes made. 

## 2014-10-31 NOTE — Assessment & Plan Note (Signed)
Well controlled 

## 2014-11-02 ENCOUNTER — Other Ambulatory Visit: Payer: Self-pay | Admitting: Family Medicine

## 2014-11-28 ENCOUNTER — Other Ambulatory Visit: Payer: Self-pay | Admitting: Family Medicine

## 2014-11-28 NOTE — Telephone Encounter (Signed)
Pt requesting medication refill. Last f/u appt 10/2014. pls advise 

## 2014-11-28 NOTE — Telephone Encounter (Signed)
Rx called in to requested pharmacy 

## 2014-12-22 ENCOUNTER — Emergency Department
Admission: EM | Admit: 2014-12-22 | Discharge: 2014-12-22 | Disposition: A | Discharge status: Home / Self Care | Payer: 59 | Source: Home / Self Care | Attending: Family Medicine | Admitting: Family Medicine

## 2014-12-22 ENCOUNTER — Encounter: Payer: Self-pay | Admitting: Emergency Medicine

## 2014-12-22 DIAGNOSIS — G7000 Myasthenia gravis without (acute) exacerbation: Secondary | ICD-10-CM

## 2014-12-22 DIAGNOSIS — J209 Acute bronchitis, unspecified: Secondary | ICD-10-CM

## 2014-12-22 MED ORDER — AZITHROMYCIN 250 MG PO TABS: 250.0000 mg | ORAL_TABLET | Freq: Every day | ORAL | Status: DC

## 2014-12-22 MED ORDER — HYDROCODONE-HOMATROPINE 5-1.5 MG/5ML PO SYRP: 5.0000 mL | ORAL_SOLUTION | Freq: Four times a day (QID) | ORAL | Status: DC | PRN

## 2014-12-22 MED ORDER — IPRATROPIUM BROMIDE 0.06 % NA SOLN: 2.0000 | Freq: Four times a day (QID) | NASAL | Status: DC

## 2014-12-22 MED ORDER — METHYLPREDNISOLONE ACETATE 80 MG/ML IJ SUSP
80.0000 mg | Freq: Once | INTRAMUSCULAR | Status: AC
  Administered 2014-12-22: 80 mg via INTRAMUSCULAR

## 2014-12-22 NOTE — ED Notes (Signed)
Cough x 1 week, dark mucus, hx bronchitis

## 2014-12-22 NOTE — ED Provider Notes (Signed)
Regina Huber is a 54 y.o. female who presents to Urgent Care today for cough. Patient has a one-week history of productive cough sweats chest soreness. No fevers or chills trouble breathing or shortness of breath or wheezing. Patient states her current symptoms are very similar to previous episodes of bronchitis. She typically receives a steroid shot and azithromycin in improves. No vomiting or diarrhea. Chest soreness is mild central nonexertional and worse with inspiration and cough. She's tried Advil and Sudafed which helps a bit. She has myasthenia gravis however this is well controlled without medications after thymectomy.   Past Medical History  Diagnosis Date  . Hypertension   . Hyperlipidemia   . Depression   . Myasthenia gravis 2008   Past Surgical History  Procedure Laterality Date  . Cesarean section    . Thymactomy  2009  . Reconstruction adbominal wall    . Abdominal hysterectomy     History  Substance Use Topics  . Smoking status: Never Smoker   . Smokeless tobacco: Not on file  . Alcohol Use: No   ROS as above Medications: Current Facility-Administered Medications  Medication Dose Route Frequency Provider Last Rate Last Dose  . methylPREDNISolone acetate (DEPO-MEDROL) injection 80 mg  80 mg Intramuscular Once Gregor Hams, MD       Current Outpatient Prescriptions  Medication Sig Dispense Refill  . ALPRAZolam (XANAX) 0.25 MG tablet TAKE 1 TABLET BY MOUTH ONCE DAILY AT BEDTIME AS NEDED FOR ANXIETY 30 tablet PRN  . azithromycin (ZITHROMAX) 250 MG tablet Take 1 tablet (250 mg total) by mouth daily. Take first 2 tablets together, then 1 every day until finished. 6 tablet 0  . citalopram (CELEXA) 40 MG tablet TAKE 1 TABLET BY MOUTH DAILY. 30 tablet 2  . hydrochlorothiazide (HYDRODIURIL) 25 MG tablet TAKE 1 TABLET BY MOUTH DAILY. 90 tablet PRN  . HYDROcodone-homatropine (HYCODAN) 5-1.5 MG/5ML syrup Take 5 mLs by mouth every 6 (six) hours as needed for cough. 120 mL 0  .  ipratropium (ATROVENT) 0.06 % nasal spray Place 2 sprays into both nostrils 4 (four) times daily. 15 mL 1  . pravastatin (PRAVACHOL) 20 MG tablet TAKE 1 TABLET BY MOUTH ONCE DAILY **NEED APPOINTMENT FOR ADDITIONAL REFILLS** 90 tablet PRN   Allergies  Allergen Reactions  . Penicillins     Rash- has tolerated cephalosporins     Exam:  BP 144/80 mmHg  Pulse 77  Temp(Src) 98 F (36.7 C) (Oral)  Ht 5\' 7"  (1.702 m)  Wt 203 lb (92.08 kg)  BMI 31.79 kg/m2  SpO2 97% Gen: Well NAD HEENT: EOMI,  MMM posterior pharynx with cobblestoning. Normal tympanic membranes bilaterally. Clear nasal discharge. Lungs: Normal work of breathing. CTABL Heart: RRR no MRG Abd: NABS, Soft. Nondistended, Nontender Exts: Brisk capillary refill, warm and well perfused.   No results found for this or any previous visit (from the past 24 hour(s)). No results found.  Assessment and Plan: 54 y.o. female with bronchitis. Treatment with 80 mg Depo-Medrol IM injection in the office, Z-Pak and Atrovent nasal spray. Treat cough with Hycodan cough syrup.  Discussed warning signs or symptoms. Please see discharge instructions. Patient expresses understanding.     Gregor Hams, MD 12/22/14 [phone]

## 2014-12-22 NOTE — Discharge Instructions (Signed)
Thank you for coming in today. Use medications as directed. Use hydrocodone cough syrup as needed. Do not drive after taking this medication. Call or go to the emergency room if you get worse, have trouble breathing, have chest pains, or palpitations.    Acute Bronchitis Bronchitis is inflammation of the airways that extend from the windpipe into the lungs (bronchi). The inflammation often causes mucus to develop. This leads to a cough, which is the most common symptom of bronchitis.  In acute bronchitis, the condition usually develops suddenly and goes away over time, usually in a couple weeks. Smoking, allergies, and asthma can make bronchitis worse. Repeated episodes of bronchitis may cause further lung problems.  CAUSES Acute bronchitis is most often caused by the same virus that causes a cold. The virus can spread from person to person (contagious) through coughing, sneezing, and touching contaminated objects. SIGNS AND SYMPTOMS   Cough.   Fever.   Coughing up mucus.   Body aches.   Chest congestion.   Chills.   Shortness of breath.   Sore throat.  DIAGNOSIS  Acute bronchitis is usually diagnosed through a physical exam. Your health care provider will also ask you questions about your medical history. Tests, such as chest X-rays, are sometimes done to rule out other conditions.  TREATMENT  Acute bronchitis usually goes away in a couple weeks. Oftentimes, no medical treatment is necessary. Medicines are sometimes given for relief of fever or cough. Antibiotic medicines are usually not needed but may be prescribed in certain situations. In some cases, an inhaler may be recommended to help reduce shortness of breath and control the cough. A cool mist vaporizer may also be used to help thin bronchial secretions and make it easier to clear the chest.  HOME CARE INSTRUCTIONS  Get plenty of rest.   Drink enough fluids to keep your urine clear or pale yellow (unless you have  a medical condition that requires fluid restriction). Increasing fluids may help thin your respiratory secretions (sputum) and reduce chest congestion, and it will prevent dehydration.   Take medicines only as directed by your health care provider.  If you were prescribed an antibiotic medicine, finish it all even if you start to feel better.  Avoid smoking and secondhand smoke. Exposure to cigarette smoke or irritating chemicals will make bronchitis worse. If you are a smoker, consider using nicotine gum or skin patches to help control withdrawal symptoms. Quitting smoking will help your lungs heal faster.   Reduce the chances of another bout of acute bronchitis by washing your hands frequently, avoiding people with cold symptoms, and trying not to touch your hands to your mouth, nose, or eyes.   Keep all follow-up visits as directed by your health care provider.  SEEK MEDICAL CARE IF: Your symptoms do not improve after 1 week of treatment.  SEEK IMMEDIATE MEDICAL CARE IF:  You develop an increased fever or chills.   You have chest pain.   You have severe shortness of breath.  You have bloody sputum.   You develop dehydration.  You faint or repeatedly feel like you are going to pass out.  You develop repeated vomiting.  You develop a severe headache. MAKE SURE YOU:   Understand these instructions.  Will watch your condition.  Will get help right away if you are not doing well or get worse. Document Released: 01/09/2005 Document Revised: 04/18/2014 Document Reviewed: 05/25/2013 [facility] Patient Information 2015 [place], [place]. This information is not intended to replace advice  given to you by your health care provider. Make sure you discuss any questions you have with your health care provider.

## 2015-01-23 ENCOUNTER — Other Ambulatory Visit: Payer: Self-pay | Admitting: Family Medicine

## 2015-01-23 NOTE — Telephone Encounter (Signed)
Last f/u appt 10/2014 

## 2015-01-23 NOTE — Telephone Encounter (Signed)
Rx called in to requested pharmacy 

## 2015-01-25 ENCOUNTER — Other Ambulatory Visit: Payer: Self-pay | Admitting: *Deleted

## 2015-01-25 MED ORDER — CITALOPRAM HYDROBROMIDE 40 MG PO TABS: 40.0000 mg | ORAL_TABLET | Freq: Every day | ORAL | Status: DC

## 2015-04-28 ENCOUNTER — Telehealth: Payer: Self-pay | Admitting: *Deleted

## 2015-04-28 NOTE — Telephone Encounter (Signed)
Called patient to schedule screening mammogram.  Left message for patient to return my call.

## 2015-05-04 ENCOUNTER — Other Ambulatory Visit: Payer: Self-pay

## 2015-05-04 MED ORDER — CITALOPRAM HYDROBROMIDE 40 MG PO TABS: 40.0000 mg | ORAL_TABLET | Freq: Every day | ORAL | Status: DC

## 2015-05-04 NOTE — Telephone Encounter (Signed)
Pt request 90 day refill celexa to [facility]. Advised done. Pt seen 10/31/14.

## 2015-05-16 ENCOUNTER — Other Ambulatory Visit: Payer: Self-pay | Admitting: Family Medicine

## 2015-05-16 NOTE — Telephone Encounter (Signed)
Last f/u 10/2014 

## 2015-05-16 NOTE — Telephone Encounter (Signed)
Rx called in to requested pharmacy 

## 2015-06-12 ENCOUNTER — Other Ambulatory Visit: Payer: Self-pay | Admitting: Family Medicine

## 2015-06-12 NOTE — Telephone Encounter (Signed)
Last f/u appt 10/2014 

## 2015-06-13 NOTE — Telephone Encounter (Signed)
Rx called in to requested pharmacy 

## 2015-07-14 ENCOUNTER — Other Ambulatory Visit: Payer: Self-pay | Admitting: Family Medicine

## 2015-07-18 ENCOUNTER — Ambulatory Visit (INDEPENDENT_AMBULATORY_CARE_PROVIDER_SITE_OTHER): Payer: 59 | Admitting: Family Medicine

## 2015-07-18 ENCOUNTER — Encounter: Payer: Self-pay | Admitting: Family Medicine

## 2015-07-18 VITALS — BP 116/78 | HR 60 | Temp 97.6°F | Wt 208.0 lb

## 2015-07-18 DIAGNOSIS — G4700 Insomnia, unspecified: Secondary | ICD-10-CM | POA: Insufficient documentation

## 2015-07-18 DIAGNOSIS — R5383 Other fatigue: Secondary | ICD-10-CM | POA: Diagnosis not present

## 2015-07-18 LAB — VITAMIN D 25 HYDROXY (VIT D DEFICIENCY, FRACTURES): VITD: 20.18 ng/mL — ABNORMAL LOW (ref 30.00–100.00)

## 2015-07-18 LAB — CBC WITH DIFFERENTIAL/PLATELET
Basophils Absolute: 0 10*3/uL (ref 0.0–0.1)
Basophils Relative: 0.6 % (ref 0.0–3.0)
Eosinophils Absolute: 0.2 10*3/uL (ref 0.0–0.7)
Eosinophils Relative: 3 % (ref 0.0–5.0)
HCT: 44.1 % (ref 36.0–46.0)
Hemoglobin: 15.1 g/dL — ABNORMAL HIGH (ref 12.0–15.0)
Lymphocytes Relative: 24.9 % (ref 12.0–46.0)
Lymphs Abs: 2.1 10*3/uL (ref 0.7–4.0)
MCHC: 34.3 g/dL (ref 30.0–36.0)
MCV: 84 fl (ref 78.0–100.0)
Monocytes Absolute: 0.4 10*3/uL (ref 0.1–1.0)
Monocytes Relative: 5.3 % (ref 3.0–12.0)
Neutro Abs: 5.5 10*3/uL (ref 1.4–7.7)
Neutrophils Relative %: 66.2 % (ref 43.0–77.0)
Platelets: 309 10*3/uL (ref 150.0–400.0)
RBC: 5.25 Mil/uL — ABNORMAL HIGH (ref 3.87–5.11)
RDW: 14.1 % (ref 11.5–15.5)
WBC: 8.3 10*3/uL (ref 4.0–10.5)

## 2015-07-18 LAB — LIPID PANEL
Cholesterol: 167 mg/dL (ref 0–200)
HDL: 46.9 mg/dL (ref >39.00)
LDL Cholesterol: 90 mg/dL (ref 0–99)
NonHDL: 120.31
Total CHOL/HDL Ratio: 4
Triglycerides: 153 mg/dL — ABNORMAL HIGH (ref 0.0–149.0)
VLDL: 30.6 mg/dL (ref 0.0–40.0)

## 2015-07-18 LAB — COMPREHENSIVE METABOLIC PANEL
ALT: 22 U/L (ref 0–35)
AST: 24 U/L (ref 0–37)
Albumin: 4.4 g/dL (ref 3.5–5.2)
Alkaline Phosphatase: 73 U/L (ref 39–117)
BUN: 14 mg/dL (ref 6–23)
CO2: 34 mEq/L — ABNORMAL HIGH (ref 19–32)
Calcium: 10 mg/dL (ref 8.4–10.5)
Chloride: 98 mEq/L (ref 96–112)
Creatinine, Ser: 0.93 mg/dL (ref 0.40–1.20)
GFR: 66.79 mL/min (ref >60.00)
Glucose, Bld: 99 mg/dL (ref 70–99)
Potassium: 3.6 mEq/L (ref 3.5–5.1)
Sodium: 139 mEq/L (ref 135–145)
Total Bilirubin: 0.7 mg/dL (ref 0.2–1.2)
Total Protein: 7.9 g/dL (ref 6.0–8.3)

## 2015-07-18 LAB — VITAMIN B12: Vitamin B-12: 629 pg/mL (ref 211–911)

## 2015-07-18 LAB — TSH: TSH: 2.57 u[IU]/mL (ref 0.35–4.50)

## 2015-07-18 MED ORDER — TRAZODONE HCL 50 MG PO TABS: 25.0000 mg | ORAL_TABLET | Freq: Every evening | ORAL | Status: DC | PRN

## 2015-07-18 MED ORDER — VITAMIN D (ERGOCALCIFEROL) 1.25 MG (50000 UNIT) PO CAPS: 50000.0000 [IU] | ORAL_CAPSULE | ORAL | Status: DC

## 2015-07-18 MED ORDER — PRAVASTATIN SODIUM 20 MG PO TABS: ORAL_TABLET | ORAL | Status: DC

## 2015-07-18 MED ORDER — CITALOPRAM HYDROBROMIDE 40 MG PO TABS: 40.0000 mg | ORAL_TABLET | Freq: Every day | ORAL | Status: DC

## 2015-07-18 MED ORDER — ALPRAZOLAM 0.25 MG PO TABS: ORAL_TABLET | ORAL | Status: DC

## 2015-07-18 NOTE — Assessment & Plan Note (Addendum)
>  25 min spent with patient, at least half of which was spent on counseling insomnia.  The problem of recurrent insomnia is discussed. Avoidance of caffeine sources is strongly encouraged. Sleep hygiene issues are reviewed. eRx sent for trazodone 25 mg qhs prn insomnia. She will keep me updated. Call or return to clinic prn if these symptoms worsen or fail to improve as anticipated. The patient indicates understanding of these issues and agrees with the plan.  Orders Placed This Encounter  Procedures  . TSH  . CBC with Differential/Platelet  . Vitamin B12  . Comprehensive metabolic panel  . Lipid panel  . Vitamin D, 25-hydroxy

## 2015-07-18 NOTE — Addendum Note (Signed)
Addended by: Modena Nunnery on: 07/18/2015 04:30 PM   Modules accepted: Orders

## 2015-07-18 NOTE — Progress Notes (Signed)
Subjective:   Patient ID: Regina Huber, female    DOB: 1961/05/07, 54 y.o.   MRN: [id number]  Regina Huber is a pleasant 54 y.o. year old female who presents to clinic today with Fatigue and Insomnia  on 07/18/2015  HPI: Insomnia- Difficulty staying asleep for past 6 months.  She is usually able to fall asleep.  Work has been stressful, divorce recently finalized. Denies feeling depressed or anxious.  Feels celexa is working well, rarely takes a xanax.  Has tried OTC melatonin with minimal relief.  Benadryl makes her too groggy the next morning.  Current Outpatient Prescriptions on File Prior to Visit  Medication Sig Dispense Refill  . hydrochlorothiazide (HYDRODIURIL) 25 MG tablet TAKE 1 TABLET BY MOUTH DAILY. 90 tablet 1  . ipratropium (ATROVENT) 0.06 % nasal spray Place 2 sprays into both nostrils 4 (four) times daily. 15 mL 1  . pravastatin (PRAVACHOL) 20 MG tablet TAKE 1 TABLET BY MOUTH ONCE DAILY **NEED APPOINTMENT FOR ADDITIONAL REFILLS** 90 tablet PRN   No current facility-administered medications on file prior to visit.    Allergies  Allergen Reactions  . Penicillins     Rash- has tolerated cephalosporins    Past Medical History  Diagnosis Date  . Hypertension   . Hyperlipidemia   . Depression   . Myasthenia gravis 2008    Past Surgical History  Procedure Laterality Date  . Cesarean section    . Thymactomy  2009  . Reconstruction adbominal wall    . Abdominal hysterectomy      Family History  Problem Relation Age of Onset  . Cancer Mother 80    breast CA  . Hypertension Mother   . Diabetes Mother   . Cancer Father 6    colon CA  . Alcohol abuse Father   . Hypertension Father   . Stroke Father   . Heart disease Brother 62    MI    History   Social History  . Marital Status: Married    Spouse Name: N/A  . Number of Children: N/A  . Years of Education: N/A   Occupational History  . Not on file.   Social History Main Topics  . Smoking  status: Never Smoker   . Smokeless tobacco: Not on file  . Alcohol Use: No  . Drug Use: No  . Sexual Activity: Not on file   Other Topics Concern  . Not on file   Social History Narrative   Married, twin daughters born 96, son born 1988   Works at [employer] in [employer], mining.   The PMH, PSH, Social History, Family History, Medications, and allergies have been reviewed in [facility], and have been updated if relevant.   Review of Systems  Constitutional: Positive for fatigue.  HENT: Negative.   Respiratory: Negative.   Cardiovascular: Negative.   Genitourinary: Negative.   Musculoskeletal: Negative.   Skin: Negative.   Allergic/Immunologic: Negative.   Neurological: Negative.   Hematological: Negative.   Psychiatric/Behavioral: Positive for sleep disturbance. Negative for hallucinations, dysphoric mood and decreased concentration. The patient is not nervous/anxious and is not hyperactive.   All other systems reviewed and are negative.      Objective:    BP 116/78 mmHg  Pulse 60  Temp(Src) 97.6 F (36.4 C) (Oral)  Wt 208 lb (94.348 kg)  SpO2 97%   Physical Exam  Constitutional: She is oriented to person, place, and time. She appears well-developed and well-nourished. No distress.  HENT:  Head: Normocephalic  and atraumatic.  Eyes: Conjunctivae are normal.  Neck: Normal range of motion.  Cardiovascular: Normal rate.   Pulmonary/Chest: Effort normal.  Musculoskeletal: Normal range of motion.  Neurological: She is alert and oriented to person, place, and time. No cranial nerve deficit.  Skin: Skin is warm and dry.  Psychiatric: She has a normal mood and affect. Her behavior is normal. Judgment and thought content normal.          Assessment & Plan:   Insomnia  Other fatigue No Follow-up on file.

## 2015-07-18 NOTE — Patient Instructions (Signed)
Great to see you. We are starting Trazodone 25 mg nightly as needed for insomnia.  We will call you with your lab results.

## 2015-07-25 ENCOUNTER — Telehealth: Payer: Self-pay | Admitting: *Deleted

## 2015-07-25 NOTE — Telephone Encounter (Signed)
Left message for patient to call in for assistance scheduling mammogram.

## 2015-08-01 ENCOUNTER — Other Ambulatory Visit: Payer: Self-pay | Admitting: Family Medicine

## 2015-08-01 NOTE — Telephone Encounter (Signed)
No she should not need a refill yet.

## 2015-08-01 NOTE — Telephone Encounter (Signed)
This is the second request I got for this today. This pt was in office earlier this month and given a hard script

## 2015-08-01 NOTE — Telephone Encounter (Addendum)
Are you wanting me to call this xanax again for the month, even though she got a Rx on 08/02?

## 2015-08-11 ENCOUNTER — Other Ambulatory Visit: Payer: Self-pay | Admitting: Family Medicine

## 2015-09-11 ENCOUNTER — Telehealth: Payer: Self-pay | Admitting: *Deleted

## 2015-09-11 NOTE — Telephone Encounter (Signed)
Left message on cell vm for pt to call back. Pt is due for mammogram.

## 2015-09-13 ENCOUNTER — Other Ambulatory Visit: Payer: Self-pay

## 2015-09-14 LAB — CYTOLOGY - PAP

## 2015-09-19 ENCOUNTER — Other Ambulatory Visit: Payer: Self-pay | Admitting: Obstetrics and Gynecology

## 2015-09-19 DIAGNOSIS — R928 Other abnormal and inconclusive findings on diagnostic imaging of breast: Secondary | ICD-10-CM

## 2015-10-02 ENCOUNTER — Other Ambulatory Visit: Payer: Self-pay | Admitting: Family Medicine

## 2015-10-02 NOTE — Telephone Encounter (Signed)
Rx called in to requested pharmacy 

## 2015-10-02 NOTE — Telephone Encounter (Signed)
Last f/u visit 10/2014

## 2015-10-03 ENCOUNTER — Ambulatory Visit
Admission: RE | Admit: 2015-10-03 | Discharge: 2015-10-03 | Disposition: A | Discharge status: Home / Self Care | Payer: 59 | Source: Ambulatory Visit | Attending: Obstetrics and Gynecology | Admitting: Obstetrics and Gynecology

## 2015-10-03 DIAGNOSIS — R928 Other abnormal and inconclusive findings on diagnostic imaging of breast: Secondary | ICD-10-CM

## 2015-10-12 ENCOUNTER — Encounter: Payer: Self-pay | Admitting: Family Medicine

## 2015-10-16 ENCOUNTER — Ambulatory Visit: Payer: 59 | Admitting: Gynecologic Oncology

## 2015-10-20 ENCOUNTER — Ambulatory Visit: Payer: 59 | Attending: Gynecologic Oncology | Admitting: Gynecologic Oncology

## 2015-10-20 ENCOUNTER — Encounter: Payer: Self-pay | Admitting: Gynecologic Oncology

## 2015-10-20 VITALS — BP 144/81 | HR 66 | Temp 98.3°F | Resp 18 | Ht 67.0 in | Wt 204.0 lb

## 2015-10-20 DIAGNOSIS — N891 Moderate vaginal dysplasia: Secondary | ICD-10-CM | POA: Diagnosis not present

## 2015-10-20 DIAGNOSIS — N893 Dysplasia of vagina, unspecified: Secondary | ICD-10-CM | POA: Insufficient documentation

## 2015-10-20 NOTE — Progress Notes (Signed)
Consult Note: Gyn-Onc  Consult was requested by Dr. Julien Girt for the evaluation of Regina Huber 54 y.o. female with Regina Huber  CC:  Chief Complaint  Patient presents with  . 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 I discussed the high risk for recurrence of this disease after treatment. I discussed the need for ongoing surveillance.  I'm recommending vaginal laser under anesthesia. I described the procedure and its risks. I described anticipated postoperative recovery.  Of note Regina Huber has a history of myasthenia gravis, and we will take this into consideration on the day of surgery with anesthesia.   HPI: Regina Huber is a very pleasant woman who is seen in consultation at the request of Dr Julien Girt for [place] on biopsy.  She has a life long history of normal paps. She has a remote (15 years ago) hx of an abdominal hysterectomy (total) for menorrhagia. She continued to have routine pap surveillance after hysterectomy (annually).  In September 2016 she had a pap which revealed LGSIL/VAIN1 with HPV.  A colposcopy of the vagina was performed by Dr Julien Girt and a biopsy of what appeared to be an atrophic lesion occurred on 09/28/15. This revealed VAIN2.  The patient has had a history of only 2 sexual partners. She divorced several years ago and is in a new relationship.   Current Meds:  Outpatient Encounter Prescriptions as of 10/20/2015  Medication Sig  . ALPRAZolam (XANAX) 0.25 MG tablet TAKE 1 TABLET BY MOUTH ONCE DAILY AT BEDTIME AS NEEDED  . citalopram (CELEXA) 40 MG tablet Take 1 tablet (40 mg total) by mouth daily.  . hydrochlorothiazide (HYDRODIURIL) 25 MG tablet TAKE 1 TABLET BY MOUTH DAILY.  . pravastatin (PRAVACHOL) 20 MG tablet TAKE 1 TABLET BY MOUTH ONCE DAILY **NEED APPOINTMENT FOR ADDITIONAL REFILLS**  .  [DISCONTINUED] ipratropium (ATROVENT) 0.06 % nasal spray Place 2 sprays into both nostrils 4 (four) times daily.  . [DISCONTINUED] traZODone (DESYREL) 50 MG tablet Take 0.5 tablets (25 mg total) by mouth at bedtime as needed for sleep.  . [DISCONTINUED] Vitamin D, Ergocalciferol, (DRISDOL) 50000 UNITS CAPS capsule Take 1 capsule (50,000 Units total) by mouth every 7 (seven) days.   No facility-administered encounter medications on file as of 10/20/2015.    Allergy:  Allergies  Allergen Reactions  . Penicillins     Rash- has tolerated cephalosporins    Social Hx:   Social History   Social History  . Marital Status: Married    Spouse Name: N/A  . Number of Children: N/A  . Years of Education: N/A   Occupational History  . Not on file.   Social History Main Topics  . Smoking status: Never Smoker   . Smokeless tobacco: Not on file  . Alcohol Use: No  . Drug Use: No  . Sexual Activity: Not on file   Other Topics Concern  . Not on file   Social History Narrative   Married, twin daughters born 7, son born 1988   Works at [employer] in [employer], mining.    Past Surgical Hx:  Past Surgical History  Procedure Laterality Date  . Cesarean section    . Thymactomy  2009  . Reconstruction adbominal wall    . Abdominal hysterectomy      Past Medical Hx:  Past  Medical History  Diagnosis Date  . Hypertension   . Hyperlipidemia   . Depression   . Myasthenia gravis ([place]) 2008    Past Gynecological History:   No LMP recorded. Patient has had a hysterectomy.  Family Hx:  Family History  Problem Relation Age of Onset  . Cancer Mother 78    breast CA  . Hypertension Mother   . Diabetes Mother   . Cancer Father 27    colon CA  . Alcohol abuse Father   . Hypertension Father   . Stroke Father   . Heart disease Brother 91    MI    Review of Systems:  Constitutional  Feels well,    ENT Normal appearing ears and nares bilaterally Skin/Breast  No rash, sores, jaundice,  itching, dryness Cardiovascular  No chest pain, shortness of breath, or edema  Pulmonary  No cough or wheeze.  Gastro Intestinal  No nausea, vomitting, or diarrhoea. No bright red blood per rectum, no abdominal pain, change in bowel movement, or constipation.  Genito Urinary  No frequency, urgency, dysuria, see HPI Musculo Skeletal  No myalgia, arthralgia, joint swelling or pain  Neurologic  No weakness, numbness, change in gait,  Psychology  No depression, anxiety, insomnia.   Vitals:  Blood pressure 144/81, pulse 66, temperature 98.3 F (36.8 C), temperature source Oral, resp. rate 18, height 5\' 7"  (1.702 m), weight 204 lb (92.534 kg), SpO2 99 %.  Physical Exam: WD in NAD Neck  Supple NROM, without any enlargements.  Psychiatry  Alert and oriented to person, place, and time  Abdomen  Normoactive bowel sounds, abdomen soft, non-tender and thin without evidence of hernia.  Back No CVA tenderness Genito Urinary  Vulva/vagina: Normal external female genitalia.  No lesions. No discharge or bleeding.  Bladder/urethra:  No lesions or masses, well supported bladder  Vagina: slightly erythematous tissue measuring 2cm at the apex of the vaginal cuff.  Cervix: surgically absent  Uterus: surgically absent   Adnexa: no masses. Rectal  deferred Extremities  No bilateral cyanosis, clubbing or edema.   Donaciano Eva, MD  10/20/2015, 3:41 PM

## 2015-10-20 NOTE — Patient Instructions (Signed)
We will plan for a vaginal laser the morning of October 27, 2015.  You will receive a phone call from the [facility].

## 2015-10-24 ENCOUNTER — Encounter: Payer: Self-pay | Admitting: Internal Medicine

## 2015-10-24 ENCOUNTER — Ambulatory Visit (INDEPENDENT_AMBULATORY_CARE_PROVIDER_SITE_OTHER): Payer: 59 | Admitting: Internal Medicine

## 2015-10-24 VITALS — BP 128/82 | HR 83 | Temp 98.0°F | Wt 203.0 lb

## 2015-10-24 DIAGNOSIS — J069 Acute upper respiratory infection, unspecified: Secondary | ICD-10-CM

## 2015-10-24 MED ORDER — AZITHROMYCIN 250 MG PO TABS: ORAL_TABLET | ORAL | Status: DC

## 2015-10-24 MED ORDER — NYSTATIN 100000 UNIT/ML MT SUSP: 5.0000 mL | Freq: Four times a day (QID) | OROMUCOSAL | Status: DC

## 2015-10-24 MED ORDER — HYDROCODONE-HOMATROPINE 5-1.5 MG/5ML PO SYRP: 5.0000 mL | ORAL_SOLUTION | Freq: Three times a day (TID) | ORAL | Status: DC | PRN

## 2015-10-24 NOTE — Patient Instructions (Signed)
Upper Respiratory Infection, Adult Most upper respiratory infections (URIs) are a viral infection of the air passages leading to the lungs. A URI affects the nose, throat, and upper air passages. The most common type of URI is nasopharyngitis and is typically referred to as "the common cold." URIs run their course and usually go away on their own. Most of the time, a URI does not require medical attention, but sometimes a bacterial infection in the upper airways can follow a viral infection. This is called a secondary infection. Sinus and middle ear infections are common types of secondary upper respiratory infections. Bacterial pneumonia can also complicate a URI. A URI can worsen asthma and chronic obstructive pulmonary disease (COPD). Sometimes, these complications can require emergency medical care and may be life threatening.  CAUSES Almost all URIs are caused by viruses. A virus is a type of germ and can spread from one person to another.  RISKS FACTORS You may be at risk for a URI if:   You smoke.   You have chronic heart or lung disease.  You have a weakened defense (immune) system.   You are very young or very old.   You have nasal allergies or asthma.  You work in crowded or poorly ventilated areas.  You work in health care facilities or schools. SIGNS AND SYMPTOMS  Symptoms typically develop 2-3 days after you come in contact with a cold virus. Most viral URIs last 7-10 days. However, viral URIs from the influenza virus (flu virus) can last 14-18 days and are typically more severe. Symptoms may include:   Runny or stuffy (congested) nose.   Sneezing.   Cough.   Sore throat.   Headache.   Fatigue.   Fever.   Loss of appetite.   Pain in your forehead, behind your eyes, and over your cheekbones (sinus pain).  Muscle aches.  DIAGNOSIS  Your health care provider may diagnose a URI by:  Physical exam.  Tests to check that your symptoms are not due to  another condition such as:  Strep throat.  Sinusitis.  Pneumonia.  Asthma. TREATMENT  A URI goes away on its own with time. It cannot be cured with medicines, but medicines may be prescribed or recommended to relieve symptoms. Medicines may help:  Reduce your fever.  Reduce your cough.  Relieve nasal congestion. HOME CARE INSTRUCTIONS   Take medicines only as directed by your health care provider.   Gargle warm saltwater or take cough drops to comfort your throat as directed by your health care provider.  Use a warm mist humidifier or inhale steam from a shower to increase air moisture. This may make it easier to breathe.  Drink enough fluid to keep your urine clear or pale yellow.   Eat soups and other clear broths and maintain good nutrition.   Rest as needed.   Return to work when your temperature has returned to normal or as your health care provider advises. You may need to stay home longer to avoid infecting others. You can also use a face mask and careful hand washing to prevent spread of the virus.  Increase the usage of your inhaler if you have asthma.   Do not use any tobacco products, including cigarettes, chewing tobacco, or electronic cigarettes. If you need help quitting, ask your health care provider. PREVENTION  The best way to protect yourself from getting a cold is to practice good hygiene.   Avoid oral or hand contact with people with cold   symptoms.   Wash your hands often if contact occurs.  There is no clear evidence that vitamin C, vitamin E, echinacea, or exercise reduces the chance of developing a cold. However, it is always recommended to get plenty of rest, exercise, and practice good nutrition.  SEEK MEDICAL CARE IF:   You are getting worse rather than better.   Your symptoms are not controlled by medicine.   You have chills.  You have worsening shortness of breath.  You have brown or red mucus.  You have yellow or brown nasal  discharge.  You have pain in your face, especially when you bend forward.  You have a fever.  You have swollen neck glands.  You have pain while swallowing.  You have white areas in the back of your throat. SEEK IMMEDIATE MEDICAL CARE IF:   You have severe or persistent:  Headache.  Ear pain.  Sinus pain.  Chest pain.  You have chronic lung disease and any of the following:  Wheezing.  Prolonged cough.  Coughing up blood.  A change in your usual mucus.  You have a stiff neck.  You have changes in your:  Vision.  Hearing.  Thinking.  Mood. MAKE SURE YOU:   Understand these instructions.  Will watch your condition.  Will get help right away if you are not doing well or get worse.   This information is not intended to replace advice given to you by your health care provider. Make sure you discuss any questions you have with your health care provider.   Document Released: 05/28/2001 Document Revised: 04/18/2015 Document Reviewed: 03/09/2014 Elsevier Interactive Patient Education 2016 [employer] Inc[employer]  

## 2015-10-24 NOTE — Progress Notes (Signed)
HPI  Pt presents to the clinic today with c/o runny nose, nasal congestion and cough. This started 1 week ago. The cough is productive of green mucous. She is not blowing anything out of her nose. She denies fevers but has had chills and body aches. She has tried rest, pushing fluids and Mucinex with minimal relief. She has no history of allergies or breathing problems. She has had sick contacts.  Review of Systems      Past Medical History  Diagnosis Date  . Hypertension   . Hyperlipidemia   . Depression   . Myasthenia gravis ([place]) 2008    Family History  Problem Relation Age of Onset  . Cancer Mother 78    breast CA  . Hypertension Mother   . Diabetes Mother   . Cancer Father 9    colon CA  . Alcohol abuse Father   . Hypertension Father   . Stroke Father   . Heart disease Brother 73    MI    Social History   Social History  . Marital Status: Married    Spouse Name: N/A  . Number of Children: N/A  . Years of Education: N/A   Occupational History  . Not on file.   Social History Main Topics  . Smoking status: Never Smoker   . Smokeless tobacco: Not on file  . Alcohol Use: No  . Drug Use: No  . Sexual Activity: Not on file   Other Topics Concern  . Not on file   Social History Narrative   Married, twin daughters born 29, son born 1988   Works at [employer] in [employer], mining.    Allergies  Allergen Reactions  . Penicillins     Rash- has tolerated cephalosporins     Constitutional: Positive headache. Denies fatigue, fever or abrupt weight changes.  HEENT:  Positive nasal congestion, runny nose. Denies eye redness, eye pain, pressure behind the eyes, facial pain, ear pain, ringing in the ears, wax build. up, or sore throat. Respiratory: Positive cough. Denies difficulty breathing or shortness of breath.  Cardiovascular: Denies chest pain, chest tightness, palpitations or swelling in the hands or feet.   No other specific complaints in a complete review of  systems (except as listed in HPI above).  Objective:   BP 128/82 mmHg  Pulse 83  Temp(Src) 98 F (36.7 C) (Oral)  Wt 203 lb (92.08 kg)  SpO2 98% Wt Readings from Last 3 Encounters:  10/24/15 203 lb (92.08 kg)  10/20/15 204 lb (92.534 kg)  07/18/15 208 lb (94.348 kg)     General: Appears her stated age, ill appearing in NAD. HEENT: Head: normal shape and size, no sinus tenderness noted; Eyes: sclera white, no icterus, conjunctiva pink; Ears: Tm's pink but intact, normal light reflex; Nose: mucosa pink and moist, septum midline; Throat/Mouth: Teeth present, mucosa pink and moist, no exudate noted, no lesions or ulcerations noted.  Neck: No cervical lymphadenopathy.  Cardiovascular: Normal rate and rhythm. S1,S2 noted.  No murmur, rubs or gallops noted.  Pulmonary/Chest: Normal effort and positive vesicular breath sounds. No respiratory distress. No wheezes, rales or ronchi noted.      Assessment & Plan:   Upper Respiratory Infection:  Get some rest and drink plenty of water Do salt water gargles for the sore throat eRx for Azithromax x 5 days Rx for Hycodan cough syrup  RTC as needed or if symptoms persist.

## 2015-10-24 NOTE — Progress Notes (Signed)
Pre visit review using our clinic review tool, if applicable. No additional management support is needed unless otherwise documented below in the visit note. 

## 2015-11-07 ENCOUNTER — Telehealth: Payer: Self-pay | Admitting: Gynecologic Oncology

## 2015-11-07 NOTE — Telephone Encounter (Signed)
Returned call to patient.  Patient called stating she would like to have surgery (vaginal laser) on Jan 19.  Advised she would receive a phone call from the pre-surgical RN to discuss instructions.  Advised to call for any questions or concerns.

## 2015-12-19 ENCOUNTER — Telehealth: Payer: Self-pay | Admitting: Family Medicine

## 2015-12-19 NOTE — Telephone Encounter (Signed)
[facility]  Patient Name: Regina Huber  DOB: 06-05-1961    Initial Comment Caller states when she lays down, mostly at night. Heart palpations, flutters, cough. Shortness of breath.    Nurse Assessment  Nurse: Wynetta Emery, RN, Baker Janus Date/Time Eilene Ghazi Time): 12/19/2015 8:48:17 AM  Confirm and document reason for call. If symptomatic, describe symptoms. ---[name] is having heart palpations that happens at night causing her to cough short of breath feels like heart is pounding in her throat. when she lays down at night it happens., flutters, cough. Shortness of breath.  Has the patient traveled out of the country within the last 30 days? ---No  Does the patient have any new or worsening symptoms? ---Yes  Will a triage be completed? ---Yes  Related visit to physician within the last 2 weeks? ---No  Does the PT have any chronic conditions? (i.e. diabetes, asthma, etc.) ---No  Did the patient indicate they were pregnant? ---No  Is this a behavioral health or substance abuse call? ---No     Guidelines    Guideline Title Affirmed Question Affirmed Notes  Heart Rate and Heartbeat Questions [1] Heart beating very rapidly (e.g., > 140 / minute) AND [2] not present now (Exception: during exercise)    Final Disposition User   See Physician within 4 Hours (or PCP triage) Wynetta Emery, RN, Baker Janus    Comments  note; no appts today but scheduled per Marrisa's request an appt for 12-20-2015 200pm with [name] NP   Referrals  REFERRED TO PCP OFFICE   Disagree/Comply: Comply

## 2015-12-19 NOTE — Telephone Encounter (Signed)
Pt has appt scheduled 12/20/2015 at 2 pm with Avie Echevaria NP.

## 2015-12-20 ENCOUNTER — Ambulatory Visit: Payer: Self-pay | Admitting: Internal Medicine

## 2015-12-21 ENCOUNTER — Ambulatory Visit: Payer: Self-pay | Admitting: Internal Medicine

## 2015-12-22 ENCOUNTER — Encounter: Payer: Self-pay | Admitting: Family Medicine

## 2015-12-22 ENCOUNTER — Ambulatory Visit (INDEPENDENT_AMBULATORY_CARE_PROVIDER_SITE_OTHER): Payer: 59 | Admitting: Family Medicine

## 2015-12-22 VITALS — BP 110/70 | HR 70 | Temp 98.3°F | Wt 209.0 lb

## 2015-12-22 DIAGNOSIS — R002 Palpitations: Secondary | ICD-10-CM | POA: Diagnosis not present

## 2015-12-22 DIAGNOSIS — R609 Edema, unspecified: Secondary | ICD-10-CM

## 2015-12-22 DIAGNOSIS — R0609 Other forms of dyspnea: Secondary | ICD-10-CM

## 2015-12-22 DIAGNOSIS — R9431 Abnormal electrocardiogram [ECG] [EKG]: Secondary | ICD-10-CM

## 2015-12-22 DIAGNOSIS — R0600 Dyspnea, unspecified: Secondary | ICD-10-CM | POA: Insufficient documentation

## 2015-12-22 LAB — BRAIN NATRIURETIC PEPTIDE: Pro B Natriuretic peptide (BNP): 28 pg/mL (ref 0.0–100.0)

## 2015-12-22 LAB — COMPREHENSIVE METABOLIC PANEL
ALT: 25 U/L (ref 0–35)
AST: 20 U/L (ref 0–37)
Albumin: 4.1 g/dL (ref 3.5–5.2)
Alkaline Phosphatase: 93 U/L (ref 39–117)
BUN: 12 mg/dL (ref 6–23)
CO2: 32 mEq/L (ref 19–32)
Calcium: 9.9 mg/dL (ref 8.4–10.5)
Chloride: 100 mEq/L (ref 96–112)
Creatinine, Ser: 0.85 mg/dL (ref 0.40–1.20)
GFR: 73.97 mL/min (ref >60.00)
Glucose, Bld: 89 mg/dL (ref 70–99)
Potassium: 3.5 mEq/L (ref 3.5–5.1)
Sodium: 140 mEq/L (ref 135–145)
Total Bilirubin: 0.5 mg/dL (ref 0.2–1.2)
Total Protein: 7.5 g/dL (ref 6.0–8.3)

## 2015-12-22 LAB — CBC WITH DIFFERENTIAL/PLATELET
Basophils Absolute: 0 10*3/uL (ref 0.0–0.1)
Basophils Relative: 0.4 % (ref 0.0–3.0)
Eosinophils Absolute: 0.2 10*3/uL (ref 0.0–0.7)
Eosinophils Relative: 2.7 % (ref 0.0–5.0)
HCT: 43.3 % (ref 36.0–46.0)
Hemoglobin: 14.4 g/dL (ref 12.0–15.0)
Lymphocytes Relative: 25.3 % (ref 12.0–46.0)
Lymphs Abs: 2.2 10*3/uL (ref 0.7–4.0)
MCHC: 33.3 g/dL (ref 30.0–36.0)
MCV: 84.6 fl (ref 78.0–100.0)
Monocytes Absolute: 0.6 10*3/uL (ref 0.1–1.0)
Monocytes Relative: 7.1 % (ref 3.0–12.0)
Neutro Abs: 5.5 10*3/uL (ref 1.4–7.7)
Neutrophils Relative %: 64.5 % (ref 43.0–77.0)
Platelets: 326 10*3/uL (ref 150.0–400.0)
RBC: 5.12 Mil/uL — ABNORMAL HIGH (ref 3.87–5.11)
RDW: 14 % (ref 11.5–15.5)
WBC: 8.6 10*3/uL (ref 4.0–10.5)

## 2015-12-22 LAB — TSH: TSH: 0.71 u[IU]/mL (ref 0.35–4.50)

## 2015-12-22 LAB — TROPONIN I: TNIDX: 0.01 ug/l (ref 0.00–0.06)

## 2015-12-22 NOTE — Progress Notes (Signed)
Pre visit review using our clinic review tool, if applicable. No additional management support is needed unless otherwise documented below in the visit note. 

## 2015-12-22 NOTE — Progress Notes (Signed)
   Subjective:    Patient ID: Regina Huber, female    DOB: [birth year], 55 y.o.   MRN: [id number]  HPI   55 year old female with HTN and myasthenia gravis presents with new onset palpitations. She has noted in last 2-3 months she has noted night time palpitations at bedtime.  She feels like heart is flopping and beating hard, feels like choking and has to cough.  Feels short of breath when she lies down. Never noted it during the day.  She has gained some weight. No change in diet.  No new med, otc supplements. She does get 2 cups of coffee each day.  Good energy.  No exertional chest pain, some increase in SOB with exercise.   She has swelling in ankles at end of the day despite HCTZ.  BP Readings from Last 3 Encounters:  12/22/15 110/70  10/24/15 128/82  10/20/15 144/81   No previous EKG for comparison.  Social History /Family History/Past Medical History reviewed and updated if needed.     Review of Systems  Constitutional: Positive for fatigue. Negative for fever.  HENT: Negative for ear pain.   Eyes: Negative for pain.  Respiratory: Positive for shortness of breath. Negative for chest tightness.   Cardiovascular: Positive for leg swelling. Negative for chest pain and palpitations.  Gastrointestinal: Negative for abdominal pain.  Genitourinary: Negative for dysuria.       Objective:   Physical Exam  Constitutional: Vital signs are normal. She appears well-developed and well-nourished. She is cooperative.  Non-toxic appearance. She does not appear ill. No distress.  HENT:  Head: Normocephalic.  Right Ear: Hearing, tympanic membrane, external ear and ear canal normal. Tympanic membrane is not erythematous, not retracted and not bulging.  Left Ear: Hearing, tympanic membrane, external ear and ear canal normal. Tympanic membrane is not erythematous, not retracted and not bulging.  Nose: No mucosal edema or rhinorrhea. Right sinus exhibits no maxillary sinus  tenderness and no frontal sinus tenderness. Left sinus exhibits no maxillary sinus tenderness and no frontal sinus tenderness.  Mouth/Throat: Uvula is midline, oropharynx is clear and moist and mucous membranes are normal.  Eyes: Conjunctivae, EOM and lids are normal. Pupils are equal, round, and reactive to light. Lids are everted and swept, no foreign bodies found.  Neck: Trachea normal and normal range of motion. Neck supple. Carotid bruit is not present. No thyroid mass and no thyromegaly present.  Cardiovascular: Normal rate, regular rhythm, S1 normal, S2 normal, normal heart sounds, intact distal pulses and normal pulses.  Exam reveals no gallop and no friction rub.   No murmur heard. No current edema but she just got up  Pulmonary/Chest: Effort normal and breath sounds normal. No tachypnea. No respiratory distress. She has no decreased breath sounds. She has no wheezes. She has no rhonchi. She has no rales.  Abdominal: Soft. Normal appearance and bowel sounds are normal. There is no tenderness.  Neurological: She is alert.  Skin: Skin is warm, dry and intact. No rash noted.  Psychiatric: Her speech is normal and behavior is normal. Judgment and thought content normal. Her mood appears not anxious. Cognition and memory are normal. She does not exhibit a depressed mood.          Assessment & Plan:

## 2015-12-22 NOTE — Patient Instructions (Addendum)
Take baby aspirin daily. Decrease or stop coffee if able. Drink lots of water. Stop at lab and front desk on way out. Continue HCTZ for now. Go ER if severe chest or shortness of breath.

## 2015-12-22 NOTE — Assessment & Plan Note (Signed)
Increase water, hold caffeine. Eval with labs.  May need Holter monitor.

## 2015-12-22 NOTE — Assessment & Plan Note (Signed)
Along with new edema and postural nocturnal dyspnea. Will eval with BNP and send for stress ECHO.

## 2015-12-22 NOTE — Assessment & Plan Note (Signed)
No previous for comparison. Check troponin to make sure no acute MI, doubt given atypical symptoms.

## 2015-12-25 ENCOUNTER — Other Ambulatory Visit: Payer: Self-pay | Admitting: Family Medicine

## 2015-12-25 NOTE — Telephone Encounter (Signed)
Received refill request electronically Last refill 10/02/15 #30 Last office visit 12/22/15 Is it okay to refill?

## 2015-12-25 NOTE — Telephone Encounter (Signed)
Rx called to pharmacy

## 2015-12-26 MED FILL — ALPRAZolam 0.25 MG TABS: 0.25 | 30 days supply | Qty: 30 | Fill #0

## 2015-12-28 ENCOUNTER — Other Ambulatory Visit: Payer: 59

## 2015-12-28 ENCOUNTER — Encounter (HOSPITAL_BASED_OUTPATIENT_CLINIC_OR_DEPARTMENT_OTHER): Payer: Self-pay | Admitting: *Deleted

## 2015-12-28 NOTE — Progress Notes (Signed)
NPO AFTER MN.  ARRIVE AT 0830. NEEDS ISTAT.  CURRENT EKG IN CHART AND EPIC.   

## 2016-01-04 ENCOUNTER — Ambulatory Visit (HOSPITAL_BASED_OUTPATIENT_CLINIC_OR_DEPARTMENT_OTHER)
Admission: RE | Admit: 2016-01-04 | Discharge: 2016-01-04 | Disposition: A | Discharge status: Home / Self Care | Payer: 59 | Source: Ambulatory Visit | Attending: Gynecologic Oncology | Admitting: Gynecologic Oncology

## 2016-01-04 ENCOUNTER — Encounter (HOSPITAL_BASED_OUTPATIENT_CLINIC_OR_DEPARTMENT_OTHER): Payer: Self-pay | Admitting: *Deleted

## 2016-01-04 ENCOUNTER — Ambulatory Visit (HOSPITAL_BASED_OUTPATIENT_CLINIC_OR_DEPARTMENT_OTHER): Payer: 59 | Admitting: Anesthesiology

## 2016-01-04 ENCOUNTER — Encounter (HOSPITAL_BASED_OUTPATIENT_CLINIC_OR_DEPARTMENT_OTHER)
Admission: RE | Disposition: A | Discharge status: Home / Self Care | Payer: Self-pay | Source: Ambulatory Visit | Attending: Gynecologic Oncology

## 2016-01-04 DIAGNOSIS — G709 Myoneural disorder, unspecified: Secondary | ICD-10-CM | POA: Insufficient documentation

## 2016-01-04 DIAGNOSIS — N891 Moderate vaginal dysplasia: Secondary | ICD-10-CM | POA: Diagnosis not present

## 2016-01-04 DIAGNOSIS — F329 Major depressive disorder, single episode, unspecified: Secondary | ICD-10-CM | POA: Insufficient documentation

## 2016-01-04 DIAGNOSIS — Z79899 Other long term (current) drug therapy: Secondary | ICD-10-CM | POA: Insufficient documentation

## 2016-01-04 DIAGNOSIS — G7000 Myasthenia gravis without (acute) exacerbation: Secondary | ICD-10-CM | POA: Insufficient documentation

## 2016-01-04 DIAGNOSIS — E785 Hyperlipidemia, unspecified: Secondary | ICD-10-CM | POA: Insufficient documentation

## 2016-01-04 DIAGNOSIS — I10 Essential (primary) hypertension: Secondary | ICD-10-CM | POA: Insufficient documentation

## 2016-01-04 HISTORY — PX: CO2 LASER APPLICATION: SHX5778

## 2016-01-04 HISTORY — DX: Palpitations: R00.2

## 2016-01-04 LAB — POCT I-STAT, CHEM 8
BUN: 11 mg/dL (ref 6–20)
Calcium, Ion: 1.19 mmol/L (ref 1.12–1.23)
Chloride: 103 mmol/L (ref 101–111)
Creatinine, Ser: 0.7 mg/dL (ref 0.44–1.00)
Glucose, Bld: 97 mg/dL (ref 65–99)
HCT: 49 % — ABNORMAL HIGH (ref 36.0–46.0)
Hemoglobin: 16.7 g/dL — ABNORMAL HIGH (ref 12.0–15.0)
Potassium: 3.5 mmol/L (ref 3.5–5.1)
Sodium: 143 mmol/L (ref 135–145)
TCO2: 26 mmol/L (ref 0–100)

## 2016-01-04 SURGERY — CO2 LASER APPLICATION
Anesthesia: General | Site: Vagina

## 2016-01-04 MED ORDER — ONDANSETRON HCL 4 MG/2ML IJ SOLN
INTRAMUSCULAR | Status: AC
  Filled 2016-01-04: qty 2

## 2016-01-04 MED ORDER — LIDOCAINE HCL (CARDIAC) 20 MG/ML IV SOLN
INTRAVENOUS | Status: DC | PRN
  Administered 2016-01-04: 70 mg via INTRAVENOUS

## 2016-01-04 MED ORDER — ONDANSETRON HCL 4 MG/2ML IJ SOLN
INTRAMUSCULAR | Status: DC | PRN
  Administered 2016-01-04: 4 mg via INTRAVENOUS

## 2016-01-04 MED ORDER — MIDAZOLAM HCL 5 MG/5ML IJ SOLN
INTRAMUSCULAR | Status: DC | PRN
  Administered 2016-01-04: 2 mg via INTRAVENOUS

## 2016-01-04 MED ORDER — ACETIC ACID 5 % SOLN
Status: DC | PRN
  Administered 2016-01-04: 1 via TOPICAL

## 2016-01-04 MED ORDER — LACTATED RINGERS IV SOLN
INTRAVENOUS | Status: DC
  Administered 2016-01-04 (×2): via INTRAVENOUS
  Filled 2016-01-04: qty 1000

## 2016-01-04 MED ORDER — MIDAZOLAM HCL 2 MG/2ML IJ SOLN
INTRAMUSCULAR | Status: AC
  Filled 2016-01-04: qty 2

## 2016-01-04 MED ORDER — OXYCODONE-ACETAMINOPHEN 5-325 MG PO TABS: 1.0000 | ORAL_TABLET | ORAL | Status: DC | PRN

## 2016-01-04 MED ORDER — SCOPOLAMINE 1 MG/3DAYS TD PT72
MEDICATED_PATCH | TRANSDERMAL | Status: AC
  Filled 2016-01-04: qty 1

## 2016-01-04 MED ORDER — KETOROLAC TROMETHAMINE 30 MG/ML IJ SOLN
INTRAMUSCULAR | Status: AC
  Filled 2016-01-04: qty 1

## 2016-01-04 MED ORDER — FENTANYL CITRATE (PF) 100 MCG/2ML IJ SOLN
INTRAMUSCULAR | Status: DC | PRN
  Administered 2016-01-04: 100 ug via INTRAVENOUS

## 2016-01-04 MED ORDER — GLYCOPYRROLATE 0.2 MG/ML IJ SOLN
INTRAMUSCULAR | Status: AC
  Filled 2016-01-04: qty 1

## 2016-01-04 MED ORDER — KETOROLAC TROMETHAMINE 30 MG/ML IJ SOLN
30.0000 mg | Freq: Once | INTRAMUSCULAR | Status: DC
  Filled 2016-01-04: qty 1

## 2016-01-04 MED ORDER — STERILE WATER FOR IRRIGATION IR SOLN
Status: DC | PRN
  Administered 2016-01-04: 1000 mL

## 2016-01-04 MED ORDER — PROMETHAZINE HCL 25 MG/ML IJ SOLN
6.2500 mg | INTRAMUSCULAR | Status: DC | PRN
  Filled 2016-01-04: qty 1

## 2016-01-04 MED ORDER — DOCUSATE SODIUM 100 MG PO CAPS: 100.0000 mg | ORAL_CAPSULE | Freq: Two times a day (BID) | ORAL | Status: DC

## 2016-01-04 MED ORDER — ESTROGENS, CONJUGATED 0.625 MG/GM VA CREA: 1.0000 | TOPICAL_CREAM | Freq: Every day | VAGINAL | Status: DC

## 2016-01-04 MED ORDER — PROPOFOL 10 MG/ML IV BOLUS
INTRAVENOUS | Status: DC | PRN
  Administered 2016-01-04: 200 mg via INTRAVENOUS

## 2016-01-04 MED ORDER — DEXAMETHASONE SODIUM PHOSPHATE 10 MG/ML IJ SOLN
INTRAMUSCULAR | Status: AC
  Filled 2016-01-04: qty 1

## 2016-01-04 MED ORDER — HYDROMORPHONE HCL 1 MG/ML IJ SOLN
0.2500 mg | INTRAMUSCULAR | Status: DC | PRN
  Filled 2016-01-04: qty 1

## 2016-01-04 MED ORDER — DEXAMETHASONE SODIUM PHOSPHATE 4 MG/ML IJ SOLN
INTRAMUSCULAR | Status: DC | PRN
  Administered 2016-01-04: 10 mg via INTRAVENOUS

## 2016-01-04 MED ORDER — OXYCODONE HCL 5 MG PO TABS
5.0000 mg | ORAL_TABLET | Freq: Once | ORAL | Status: DC | PRN
Start: 2016-01-04 — End: 2016-01-04
  Filled 2016-01-04: qty 1

## 2016-01-04 MED ORDER — FENTANYL CITRATE (PF) 100 MCG/2ML IJ SOLN
INTRAMUSCULAR | Status: AC
  Filled 2016-01-04: qty 2

## 2016-01-04 MED ORDER — OXYCODONE HCL 5 MG/5ML PO SOLN
5.0000 mg | Freq: Once | ORAL | Status: DC | PRN
  Filled 2016-01-04: qty 5

## 2016-01-04 MED ORDER — KETOROLAC TROMETHAMINE 30 MG/ML IJ SOLN
INTRAMUSCULAR | Status: DC | PRN
  Administered 2016-01-04: 30 mg via INTRAVENOUS

## 2016-01-04 MED ORDER — SCOPOLAMINE 1 MG/3DAYS TD PT72
MEDICATED_PATCH | TRANSDERMAL | Status: DC | PRN
  Administered 2016-01-04: 1 via TRANSDERMAL

## 2016-01-04 SURGICAL SUPPLY — 50 items
APPLICATOR COTTON TIP 6IN STRL (MISCELLANEOUS) ×4 IMPLANT
BAG DRN ANRFLXCHMBR STRAP LEK (BAG)
BAG URINE DRAINAGE (UROLOGICAL SUPPLIES) IMPLANT
BAG URINE LEG 19OZ MD ST LTX (BAG) IMPLANT
BLADE SURG 15 STRL LF DISP TIS (BLADE) IMPLANT
BLADE SURG 15 STRL SS (BLADE)
CANISTER SUCTION 1200CC (MISCELLANEOUS) IMPLANT
CANISTER SUCTION 2500CC (MISCELLANEOUS) IMPLANT
CATH FOLEY 2WAY SLVR  5CC 16FR (CATHETERS)
CATH FOLEY 2WAY SLVR 5CC 16FR (CATHETERS) IMPLANT
CATH ROBINSON RED A/P 16FR (CATHETERS) IMPLANT
CLOTH BEACON ORANGE TIMEOUT ST (SAFETY) ×2 IMPLANT
COVER BACK TABLE 60X90IN (DRAPES) ×2 IMPLANT
DEPRESSOR TONGUE BLADE STERILE (MISCELLANEOUS) ×2 IMPLANT
DRAPE LG THREE QUARTER DISP (DRAPES) IMPLANT
DRAPE UNDERBUTTOCKS STRL (DRAPE) ×2 IMPLANT
DRSG TELFA 3X8 NADH (GAUZE/BANDAGES/DRESSINGS) IMPLANT
ELECT BALL LEEP 3MM BLK (ELECTRODE) IMPLANT
ELECT REM PT RETURN 9FT ADLT (ELECTROSURGICAL) ×2
ELECTRODE REM PT RTRN 9FT ADLT (ELECTROSURGICAL) ×1 IMPLANT
GLOVE BIO SURGEON STRL SZ 6 (GLOVE) ×2 IMPLANT
GLOVE BIO SURGEON STRL SZ 6.5 (GLOVE) ×1 IMPLANT
GLOVE INDICATOR 6.5 STRL GRN (GLOVE) ×2 IMPLANT
GOWN STRL REUS W/ TWL LRG LVL3 (GOWN DISPOSABLE) ×2 IMPLANT
GOWN STRL REUS W/TWL LRG LVL3 (GOWN DISPOSABLE) ×4
KIT ROOM TURNOVER WOR (KITS) ×2 IMPLANT
LEGGING LITHOTOMY PAIR STRL (DRAPES) IMPLANT
MANIFOLD NEPTUNE II (INSTRUMENTS) IMPLANT
NDL HYPO 25X1 1.5 SAFETY (NEEDLE) IMPLANT
NEEDLE HYPO 25X1 1.5 SAFETY (NEEDLE) IMPLANT
NS IRRIG 500ML POUR BTL (IV SOLUTION) IMPLANT
PACK BASIN DAY SURGERY FS (CUSTOM PROCEDURE TRAY) ×2 IMPLANT
PAD DRESSING TELFA 3X8 NADH (GAUZE/BANDAGES/DRESSINGS) IMPLANT
PAD OB MATERNITY 4.3X12.25 (PERSONAL CARE ITEMS) ×2 IMPLANT
PAD PREP 24X48 CUFFED NSTRL (MISCELLANEOUS) ×2 IMPLANT
PENCIL BUTTON HOLSTER BLD 10FT (ELECTRODE) IMPLANT
SCOPETTES 8  STERILE (MISCELLANEOUS) ×2
SCOPETTES 8 STERILE (MISCELLANEOUS) ×2 IMPLANT
SUT VIC AB 2-0 SH 27 (SUTURE)
SUT VIC AB 2-0 SH 27XBRD (SUTURE) IMPLANT
SUT VIC AB 3-0 PS2 18 (SUTURE)
SUT VIC AB 3-0 PS2 18XBRD (SUTURE) IMPLANT
SYR CONTROL 10ML LL (SYRINGE) IMPLANT
TOWEL OR 17X24 6PK STRL BLUE (TOWEL DISPOSABLE) ×4 IMPLANT
TRAY DSU PREP LF (CUSTOM PROCEDURE TRAY) ×2 IMPLANT
TUBE CONNECTING 12X1/4 (SUCTIONS) ×2 IMPLANT
VACUUM HOSE 7/8X10 W/ WAND (MISCELLANEOUS) IMPLANT
VACUUM HOSE/TUBING 7/8INX6FT (MISCELLANEOUS) ×2 IMPLANT
WATER STERILE IRR 500ML POUR (IV SOLUTION) ×2 IMPLANT
YANKAUER SUCT BULB TIP NO VENT (SUCTIONS) ×2 IMPLANT

## 2016-01-04 NOTE — Op Note (Signed)
OPERATIVE NOTE 01/04/16  PATIENT: [name] [id number] DATE: 01/04/16   Preop Diagnosis: vain 2  Postoperative Diagnosis: VAIN 2  Surgery: Partial simple CO2 laser of upper vagina   Surgeons:  Donaciano Eva, MD Assistant: none  Anesthesia: General   Estimated blood loss: minimal  IVF:  128ml   Urine output: 50 ml   Complications: None   Pathology: none    Operative findings: acetowhite changes to 1.5cm area of proximal right vaginal side wall and 2cm area of left vaginal apex  Procedure: The patient was identified in the preoperative holding area. Informed consent was signed on the chart. Patient was seen history was reviewed and exam was performed.   The patient was then taken to the operating room and placed in the supine position with SCD hose on. General anesthesia was then induced without difficulty. She was then placed in the dorsolithotomy position. The perineum was prepped with Betadine. The vagina was prepped with Betadine. The patient was then draped after the prep was dried. A Foley catheter was inserted into the bladder under sterile conditions to straight cath the bladder.  Timeout was performed the patient, procedure, antibiotic, allergy, and length of procedure. 5% acetic acid solution was applied to the vagina. A coated speculum was placed and the vagina was inspected in its entirety for lesions which were identified as stated above. All staff and patient were fitted with the appropriate laser eye wear and mask. The patient was draped with wet cloths. The laser was tested for accuracy and set to 12 watts continuous.   CO2 laser ablation of the upper vagina including all acetowhite areas took place. The eschar was wiped with a moistened scopette to ensure adequate depth of ablation.   All instrument, suture, laparotomy, Ray-Tec, and needle counts were correct x2. The patient tolerated the procedure well and was taken recovery room in stable condition. This  is Everitt Amber dictating an operative note on Regina Huber. Donaciano Eva, MD

## 2016-01-04 NOTE — H&P (Signed)
Consult was requested by Dr. Julien Girt for the evaluation of Regina Huber 55 y.o. female with Markus Daft  CC:  Chief Complaint  Patient presents with  . high grade vaginal dysplasia    New Consult    Assessment/Plan:  Ms. 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 I discussed the high risk for recurrence of this disease after treatment. I discussed the need for ongoing surveillance.  I'm recommending vaginal laser under anesthesia. I described the procedure and its risks. I described anticipated postoperative recovery.  Of note Amanni has a history of myasthenia gravis, and we will take this into consideration on the day of surgery with anesthesia.   HPI: Regina Huber is a very pleasant woman who is seen in consultation at the request of Dr Julien Girt for Goldonna on biopsy.  She has a life long history of normal paps. She has a remote (15 years ago) hx of an abdominal hysterectomy (total) for menorrhagia. She continued to have routine pap surveillance after hysterectomy (annually).  In September 2016 she had a pap which revealed LGSIL/VAIN1 with HPV.  A colposcopy of the vagina was performed by Dr Julien Girt and a biopsy of what appeared to be an atrophic lesion occurred on 09/28/15. This revealed VAIN2.  The patient has had a history of only 2 sexual partners. She divorced several years ago and is in a new relationship.   Current Meds:  Outpatient Encounter Prescriptions as of 10/20/2015  Medication Sig  . ALPRAZolam (XANAX) 0.25 MG tablet TAKE 1 TABLET BY MOUTH ONCE DAILY AT BEDTIME AS NEEDED  . citalopram (CELEXA) 40 MG tablet Take 1 tablet (40 mg total) by mouth daily.  . hydrochlorothiazide (HYDRODIURIL) 25 MG tablet TAKE 1 TABLET BY MOUTH DAILY.  . pravastatin (PRAVACHOL) 20 MG tablet TAKE 1 TABLET BY MOUTH ONCE DAILY **NEED APPOINTMENT FOR ADDITIONAL REFILLS**  .  [DISCONTINUED] ipratropium (ATROVENT) 0.06 % nasal spray Place 2 sprays into both nostrils 4 (four) times daily.  . [DISCONTINUED] traZODone (DESYREL) 50 MG tablet Take 0.5 tablets (25 mg total) by mouth at bedtime as needed for sleep.  . [DISCONTINUED] Vitamin D, Ergocalciferol, (DRISDOL) 50000 UNITS CAPS capsule Take 1 capsule (50,000 Units total) by mouth every 7 (seven) days.   No facility-administered encounter medications on file as of 10/20/2015.    Allergy:  Allergies  Allergen Reactions  . Penicillins     Rash- has tolerated cephalosporins    Social Hx:  Social History   Social History  . Marital Status: Married    Spouse Name: N/A  . Number of Children: N/A  . Years of Education: N/A   Occupational History  . Not on file.   Social History Main Topics  . Smoking status: Never Smoker   . Smokeless tobacco: Not on file  . Alcohol Use: No  . Drug Use: No  . Sexual Activity: Not on file   Other Topics Concern  . Not on file   Social History Narrative   Married, twin daughters born 67, son born 1988   Works at [employer] in [employer], mining.    Past Surgical Hx:  Past Surgical History  Procedure Laterality Date  . Cesarean section    . Thymactomy  2009  . Reconstruction adbominal wall    . Abdominal hysterectomy      Past Medical Hx:  Past Medical History  Diagnosis Date  .  Hypertension   . Hyperlipidemia   . Depression   . Myasthenia gravis ([place]) 2008    Past Gynecological History: No LMP recorded. Patient has had a hysterectomy.  Family Hx:  Family History  Problem Relation Age of Onset  . Cancer Mother 91    breast CA  . Hypertension Mother   . Diabetes Mother   . Cancer Father 53    colon CA  . Alcohol abuse Father   . Hypertension Father   . Stroke Father   . Heart disease Brother 47    MI     Review of Systems:  Constitutional  Feels well,  ENT Normal appearing ears and nares bilaterally Skin/Breast  No rash, sores, jaundice, itching, dryness Cardiovascular  No chest pain, shortness of breath, or edema  Pulmonary  No cough or wheeze.  Gastro Intestinal  No nausea, vomitting, or diarrhoea. No bright red blood per rectum, no abdominal pain, change in bowel movement, or constipation.  Genito Urinary  No frequency, urgency, dysuria, see HPI Musculo Skeletal  No myalgia, arthralgia, joint swelling or pain  Neurologic  No weakness, numbness, change in gait,  Psychology  No depression, anxiety, insomnia.   Vitals: Blood pressure 144/81, pulse 66, temperature 98.3 F (36.8 C), temperature source Oral, resp. rate 18, height 5\' 7"  (1.702 m), weight 204 lb (92.534 kg), SpO2 99 %.  Physical Exam: WD in NAD Neck  Supple NROM, without any enlargements.  Psychiatry  Alert and oriented to person, place, and time  Abdomen  Normoactive bowel sounds, abdomen soft, non-tender and thin without evidence of hernia.  Back No CVA tenderness Genito Urinary  Vulva/vagina: Normal external female genitalia. No lesions. No discharge or bleeding. Bladder/urethra: No lesions or masses, well supported bladder Vagina: slightly erythematous tissue measuring 2cm at the apex of the vaginal cuff. Cervix: surgically absent Uterus: surgically absent  Adnexa: no masses. Rectal  deferred Extremities  No bilateral cyanosis, clubbing or edema.      Donaciano Eva, MD

## 2016-01-04 NOTE — Anesthesia Procedure Notes (Addendum)
Procedure Name: LMA Insertion Date/Time: 01/04/2016 11:05 AM Performed by: Wanita Chamberlain Pre-anesthesia Checklist: Patient identified, Timeout performed, Emergency Drugs available, Suction available and Patient being monitored Patient Re-evaluated:Patient Re-evaluated prior to inductionOxygen Delivery Method: Circle system utilized Preoxygenation: Pre-oxygenation with 100% oxygen Intubation Type: IV induction Ventilation: Mask ventilation without difficulty LMA: LMA inserted LMA Size: 4.0 Number of attempts: 1 Airway Equipment and Method: Bite block Placement Confirmation: positive ETCO2 and breath sounds checked- equal and bilateral Tube secured with: Tape Dental Injury: Teeth and Oropharynx as per pre-operative assessment  Comments: Pt does have limited neck extension/mobility

## 2016-01-04 NOTE — Transfer of Care (Signed)
Immediate Anesthesia Transfer of Care Note  Patient: Regina Huber  Procedure(s) Performed: Procedure(s): CO2 LASER VAPORIZATION OF THE VAGINA (N/A)  Patient Location: PACU  Anesthesia Type:General  Level of Consciousness: awake, alert , oriented and patient cooperative  Airway & Oxygen Therapy: Patient Spontanous Breathing and Patient connected to nasal cannula oxygen  Post-op Assessment: Report given to RN and Post -op Vital signs reviewed and stable  Post vital signs: Reviewed and stable  Last Vitals:  Filed Vitals:   01/04/16 0827  BP: 130/70  Pulse: 68  Temp: 36.8 C  Resp: 16    Complications: No apparent anesthesia complications

## 2016-01-04 NOTE — Anesthesia Preprocedure Evaluation (Addendum)
Anesthesia Evaluation  Patient identified by MRN, date of birth, ID band Patient awake    Reviewed: Allergy & Precautions, NPO status , Patient's Chart, lab work & pertinent test results  Airway Mallampati: III  TM Distance: >3 FB Neck ROM: Full    Dental  (+) Dental Advisory Given, Caps,    Pulmonary neg pulmonary ROS,    breath sounds clear to auscultation       Cardiovascular Exercise Tolerance: Good METS: 5 - 7 Mets hypertension, Pt. on medications  Rhythm:Regular Rate:Normal  Pt with recent bout of palpitations at night while lying down in setting of recent family stress. Seen by PCP with negative troponin, negative BNP. Pt endorses walking 1+ miles around neighborhood up [place] without SOB and denies ever having angina. Can climb 2 flights of stairs without sx's. Pt to have stress echo, however sx's do not appear to be cardiac in nature based on history. 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   Neuro/Psych Depression  Neuromuscular disease (Myasthenia gravis s/p thymectomy on no medications.)    GI/Hepatic negative GI ROS, Neg liver ROS,   Endo/Other  negative endocrine ROS  Renal/GU negative Renal ROS     Musculoskeletal   Abdominal   Peds  Hematology negative hematology ROS (+)   Anesthesia Other Findings   Reproductive/Obstetrics                         Lab Results  Component Value Date   WBC 8.6 12/22/2015   HGB 14.4 12/22/2015   HCT 43.3 12/22/2015   MCV 84.6 12/22/2015   PLT 326.0 12/22/2015   Lab Results  Component Value Date   CREATININE 0.85 12/22/2015   BUN 12 12/22/2015   NA 140 12/22/2015   K 3.5 12/22/2015   CL 100 12/22/2015   CO2 32 12/22/2015    Anesthesia Physical Anesthesia Plan  ASA: II  Anesthesia Plan: General   Post-op Pain Management:    Induction: Intravenous  Airway Management  Planned: LMA  Additional Equipment:   Intra-op Plan:   Post-operative Plan: Extubation in OR  Informed Consent: I have reviewed the patients History and Physical, chart, labs and discussed the procedure including the risks, benefits and alternatives for the proposed anesthesia with the patient or authorized representative who has indicated his/her understanding and acceptance.   Dental advisory given  Plan Discussed with: CRNA and Anesthesiologist  Anesthesia Plan Comments:        Anesthesia Quick Evaluation

## 2016-01-04 NOTE — Discharge Instructions (Signed)
Vaginal Laser, Care After  ACTIVITY  Rest as much as possible the first two days after discharge.  Arrange to have help from family or others with your daily activities when you go home.  If you feel tired, balance your activity with rest periods.  No restrictions on lifting, stairs or exercise.   LEG AND FOOT CARE If your doctor has removed lymph nodes from your groin area, there may be an increase in swelling of your legs and feet. You can help prevent swelling by doing the following:  Elevate your legs while sitting or lying down.  If your caregiver has ordered special stockings, wear them according to instructions.  Avoid standing in one place for long periods of time.  Call the physical therapy department if you have any questions about swelling or treatment for swelling.  Avoid salt in your diet. It can cause fluid retention and swelling.  Do not cross your legs, especially when sitting. NUTRITION  You may resume your normal diet.  Drink 6 to 8 glasses of fluids a day.  Eat a healthy, balanced diet including portions of food from the meat (protein), milk, fruit, vegetable, and bread groups.  Your caregiver may recommend you take a multivitamin with iron. ELIMINATION  If constipation occurs, drink more liquids, and add more fruits, vegetables, and bran to your diet. You may take a mild laxative, such as Milk of Magnesia, Metamucil, or a stool softener such as Colace, with permission from your caregiver. HYGIENE  You may shower and wash your hair.  Check with your caregiver about tub baths.  Do not add any bath oils or chemicals to your bath water, after you have permission to take baths.  Clean yourself well after moving your bowels.  You do not need to apply dressings, salves or lotions to the wound.  You should apply premarin cream vaginally at night for 4 weeks postoperatively.  HOME CARE INSTRUCTIONS   Take your temperature twice a day and record it,  especially if you feel feverish or have chills.  Follow your caregiver's instructions about medicines, activity, and follow-up appointments after surgery.  Do not drink alcohol while taking pain medicine.  You may take over-the-counter medicine for pain, recommended by your caregiver.  If your pain is not relieved with medicine, call your caregiver.  Do not douche or use tampons (use a nonperfumed sanitary pad).  Do not have sexual intercourse until your caregiver gives you permission (typically 4weeks postoperatively). Hugging, kissing, and playful sexual activity is fine with your caregiver's permission.  Warm sitz baths, with your caregiver's permission, are helpful to control swelling and discomfort.  Take showers instead of baths, until your caregiver gives you permission to take baths.  You may take a mild medicine for constipation, recommended by your caregiver. Bran foods and drinking a lot of fluids will help with constipation.  Make sure your family understands everything about your operation and recovery. SEEK MEDICAL CARE IF:   You notice swelling and redness around the wound area.  You notice a foul smell coming from the wound or on the surgical dressing.  You notice the wound is separating.  You have painful or bloody urination.  You develop nausea and vomiting.  You develop diarrhea.  You develop a rash.  You have a reaction or allergy from the medicine.  You feel dizzy or light-headed.  You need stronger pain medicine. SEEK IMMEDIATE MEDICAL CARE IF:   You develop a temperature of 102 F (38.9 C)  or higher.  You pass out.  You develop leg or chest pain.  You develop abdominal pain.  You develop shortness of breath.  You develop bleeding from the wound area.  You see pus in the wound area. MAKE SURE YOU:   Understand these instructions.  Will watch your condition.  Will get help right away if you are not doing well or get worse. Document  Released: 07/16/2004 Document Revised: 04/18/2014 Document Reviewed: 11/03/2009 [facility] Patient Information 2015 [place], [place]. This information is not intended to replace advice given to you by your health care provider. Make sure you discuss any questions you have with your health care provider.   Post Anesthesia Home Care Instructions  Activity: Get plenty of rest for the remainder of the day. A responsible adult should stay with you for 24 hours following the procedure.  For the next 24 hours, DO NOT: -Drive a car -Paediatric nurse -Drink alcoholic beverages -Take any medication unless instructed by your physician -Make any legal decisions or sign important papers.  Meals: Start with liquid foods such as gelatin or soup. Progress to regular foods as tolerated. Avoid greasy, spicy, heavy foods. If nausea and/or vomiting occur, drink only clear liquids until the nausea and/or vomiting subsides. Call your physician if vomiting continues.  Special Instructions/Symptoms: Your throat may feel dry or sore from the anesthesia or the breathing tube placed in your throat during surgery. If this causes discomfort, gargle with warm salt water. The discomfort should disappear within 24 hours.  If you had a scopolamine patch placed behind your ear for the management of post- operative nausea and/or vomiting:  1. The medication in the patch is effective for 72 hours, after which it should be removed.  Wrap patch in a tissue and discard in the trash. Wash hands thoroughly with soap and water. 2. You may remove the patch earlier than 72 hours if you experience unpleasant side effects which may include dry mouth, dizziness or visual disturbances. 3. Avoid touching the patch. Wash your hands with soap and water after contact with the patch.

## 2016-01-05 ENCOUNTER — Encounter (HOSPITAL_BASED_OUTPATIENT_CLINIC_OR_DEPARTMENT_OTHER): Payer: Self-pay | Admitting: Gynecologic Oncology

## 2016-01-05 NOTE — Anesthesia Postprocedure Evaluation (Signed)
Anesthesia Post Note  Patient: Regina Huber  Procedure(s) Performed: Procedure(s) (LRB): CO2 LASER VAPORIZATION OF THE VAGINA (N/A)  Patient location during evaluation: PACU Anesthesia Type: General Level of consciousness: awake and alert Pain management: pain level controlled Vital Signs Assessment: post-procedure vital signs reviewed and stable Respiratory status: spontaneous breathing Cardiovascular status: blood pressure returned to baseline Anesthetic complications: no    Last Vitals:  Filed Vitals:   01/04/16 1230 01/04/16 1314  BP: 135/85 126/77  Pulse: 66 71  Temp:  37 C  Resp: 13 14    Last Pain: There were no vitals filed for this visit.               Tiajuana Amass

## 2016-01-12 NOTE — Addendum Note (Signed)
Addended by: Eliezer Lofts E on: 01/12/2016 05:04 PM   Modules accepted: Orders

## 2016-01-18 ENCOUNTER — Other Ambulatory Visit: Payer: Self-pay | Admitting: Family Medicine

## 2016-01-18 MED FILL — HYDROCHLOROTHIAZIDE 25 MG T: 25 | 90 days supply | Qty: 90 | Fill #0

## 2016-01-19 ENCOUNTER — Other Ambulatory Visit: Payer: 59

## 2016-01-26 MED FILL — PREMARIN VAGINAL CREAM-APPL: 0.625 | 15 days supply | Qty: 30 | Fill #0

## 2016-01-29 ENCOUNTER — Ambulatory Visit: Payer: 59 | Admitting: Gynecologic Oncology

## 2016-02-05 ENCOUNTER — Ambulatory Visit: Payer: 59 | Attending: Gynecologic Oncology | Admitting: Gynecologic Oncology

## 2016-02-05 ENCOUNTER — Encounter: Payer: Self-pay | Admitting: Gynecologic Oncology

## 2016-02-05 VITALS — BP 133/83 | HR 71 | Temp 98.1°F | Resp 18 | Ht 67.0 in | Wt 206.0 lb

## 2016-02-05 DIAGNOSIS — Z9889 Other specified postprocedural states: Secondary | ICD-10-CM | POA: Diagnosis not present

## 2016-02-05 DIAGNOSIS — Z09 Encounter for follow-up examination after completed treatment for conditions other than malignant neoplasm: Secondary | ICD-10-CM | POA: Diagnosis not present

## 2016-02-05 DIAGNOSIS — N891 Moderate vaginal dysplasia: Secondary | ICD-10-CM | POA: Insufficient documentation

## 2016-02-05 NOTE — Patient Instructions (Signed)
Please call for any questions or concerns. 

## 2016-02-05 NOTE — Progress Notes (Signed)
POSTOPERATIVE FOLLOW-UP  HPI:  Regina Huber is a 55 y.o. year old No obstetric history on file. initially seen in consultation on 10/20/15 referred by Dr Regina Huber for [place].  She then underwent a CO2 laser of the upper vagina  on [id number] without complications.  Her postoperative course was uncomplicated.  She is seen today for a postoperative check and to discuss her pathology results and ongoing plan.  Since discharge from the hospital, she is feeling well.  She has no pain or discharge. She is using vaginal premarin. She has no other complaints today.    Review of systems: Constitutional:  She has no weight gain or weight loss. She has no fever or chills. Eyes: No blurred vision Ears, Nose, Mouth, Throat: No dizziness, headaches or changes in hearing. No mouth sores. Cardiovascular: No chest pain, palpitations or edema. Respiratory:  No shortness of breath, wheezing or cough Gastrointestinal: She has normal bowel movements without diarrhea or constipation. She denies any nausea or vomiting. She denies blood in her stool or heart burn. Genitourinary:  She denies pelvic pain, pelvic pressure or changes in her urinary function. She has no hematuria, dysuria, or incontinence. She has no irregular vaginal bleeding or vaginal discharge Musculoskeletal: Denies muscle weakness or joint pains.  Skin:  She has no skin changes, rashes or itching Neurological:  Denies dizziness or headaches. No neuropathy, no numbness or tingling. Psychiatric:  She denies depression or anxiety. Hematologic/Lymphatic:   No easy bruising or bleeding   Physical Exam: Blood pressure 133/83, pulse 71, temperature 98.1 F (36.7 C), temperature source Oral, resp. rate 18, height 5\' 7"  (1.702 m), weight 206 lb (93.441 kg), SpO2 98 %. General: Well dressed, well nourished in no apparent distress.   Genitourinary: Normal EGBUS  Vaginal cuff well healed with no evidence of residual laser scar. Extremities: No cyanosis, clubbing  or edema.  No calf tenderness or erythema. No palpable cords. Psychiatric: Mood and affect are appropriate. Neurological: Awake, alert and oriented x 3. Sensation is intact, no neuropathy.  Musculoskeletal: No pain, normal strength and range of motion.  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   S/p CO2 laser on 01/04/16.   Plan: 1) Treatment counseling - I discussed the high risk for recurrence of dysplastic lesions. I recommend follow-up with repeat pap in 6 months with Dr Regina Huber, then triaging according to ASCCP guidelines accordingly She was given the opportunity to ask questions, which were answered to her satisfaction, and she is agreement with the above mentioned plan of care.  2)  Return to clinic to see Dr Regina Huber for pap in August 2017 and to see me on a prn basis (eg if additional procedures are required.  3) she can stop using premarin cream at this point.  Regina Eva, MD

## 2016-02-20 MED FILL — AMOXICILLIN 500 MG CAPSULE: 500 | 10 days supply | Qty: 30 | Fill #0

## 2016-02-20 MED FILL — FLUTICASONE PROP 50 MCG SPR: 50 | 30 days supply | Qty: 16 | Fill #0

## 2016-02-21 ENCOUNTER — Other Ambulatory Visit: Payer: Self-pay | Admitting: Family Medicine

## 2016-02-21 MED FILL — PRAVASTATIN SODIUM 20 MG TA: 20 | 30 days supply | Qty: 30 | Fill #0

## 2016-02-21 MED FILL — CITALOPRAM HBR 40 MG TABLET: 40 | 90 days supply | Qty: 90 | Fill #0

## 2016-02-21 NOTE — Telephone Encounter (Signed)
Lm on pts vm and advised she is needing OV with labs. Pt informed that Rx to be filled for #30, and no additional refills to be granted until seen.

## 2016-02-21 NOTE — Telephone Encounter (Signed)
Last lab 07/2015-abnormal. Pt advised with last Rx, lab needed. pls advise

## 2016-02-21 NOTE — Telephone Encounter (Signed)
Ok to refill one time only.  We cannot continue to refill if she does not keep lab appt.

## 2016-02-28 ENCOUNTER — Encounter: Payer: Self-pay | Admitting: Family Medicine

## 2016-02-28 DIAGNOSIS — L259 Unspecified contact dermatitis, unspecified cause: Secondary | ICD-10-CM | POA: Diagnosis not present

## 2016-02-28 DIAGNOSIS — L820 Inflamed seborrheic keratosis: Secondary | ICD-10-CM | POA: Diagnosis not present

## 2016-02-28 MED FILL — TRIAMCINOLONE 0.1% CREAM: 0.1 | 30 days supply | Qty: 454 | Fill #0

## 2016-03-07 ENCOUNTER — Ambulatory Visit: Payer: 59 | Admitting: Family Medicine

## 2016-03-13 ENCOUNTER — Ambulatory Visit: Payer: 59 | Admitting: Family Medicine

## 2016-03-13 ENCOUNTER — Telehealth: Payer: Self-pay | Admitting: Family Medicine

## 2016-03-13 ENCOUNTER — Telehealth: Payer: Self-pay

## 2016-03-13 NOTE — Telephone Encounter (Signed)
S/w pt regarding opening for stress echo as ordered by Eliezer Lofts at Jan OV. Pt states she will have to wait at this time as she is having "some issues with my mother" and is unable schedule. Indicates she will call when ready to schedule.

## 2016-03-13 NOTE — Telephone Encounter (Signed)
Patient did not come for their scheduled appointment today Cholesterol medication follow up and possibly shingles vaccine if approved by insurancey .  Please let me know if the patient needs to be contacted immediately for follow up or if no follow up is necessary.

## 2016-03-25 ENCOUNTER — Ambulatory Visit (INDEPENDENT_AMBULATORY_CARE_PROVIDER_SITE_OTHER): Payer: 59 | Admitting: Family Medicine

## 2016-03-25 ENCOUNTER — Encounter: Payer: Self-pay | Admitting: Family Medicine

## 2016-03-25 VITALS — BP 120/72 | HR 62 | Temp 97.9°F | Wt 210.0 lb

## 2016-03-25 DIAGNOSIS — F32A Depression, unspecified: Secondary | ICD-10-CM

## 2016-03-25 DIAGNOSIS — G4700 Insomnia, unspecified: Secondary | ICD-10-CM

## 2016-03-25 DIAGNOSIS — Z23 Encounter for immunization: Secondary | ICD-10-CM | POA: Diagnosis not present

## 2016-03-25 DIAGNOSIS — F329 Major depressive disorder, single episode, unspecified: Secondary | ICD-10-CM | POA: Diagnosis not present

## 2016-03-25 DIAGNOSIS — I10 Essential (primary) hypertension: Secondary | ICD-10-CM | POA: Diagnosis not present

## 2016-03-25 DIAGNOSIS — G7000 Myasthenia gravis without (acute) exacerbation: Secondary | ICD-10-CM

## 2016-03-25 DIAGNOSIS — E785 Hyperlipidemia, unspecified: Secondary | ICD-10-CM | POA: Diagnosis not present

## 2016-03-25 LAB — COMPREHENSIVE METABOLIC PANEL
ALT: 21 U/L (ref 0–35)
AST: 18 U/L (ref 0–37)
Albumin: 4.3 g/dL (ref 3.5–5.2)
Alkaline Phosphatase: 88 U/L (ref 39–117)
BUN: 13 mg/dL (ref 6–23)
CO2: 32 mEq/L (ref 19–32)
Calcium: 9.9 mg/dL (ref 8.4–10.5)
Chloride: 97 mEq/L (ref 96–112)
Creatinine, Ser: 0.9 mg/dL (ref 0.40–1.20)
GFR: 69.19 mL/min (ref >60.00)
Glucose, Bld: 109 mg/dL — ABNORMAL HIGH (ref 70–99)
Potassium: 3.1 mEq/L — ABNORMAL LOW (ref 3.5–5.1)
Sodium: 138 mEq/L (ref 135–145)
Total Bilirubin: 0.6 mg/dL (ref 0.2–1.2)
Total Protein: 7.8 g/dL (ref 6.0–8.3)

## 2016-03-25 LAB — CBC WITH DIFFERENTIAL/PLATELET
Basophils Absolute: 0 10*3/uL (ref 0.0–0.1)
Basophils Relative: 0.5 % (ref 0.0–3.0)
Eosinophils Absolute: 0.2 10*3/uL (ref 0.0–0.7)
Eosinophils Relative: 2.4 % (ref 0.0–5.0)
HCT: 43.3 % (ref 36.0–46.0)
Hemoglobin: 14.8 g/dL (ref 12.0–15.0)
Lymphocytes Relative: 22.6 % (ref 12.0–46.0)
Lymphs Abs: 2.1 10*3/uL (ref 0.7–4.0)
MCHC: 34.1 g/dL (ref 30.0–36.0)
MCV: 83.1 fl (ref 78.0–100.0)
Monocytes Absolute: 0.5 10*3/uL (ref 0.1–1.0)
Monocytes Relative: 5.1 % (ref 3.0–12.0)
Neutro Abs: 6.3 10*3/uL (ref 1.4–7.7)
Neutrophils Relative %: 69.4 % (ref 43.0–77.0)
Platelets: 312 10*3/uL (ref 150.0–400.0)
RBC: 5.21 Mil/uL — ABNORMAL HIGH (ref 3.87–5.11)
RDW: 13.7 % (ref 11.5–15.5)
WBC: 9.1 10*3/uL (ref 4.0–10.5)

## 2016-03-25 LAB — LIPID PANEL
Cholesterol: 198 mg/dL (ref 0–200)
HDL: 45.8 mg/dL (ref >39.00)
NonHDL: 152.28
Total CHOL/HDL Ratio: 4
Triglycerides: 216 mg/dL — ABNORMAL HIGH (ref 0.0–149.0)
VLDL: 43.2 mg/dL — ABNORMAL HIGH (ref 0.0–40.0)

## 2016-03-25 LAB — LDL CHOLESTEROL, DIRECT: Direct LDL: 116 mg/dL

## 2016-03-25 LAB — TSH: TSH: 1.34 u[IU]/mL (ref 0.35–4.50)

## 2016-03-25 MED ORDER — ALPRAZOLAM 0.25 MG PO TABS: ORAL_TABLET | ORAL | Status: DC

## 2016-03-25 NOTE — Progress Notes (Signed)
Pre visit review using our clinic review tool, if applicable. No additional management support is needed unless otherwise documented below in the visit note. 

## 2016-03-25 NOTE — Progress Notes (Signed)
Subjective:    Patient ID: Regina Huber, female    DOB: 09-06-1961, 55 y.o.   MRN: [id number]  HPI  55 yo here for follow up.  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  DOing well.   MG- diagnosed in 2008, s/p thymectomy.  Still has not re established with a neurologist.  Used to see Dr. Erling Cruz but he retired.  HTN-  Has been stable on HCTZ 25 mg daily. She has no h/o CAD.  No h/o palpitations.  Does have some LE edema, always has had some. Lab Results  Component Value Date   CREATININE 0.70 01/04/2016   HLD- on pravachol 20 mg daily.  Denies any myalgias.  Lab Results  Component Value Date   CHOL 167 07/18/2015   HDL 46.90 07/18/2015   LDLCALC 90 07/18/2015   LDLDIRECT 177.4 03/08/2013   TRIG 153.0* 07/18/2015   CHOLHDL 4 07/18/2015     Depression- has been on celexa 40 mg daily for years. No SI or HI. Does use xanax a couple of times a week for insomnia.  Patient Active Problem List   Diagnosis Date Noted  . 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 Palpitations 12/22/2015  . Nonspecific abnormal electrocardiogram (ECG) (EKG) 12/22/2015  . Peripheral edema 12/22/2015  . Dyspnea on exertion 12/22/2015  . Insomnia 07/18/2015  . Fatigue 07/18/2015  . MG (myasthenia gravis) ([place]) 10/31/2014  . Hypertension   . Hyperlipidemia   . Depression    Past Medical History  Diagnosis Date  . Hypertension   . Hyperlipidemia   . Depression   . Vaginal intraepithelial neoplasia II (VAIN II)   . Palpitations   . Myasthenia gravis [facility])     dx 2005   s/p  thymectomy 2006--  per pt no other symtpoms since   Past Surgical History  Procedure Laterality Date  . Cesarean section  [place]  . Transsternal thymectomy  04-29-2005  . Abdominal hysterectomy  05-07-2000    w/  Right Ovarian Cystectomy  . Laparotomy/  suture intraperitoneal bleeding  05-08-2000  . Combined abdominoplasty and liposuction  03-12-2002  . Lumbar disc surgery  11-07-2010     L5 - S1  . Co2 laser application N/A [id number]    Procedure: CO2 LASER VAPORIZATION OF THE VAGINA;  Surgeon: Everitt Amber, MD;  Location: [facility];  Service: Gynecology;  Laterality: N/A;   Social History  Substance Use Topics  . Smoking status: Never Smoker   . Smokeless tobacco: Never Used  . Alcohol Use: No   Family History  Problem Relation Age of Onset  . Cancer Mother 60    breast CA  . Hypertension Mother   . Diabetes Mother   . Cancer Father 92    colon CA  . Alcohol abuse Father   . Hypertension Father   . Stroke Father   . Heart disease Brother 58    MI   Allergies  Allergen Reactions  . Penicillins Rash    - has tolerated cephalosporins   Current Outpatient Prescriptions on File Prior to Visit  Medication Sig Dispense Refill  . ALPRAZolam (XANAX) 0.25 MG tablet TAKE 1 TABLET BY MOUTH ONCE DAILY AT BEDTIME AS NEEDED 30 tablet 0  . citalopram (CELEXA) 40 MG tablet TAKE 1 TABLET BY MOUTH ONCE DAILY 90 tablet 1  . conjugated estrogens (PREMARIN) vaginal cream Place 1 Applicatorful vaginally daily. 42.5 g 1  . docusate sodium (COLACE)  100 MG capsule Take 1 capsule (100 mg total) by mouth 2 (two) times daily. 10 capsule 0  . hydrochlorothiazide (HYDRODIURIL) 25 MG tablet TAKE 1 TABLET BY MOUTH DAILY. 90 tablet 1  . oxyCODONE-acetaminophen (PERCOCET) 5-325 MG tablet Take 1-2 tablets by mouth every 4 (four) hours as needed for severe pain. 30 tablet 0  . pravastatin (PRAVACHOL) 20 MG tablet TAKE 1 TABLET BY MOUTH ONCE DAILY **NEED APPOINTMENT FOR ADDITIONAL REFILLS** 30 tablet 0   No current facility-administered medications on file prior to visit.   The PMH, PSH, Social History, Family History, Medications, and allergies have been reviewed in [facility], and have been updated if relevant.   Review of Systems  Constitutional: Negative for fatigue.  Eyes: Negative.   Respiratory: Negative.   Cardiovascular: Negative.   Gastrointestinal: Negative.    Endocrine: Negative.   Genitourinary: Negative.   Musculoskeletal: Negative.   Skin: Negative.   Psychiatric/Behavioral: Negative for suicidal ideas, sleep disturbance and dysphoric mood.   See HPI      Objective:   Physical Exam BP 120/72 mmHg  Pulse 62  Temp(Src) 97.9 F (36.6 C)  Wt 210 lb (95.255 kg)  General:  Well-developed,well-nourished,in no acute distress; alert,appropriate and cooperative throughout examination Head:  normocephalic and atraumatic.   Eyes:  vision grossly intact, pupils equal, pupils round, and pupils reactive to light.   Ears:  R ear normal and L ear normal.   Nose:  no external deformity.   Mouth:  good dentition.   Neck:  No deformities, masses, or tenderness noted. Lungs:  Normal respiratory effort, chest expands symmetrically. Lungs are clear to auscultation, no crackles or wheezes. Heart:  Normal rate and regular rhythm. S1 and S2 normal without gallop, murmur, click, rub or other extra sounds. Msk:  No deformity or scoliosis noted of thoracic or lumbar spine.   Extremities:  No clubbing, cyanosis, edema, or deformity noted with normal full range of motion of all joints.   Neurologic:  alert & oriented X3 and gait normal.   Skin:  Intact without suspicious lesions or rashes Cervical Nodes:  No lymphadenopathy noted Axillary Nodes:  No palpable lymphadenopathy Psych:  Cognition and judgment appear intact. Alert and cooperative with normal attention span and concentration. No apparent delusions, illusions, hallucinations      Assessment & Plan:

## 2016-03-25 NOTE — Assessment & Plan Note (Signed)
Continue current dose of statin. Check labs today. 

## 2016-03-25 NOTE — Addendum Note (Signed)
Addended by: Inocencio Homes on: 03/25/2016 08:33 AM   Modules accepted: Orders

## 2016-03-25 NOTE — Assessment & Plan Note (Signed)
Well controlled on current rx. No changes made. 

## 2016-03-25 NOTE — Assessment & Plan Note (Signed)
Uses rare xanax Rx printed and given to pt.

## 2016-03-25 NOTE — Assessment & Plan Note (Signed)
Stable on current dose of celexa. No changes made to rxs today.

## 2016-04-04 ENCOUNTER — Other Ambulatory Visit: Payer: Self-pay | Admitting: Family Medicine

## 2016-04-04 MED FILL — HYDROCHLOROTHIAZIDE 25 MG T: 25 | 90 days supply | Qty: 90 | Fill #1

## 2016-04-04 NOTE — Addendum Note (Signed)
Addended by: Modena Nunnery on: 04/04/2016 03:22 PM   Modules accepted: Orders

## 2016-04-05 MED FILL — PRAVASTATIN SODIUM 20 MG TA: 20 | 90 days supply | Qty: 90 | Fill #0

## 2016-04-09 MED FILL — ALPRAZolam 0.25 MG TABS: 0.25 | 30 days supply | Qty: 30 | Fill #0

## 2016-05-23 ENCOUNTER — Encounter: Payer: Self-pay | Admitting: Family Medicine

## 2016-05-24 MED FILL — CITALOPRAM HBR 40 MG TABLET: 40 | 90 days supply | Qty: 90 | Fill #1

## 2016-06-11 ENCOUNTER — Other Ambulatory Visit: Payer: Self-pay | Admitting: Family Medicine

## 2016-06-11 NOTE — Telephone Encounter (Signed)
Last f/u 03/2016 

## 2016-06-12 MED FILL — ALPRAZolam 0.25 MG TABS: 0.25 | 30 days supply | Qty: 30 | Fill #0

## 2016-06-12 NOTE — Telephone Encounter (Signed)
Rx called in to requested pharmacy 

## 2016-07-12 ENCOUNTER — Other Ambulatory Visit: Payer: Self-pay | Admitting: *Deleted

## 2016-07-12 MED ORDER — ALPRAZOLAM 0.25 MG PO TABS
ORAL_TABLET | ORAL | 0 refills | Status: DC
Start: 2016-07-12 — End: 2016-08-27

## 2016-07-12 MED FILL — ALPRAZolam 0.25 MG TABS: 0.25 | 30 days supply | Qty: 30 | Fill #0

## 2016-07-12 MED FILL — PRAVASTATIN SODIUM 20 MG TA: 20 | 90 days supply | Qty: 90 | Fill #1

## 2016-07-12 NOTE — Telephone Encounter (Signed)
Last f/u 03/2016 

## 2016-07-12 NOTE — Telephone Encounter (Signed)
Rx called in to requested pharmacy 

## 2016-07-16 ENCOUNTER — Other Ambulatory Visit: Payer: Self-pay | Admitting: *Deleted

## 2016-07-16 MED ORDER — HYDROCHLOROTHIAZIDE 25 MG PO TABS: 25.0000 mg | ORAL_TABLET | Freq: Every day | ORAL | 2 refills | Status: DC

## 2016-07-16 MED FILL — HYDROCHLOROTHIAZIDE 25 MG T: 25 | 90 days supply | Qty: 90 | Fill #0

## 2016-08-27 ENCOUNTER — Other Ambulatory Visit: Payer: Self-pay | Admitting: Family Medicine

## 2016-08-27 NOTE — Telephone Encounter (Signed)
Last f/u 03/2016 

## 2016-08-28 MED FILL — ALPRAZolam 0.25 MG TABS: 0.25 | 30 days supply | Qty: 30 | Fill #0

## 2016-08-28 NOTE — Telephone Encounter (Signed)
Rx called in to requested pharmacy 

## 2016-09-06 ENCOUNTER — Other Ambulatory Visit: Payer: Self-pay | Admitting: Family Medicine

## 2016-09-06 MED FILL — CITALOPRAM HBR 40 MG TABLET: 40 | 90 days supply | Qty: 90 | Fill #0

## 2016-09-06 NOTE — Telephone Encounter (Signed)
Pt request status of citalopram refill to [facility] outpt pharmacy. Pt last seen 03/25/16. Refilled per protocol. Pt notified and voiced understanding.

## 2016-10-17 ENCOUNTER — Other Ambulatory Visit: Payer: Self-pay | Admitting: Family Medicine

## 2016-10-17 MED FILL — HYDROCHLOROTHIAZIDE 25 MG T: 25 | 90 days supply | Qty: 90 | Fill #1

## 2016-10-18 MED FILL — PRAVASTATIN SODIUM 20 MG TA: 20 | 90 days supply | Qty: 90 | Fill #0

## 2016-11-11 DIAGNOSIS — Z1329 Encounter for screening for other suspected endocrine disorder: Secondary | ICD-10-CM | POA: Diagnosis not present

## 2016-11-11 DIAGNOSIS — R87623 High grade squamous intraepithelial lesion on cytologic smear of vagina (HGSIL): Secondary | ICD-10-CM | POA: Diagnosis not present

## 2016-11-11 DIAGNOSIS — G4700 Insomnia, unspecified: Secondary | ICD-10-CM | POA: Diagnosis not present

## 2016-11-11 DIAGNOSIS — Z1231 Encounter for screening mammogram for malignant neoplasm of breast: Secondary | ICD-10-CM | POA: Diagnosis not present

## 2016-11-11 DIAGNOSIS — Z6833 Body mass index (BMI) 33.0-33.9, adult: Secondary | ICD-10-CM | POA: Diagnosis not present

## 2016-11-11 DIAGNOSIS — Z8742 Personal history of other diseases of the female genital tract: Secondary | ICD-10-CM | POA: Diagnosis not present

## 2016-11-11 DIAGNOSIS — R011 Cardiac murmur, unspecified: Secondary | ICD-10-CM | POA: Diagnosis not present

## 2016-11-11 DIAGNOSIS — Z01419 Encounter for gynecological examination (general) (routine) without abnormal findings: Secondary | ICD-10-CM | POA: Diagnosis not present

## 2016-11-11 LAB — TSH: TSH: 1.38 (ref 0.41–5.90)

## 2016-11-12 MED FILL — ZOLPIDEM TARTRATE 10 MG TAB: 10 | 30 days supply | Qty: 30 | Fill #0

## 2016-11-19 DIAGNOSIS — N893 Dysplasia of vagina, unspecified: Secondary | ICD-10-CM | POA: Diagnosis not present

## 2016-11-19 DIAGNOSIS — R87612 Low grade squamous intraepithelial lesion on cytologic smear of cervix (LGSIL): Secondary | ICD-10-CM | POA: Diagnosis not present

## 2016-11-21 ENCOUNTER — Other Ambulatory Visit: Payer: Self-pay | Admitting: Family Medicine

## 2016-11-21 NOTE — Telephone Encounter (Signed)
Last f/u 03/2016 

## 2016-11-22 MED FILL — ALPRAZolam 0.25 MG TABS: 0.25 | 30 days supply | Qty: 30 | Fill #0

## 2016-11-22 NOTE — Telephone Encounter (Signed)
Rx called in to requested pharmacy 

## 2016-12-02 ENCOUNTER — Encounter: Payer: Self-pay | Admitting: Interventional Cardiology

## 2016-12-02 ENCOUNTER — Ambulatory Visit (INDEPENDENT_AMBULATORY_CARE_PROVIDER_SITE_OTHER): Payer: 59 | Admitting: Interventional Cardiology

## 2016-12-02 VITALS — BP 132/82 | HR 68 | Ht 67.0 in | Wt 216.6 lb

## 2016-12-02 DIAGNOSIS — E7849 Other hyperlipidemia: Secondary | ICD-10-CM

## 2016-12-02 DIAGNOSIS — E784 Other hyperlipidemia: Secondary | ICD-10-CM | POA: Diagnosis not present

## 2016-12-02 DIAGNOSIS — R0609 Other forms of dyspnea: Secondary | ICD-10-CM | POA: Diagnosis not present

## 2016-12-02 DIAGNOSIS — R0683 Snoring: Secondary | ICD-10-CM | POA: Insufficient documentation

## 2016-12-02 DIAGNOSIS — R609 Edema, unspecified: Secondary | ICD-10-CM

## 2016-12-02 DIAGNOSIS — I10 Essential (primary) hypertension: Secondary | ICD-10-CM | POA: Diagnosis not present

## 2016-12-02 DIAGNOSIS — R0600 Dyspnea, unspecified: Secondary | ICD-10-CM

## 2016-12-02 NOTE — Patient Instructions (Addendum)
Medication Instructions:  None  Labwork: None  Testing/Procedures: Your physician has requested that you have an echocardiogram. Echocardiography is a painless test that uses sound waves to create images of your heart. It provides your doctor with information about the size and shape of your heart and how well your heart's chambers and valves are working. This procedure takes approximately one hour. There are no restrictions for this procedure.  Your physician has requested that you have en exercise stress myoview. For further information please visit [email or web address]. Please follow instruction sheet, as given.   Follow-Up: Your physician recommends that you schedule a follow-up appointment in: late January with Dr. Tamala Julian. (May use 10:45A END slot on 1/26 or 1/31)   Any Other Special Instructions Will Be Listed Below (If Applicable).     If you need a refill on your cardiac medications before your next appointment, please call your pharmacy.

## 2016-12-02 NOTE — Progress Notes (Addendum)
Cardiology Office Note    Date:  12/02/2016   ID:  Regina Huber, DOB 04-17-1961, MRN [id number]  PCP:  Arnette Norris, MD  Cardiologist: Sinclair Grooms, MD   Chief Complaint  Patient presents with  . Shortness of Breath  . Chest Pain    History of Present Illness:  Regina Huber is a 55 y.o. female referred from her OB/GYN for evaluation of heart murmur. She has a prior history of heart murmur and her complaints include exertional dyspnea and chest tightness.  Regina Huber is a [employer]. She is to daughter Mrs. Regina Huber one of the first patient's that I cared for in [place]. 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  Past Medical History:  Diagnosis Date  . Depression   . Hyperlipidemia   . Hypertension   . Myasthenia gravis ([place])    dx 2005   s/p  thymectomy 2006--  per pt no other symtpoms since  . Palpitations   . Vaginal intraepithelial neoplasia II (VAIN II)     Past Surgical History:  Procedure Laterality Date  . ABDOMINAL HYSTERECTOMY  05-07-2000   w/  Right Ovarian Cystectomy  . [place]  . CO2 LASER APPLICATION N/A 123456   Procedure: CO2 LASER VAPORIZATION OF THE VAGINA;  Surgeon: Everitt Amber, MD;  Location: [facility];  Service: Gynecology;  Laterality: N/A;  . COMBINED ABDOMINOPLASTY AND LIPOSUCTION  03-12-2002  . LAPAROTOMY/  SUTURE INTRAPERITONEAL BLEEDING  05-08-2000  . LUMBAR DISC SURGERY  11-07-2010   L5 - S1  . TRANSSTERNAL THYMECTOMY  04-29-2005    Current Medications: Outpatient Medications Prior to Visit  Medication Sig Dispense Refill  . citalopram (CELEXA) 40 MG tablet TAKE 1 TABLET BY MOUTH ONCE DAILY 90 tablet 1  . hydrochlorothiazide (HYDRODIURIL) 25 MG tablet Take 1 tablet (25 mg total) by mouth daily. 90 tablet 2  . pravastatin (PRAVACHOL) 20 MG tablet TAKE 1 TABLET BY MOUTH ONCE DAILY 90 tablet 1  . ALPRAZolam (XANAX) 0.25 MG tablet TAKE 1 TABLET BY MOUTH ONCE DAILY AT  BEDTIME AS NEEDED (Patient not taking: Reported on 12/02/2016) 30 tablet PRN  . docusate sodium (COLACE) 100 MG capsule Take 1 capsule (100 mg total) by mouth 2 (two) times daily. (Patient not taking: Reported on 12/02/2016) 10 capsule 0   No facility-administered medications prior to visit.      Allergies:   Penicillins   Social History   Social History  . Marital status: Married    Spouse name: N/A  . Number of children: N/A  . Years of education: N/A   Social History Main Topics  . Smoking status: Never Smoker  . Smokeless tobacco: Never Used  . Alcohol use No  . Drug use: No  . Sexual activity: Not Asked   Other Topics Concern  . None   Social History Narrative   Married, twin daughters born 14, son born 1988   Works at [employer] in [employer], mining.     Family History:  The patient's family history includes Alcohol abuse in her father; Cancer (age of onset: 40) in her father; Cancer (age of onset: 80) in her mother; Diabetes in her mother; Heart disease (age of onset: 48) in her brother; Hypertension in her father and mother; Stroke in her father.   ROS:   Please see the history of present illness.    She denies orthopnea. She does have a sensation of  fluid retention and puffiness. Has long-standing history of hypertension for which she takes hydro-Diuril. She snores loudly. She has excessive daytime sleepiness.  All other systems reviewed and are negative.   PHYSICAL EXAM:   VS:  BP 132/82 (BP Location: Left Arm)   Pulse 68   Ht 5\' 7"  (1.702 m)   Wt 216 lb 9.6 oz (98.2 kg)   BMI 33.92 kg/m    GEN: Well nourished, well developed, in no acute distress  HEENT: normal  Neck: no JVD, carotid bruits, or masses Cardiac: RRR; no murmurs, rubs, or gallops,no edema  Respiratory:  clear to auscultation bilaterally, normal work of breathing GI: soft, nontender, nondistended, + BS MS: no deformity or atrophy  Skin: warm and dry, no rash Neuro:  Alert and Oriented x 3, Strength and  sensation are intact Psych: euthymic mood, full affect  Wt Readings from Last 3 Encounters:  12/02/16 216 lb 9.6 oz (98.2 kg)  03/25/16 210 lb (95.3 kg)  02/05/16 206 lb (93.4 kg)      Studies/Labs Reviewed:   EKG:  EKG  Normal sinus rhythm with left anterior hemiblock and voltage criteria for LVH.  Recent Labs: 12/22/2015: Pro B Natriuretic peptide (BNP) 28.0 03/25/2016: ALT 21; BUN 13; Creatinine, Ser 0.90; Hemoglobin 14.8; Platelets 312.0; Potassium 3.1; Sodium 138; TSH 1.34   Lipid Panel    Component Value Date/Time   CHOL 198 03/25/2016 0836   TRIG 216.0 (H) 03/25/2016 0836   HDL 45.80 03/25/2016 0836   CHOLHDL 4 03/25/2016 0836   VLDL 43.2 (H) 03/25/2016 0836   LDLCALC 90 07/18/2015 0928   LDLDIRECT 116.0 03/25/2016 0836    Additional studies/ records that were reviewed today include:  No clinical data.    ASSESSMENT:    1. Dyspnea on exertion   2. Essential hypertension   3. Peripheral edema   4. Other hyperlipidemia   5. Snoring      PLAN:  In order of problems listed above:  1. Of uncertain etiology. Could be related to deconditioning. Plan stress Cardiolite to exclude myocardial ischemia. 2. The stress test will help [place] to determine if blood pressure is under adequate control. 2-D Doppler echocardiogram will be done to assess LV thickness given EKG appearance suggesting left ventricular hypertrophy. 3. No edema on exam. Neck veins are flat. 4. Continuous statin therapy for control of lipids. LDL target should be 100 unless redocument vascular disease. 5. May ultimately need a sleep study performed.    Medication Adjustments/Labs and Tests Ordered: Current medicines are reviewed at length with the patient today.  Concerns regarding medicines are outlined above.  Medication changes, Labs and Tests ordered today are listed in the Patient Instructions below. Patient Instructions  Medication Instructions:   None  Labwork: None  Testing/Procedures: Your physician has requested that you have an echocardiogram. Echocardiography is a painless test that uses sound waves to create images of your heart. It provides your doctor with information about the size and shape of your heart and how well your heart's chambers and valves are working. This procedure takes approximately one hour. There are no restrictions for this procedure.  Your physician has requested that you have en exercise stress myoview. For further information please visit [email or web address]. Please follow instruction sheet, as given.   Follow-Up: Your physician recommends that you schedule a follow-up appointment in: late January with Dr. Tamala Julian. (May use 10:45A END slot on 1/26 or 1/31)   Any Other Special Instructions Will Be Listed Below (If Applicable).  If you need a refill on your cardiac medications before your next appointment, please call your pharmacy.      Signed, Sinclair Grooms, MD  12/02/2016 4:45 PM    [facility] [place], [place], [place]  [address] Phone: [phone]; Fax: [phone]

## 2016-12-05 ENCOUNTER — Telehealth (HOSPITAL_COMMUNITY): Payer: Self-pay | Admitting: *Deleted

## 2016-12-05 NOTE — Telephone Encounter (Signed)
Patient given detailed instructions per Myocardial Perfusion Study Information Sheet for the test on 12/11/16. Patient notified to arrive 15 minutes early and that it is imperative to arrive on time for appointment to keep from having the test rescheduled.  If you need to cancel or reschedule your appointment, please call the office within 24 hours of your appointment. Failure to do so may result in a cancellation of your appointment, and a $50 no show fee. Patient verbalized understanding. Kirstie Peri

## 2016-12-06 ENCOUNTER — Other Ambulatory Visit (HOSPITAL_COMMUNITY): Payer: 59

## 2016-12-10 ENCOUNTER — Encounter (HOSPITAL_COMMUNITY): Payer: 59

## 2016-12-11 ENCOUNTER — Other Ambulatory Visit: Payer: Self-pay

## 2016-12-11 ENCOUNTER — Ambulatory Visit (HOSPITAL_BASED_OUTPATIENT_CLINIC_OR_DEPARTMENT_OTHER): Payer: 59

## 2016-12-11 ENCOUNTER — Ambulatory Visit (HOSPITAL_COMMUNITY): Payer: 59 | Attending: Internal Medicine

## 2016-12-11 DIAGNOSIS — R0609 Other forms of dyspnea: Secondary | ICD-10-CM | POA: Diagnosis not present

## 2016-12-11 DIAGNOSIS — R079 Chest pain, unspecified: Secondary | ICD-10-CM | POA: Insufficient documentation

## 2016-12-11 DIAGNOSIS — R0600 Dyspnea, unspecified: Secondary | ICD-10-CM

## 2016-12-11 DIAGNOSIS — E785 Hyperlipidemia, unspecified: Secondary | ICD-10-CM | POA: Insufficient documentation

## 2016-12-11 DIAGNOSIS — I10 Essential (primary) hypertension: Secondary | ICD-10-CM | POA: Diagnosis not present

## 2016-12-11 LAB — MYOCARDIAL PERFUSION IMAGING
Estimated workload: 10.4 METS
Exercise duration (min): 9 min
Exercise duration (sec): 0 s
LV dias vol: 83 mL (ref 46–106)
LV sys vol: 34 mL
MPHR: 165 {beats}/min
Peak HR: 9 {beats}/min
Percent HR: 92 %
RATE: 0.35
RPE: 18
Rest HR: 61 {beats}/min
SDS: 4
SRS: 1
SSS: 5
TID: 0.98

## 2016-12-11 LAB — ECHOCARDIOGRAM COMPLETE
Height: 67 in
Weight: 3456 oz

## 2016-12-11 MED ORDER — TECHNETIUM TC 99M TETROFOSMIN IV KIT
10.3000 | PACK | Freq: Once | INTRAVENOUS | Status: AC | PRN
  Administered 2016-12-11: 10.3 via INTRAVENOUS
  Filled 2016-12-11: qty 11

## 2016-12-11 MED ORDER — TECHNETIUM TC 99M TETROFOSMIN IV KIT
31.8000 | PACK | Freq: Once | INTRAVENOUS | Status: AC | PRN
  Administered 2016-12-11: 31.8 via INTRAVENOUS
  Filled 2016-12-11: qty 32

## 2016-12-13 ENCOUNTER — Telehealth: Payer: Self-pay | Admitting: *Deleted

## 2016-12-13 MED ORDER — HYDROCHLOROTHIAZIDE 25 MG PO TABS: 12.5000 mg | ORAL_TABLET | Freq: Every day | ORAL | 2 refills | Status: DC

## 2016-12-13 MED ORDER — AMLODIPINE BESYLATE 5 MG PO TABS: 5.0000 mg | ORAL_TABLET | Freq: Every day | ORAL | 3 refills | Status: DC

## 2016-12-13 MED FILL — AMLODIPINE BESYLATE 5 MG TA: 5 | 90 days supply | Qty: 90 | Fill #0

## 2016-12-13 NOTE — Telephone Encounter (Signed)
Reviewed stress test and echo with pt.  Went over new orders.  Scheduled pt to see HTN clinic 12/30/16 at 4pm.  Pt verbalized understanding and was in agreement with this plan.    Pt is scheduled to see Dr. Tamala Julian on 01/10/17 for follow up on testing.  Pt would like to know if she needs to keep this appt.  Advised I would check with Dr. Tamala Julian if she needed to keep appt.

## 2016-12-13 NOTE — Telephone Encounter (Signed)
-----   Message from Belva Crome, MD sent at 12/12/2016  5:03 PM EST ----- Let the patient know the stress test shows no evidence of blockage. Reassuring! However the BP gets high with activity. Plan to decrease the HCTZ to 12.5 mg daily and the next day, start Amlodipine 5 mg daily. Needs BP f/u in hypertension clinic 2 weeks. A copy will be sent to Arnette Norris, MD

## 2016-12-14 NOTE — Telephone Encounter (Signed)
Appointment with me can be dismissed if BP okay at [facility]. She will need a 9-12 month f/u.

## 2016-12-17 NOTE — Telephone Encounter (Signed)
Spoke with pt and advised her of recommendations per Dr. Tamala Julian.  Advised pt if everything ok at [facility]N Clinic appt she can go by check out and cancel appt with Dr. Tamala Julian.  Pt verbalized understanding and was in agreement with this plan.

## 2016-12-18 MED FILL — CITALOPRAM HBR 40 MG TABLET: 40 | 90 days supply | Qty: 90 | Fill #1

## 2016-12-24 ENCOUNTER — Telehealth: Payer: Self-pay | Admitting: Interventional Cardiology

## 2016-12-24 ENCOUNTER — Other Ambulatory Visit: Payer: Self-pay | Admitting: Family Medicine

## 2016-12-24 NOTE — Telephone Encounter (Signed)
Last f/u 03/2016 

## 2016-12-24 NOTE — Telephone Encounter (Signed)
Pt started Amlodipine 5mg  QD on 12/18/16.  Since starting med she has been fatigued and thought she was possibly getting a cold.  States she has swelling in hands and feet.  Feels like she has an "electric shock" or "zapping" running through her head, is flushed and just generally doesn't feel well today.  BP at work right now is 143/85.  Advised I would send information to Dr. Tamala Julian for review and advisement.  Pt prefers not to take Amlodipine anymore.

## 2016-12-24 NOTE — Telephone Encounter (Signed)
Patient states Amlodipine 5mg  was added to her current blood pressure medication HCTZ 12.5mg  (this was cut in half per patient at her last visit).   Her BP today is 143/85 and her head feels "shocky" and funny.  She states she has never felt like this before.

## 2016-12-25 MED FILL — ALPRAZolam 0.25 MG TABS: 0.25 | 30 days supply | Qty: 30 | Fill #0

## 2016-12-25 NOTE — Telephone Encounter (Signed)
If still symptomatic stop amlodipine.  Not sure it has anything to do with how she feels. Monitor BP.

## 2016-12-25 NOTE — Telephone Encounter (Signed)
Rx called in to requested pharmacy 

## 2016-12-25 NOTE — Telephone Encounter (Signed)
Left message to call back  

## 2016-12-26 ENCOUNTER — Encounter: Payer: Self-pay | Admitting: Interventional Cardiology

## 2016-12-26 NOTE — Telephone Encounter (Signed)
Follow Up ° ° ° °Returning call from earlier. Please call. °

## 2016-12-26 NOTE — Telephone Encounter (Signed)
Left message to call back  

## 2016-12-26 NOTE — Telephone Encounter (Signed)
Pt states after speaking to me the other day, she went home and checked her medications and realized she had missed a couple of doses of her Lexapro.  She has since started that back and has taken the Amlodipine as well with no issues.  Advised pt to continue Amlodipine and to monitor her BP.  Advised to call if BP too low or high or symptoms come back.  Pt verbalized understanding and was appreciative for assistance.

## 2016-12-26 NOTE — Telephone Encounter (Signed)
This encounter was created in error - please disregard.

## 2016-12-26 NOTE — Telephone Encounter (Signed)
New Message     Returning your call would like a call back .

## 2016-12-30 ENCOUNTER — Ambulatory Visit: Payer: 59

## 2016-12-31 ENCOUNTER — Ambulatory Visit (INDEPENDENT_AMBULATORY_CARE_PROVIDER_SITE_OTHER): Payer: 59 | Admitting: Pharmacist

## 2016-12-31 VITALS — BP 128/84 | HR 69 | Wt 214.2 lb

## 2016-12-31 DIAGNOSIS — I10 Essential (primary) hypertension: Secondary | ICD-10-CM

## 2016-12-31 NOTE — Patient Instructions (Signed)
Check your blood pressure at home daily (if able) and keep record of the readings.  Take your BP meds as follows: CONTINUE Hydrochlorothiazide 25mg  daily and amlodipine 5mg  daily   Goal blood pressure less than 130/80 - call [phone] if not consistently less in 2-4 weeks.   Bring all of your meds, your BP cuff and your record of home blood pressures to your next appointment.  Exercise as you're able, try to walk approximately 30 minutes per day.  Keep salt intake to a minimum, especially watch canned and prepared boxed foods.  Eat more fresh fruits and vegetables and fewer canned items.  Avoid eating in fast food restaurants.    HOW TO TAKE YOUR BLOOD PRESSURE: . Rest 5 minutes before taking your blood pressure. .  Don't smoke or drink caffeinated beverages for at least 30 minutes before. . Take your blood pressure before (not after) you eat. . Sit comfortably with your back supported and both feet on the floor (don't cross your legs). . Elevate your arm to heart level on a table or a desk. . Use the proper sized cuff. It should fit smoothly and snugly around your bare upper arm. There should be enough room to slip a fingertip under the cuff. The bottom edge of the cuff should be 1 inch above the crease of the elbow. . Ideally, take 3 measurements at one sitting and record the average.

## 2016-12-31 NOTE — Progress Notes (Signed)
Patient ID: BETTYLU KIERCE                 DOB: [birth year]                      MRN: [id number]     HPI: Regina Huber is a 56 y.o. female patient of Dr. Tamala Julian with PMH below who presents today for hypertension evaluation. At her last visit with Dr. Tamala Julian her pressure was borderline at goal at 132/82. Her most recent stress test showed no evidence of blockage, but that BP increased with activity. At this time her HCTZ was decreased to 12.5mg  daily and she was started on amlodipine.   She presents today and states that she is doing well on the amlodipine. She reports that she has been taking HCTZ 25mg  for the last 2 days as her pressure was not where she wanted after starting the amlodipine. She reports she has held fluid to an extent her whole life and there has been no change with starting amlodipine or smaller doses of HCTZ.   PMH: HTN, myasthenia gravis, HLD  Current HTN meds:  Hydrochlorothiazide 12.5mg  daily Amlodipine 5mg  daily  Previously tried: atenolol  BP goal: <130/80  Family History: Hypertension in bother of her parents. Her mother is 34 years old now but did have an MI about 20 years ago. Her father had an enlarged heart and had a stroke at about 56 yo. She has one brother with hypertension.   Social History: She denies tobacco products and alcohol.   Diet: She eats most of her meals from home. She is a vegetarian and her daughters are vegan. She rarely adds salt and seasonings to her food. She does endorse 2 cups of coffee per day. She states that her diet is low in sweets, but it relatively high in carbs since she is a vegetarian.   Exercise: She joined [employer] last weekend and walked on the treadmill for 1 hour with an incline. Her goal is to progressively increase to 3-4 times per week.   Home BP readings:  She reports she recently purchased a new cuff and her home measurements have been about 130s/mid80s.   Wt Readings from Last 3 Encounters:  12/31/16 214 lb 4  oz (97.2 kg)  12/11/16 216 lb (98 kg)  12/02/16 216 lb 9.6 oz (98.2 kg)   BP Readings from Last 3 Encounters:  12/31/16 128/84  12/02/16 132/82  03/25/16 120/72   Pulse Readings from Last 3 Encounters:  12/31/16 69  12/02/16 68  03/25/16 62    Renal function: CrCl cannot be calculated (Patient's most recent lab result is older than the maximum 21 days allowed.).  Past Medical History:  Diagnosis Date  . Depression   . Hyperlipidemia   . Hypertension   . Myasthenia gravis ([place])    dx 2005   s/p  thymectomy 2006--  per pt no other symtpoms since  . Palpitations   . Vaginal intraepithelial neoplasia II (VAIN II)     Current Outpatient Prescriptions on File Prior to Visit  Medication Sig Dispense Refill  . ALPRAZolam (XANAX) 0.25 MG tablet TAKE 1 TABLET BY MOUTH AT BEDTIME AS NEEDED 30 tablet 0  . amLODipine (NORVASC) 5 MG tablet Take 1 tablet (5 mg total) by mouth daily. 90 tablet 3  . citalopram (CELEXA) 40 MG tablet TAKE 1 TABLET BY MOUTH ONCE DAILY 90 tablet 1  . hydrochlorothiazide (HYDRODIURIL) 25 MG tablet Take 0.5  tablets (12.5 mg total) by mouth daily. (Patient taking differently: Take 25 mg by mouth daily. ) 90 tablet 2  . pravastatin (PRAVACHOL) 20 MG tablet TAKE 1 TABLET BY MOUTH ONCE DAILY 90 tablet 1  . zolpidem (AMBIEN) 10 MG tablet Take 10 mg by mouth at bedtime as needed for sleep.  5   No current facility-administered medications on file prior to visit.     Allergies  Allergen Reactions  . Penicillins Rash    - has tolerated cephalosporins    Blood pressure 128/84, pulse 69, weight 214 lb 4 oz (97.2 kg), SpO2 97 %.   Assessment/Plan: Hypertension: BP today is at goal in office. Will have patient remain on HCTZ 25mg  and amlodipine 5mg  daily. She prefers to call if her pressures trend above 130/80. Have asked she monitor daily for the next few weeks and call to make follow up in hypertension clinic or with Dr. Tamala Julian if several measurements above 130/80  as may need to titrate medications to control pressures.    Thank you, Lelan Pons. Patterson Hammers[place], [place]

## 2017-01-02 ENCOUNTER — Encounter: Payer: Self-pay | Admitting: Pharmacist

## 2017-01-06 ENCOUNTER — Encounter: Payer: Self-pay | Admitting: *Deleted

## 2017-01-06 ENCOUNTER — Emergency Department (INDEPENDENT_AMBULATORY_CARE_PROVIDER_SITE_OTHER)
Admission: EM | Admit: 2017-01-06 | Discharge: 2017-01-06 | Disposition: A | Discharge status: Home / Self Care | Payer: 59 | Source: Home / Self Care | Attending: Family Medicine | Admitting: Family Medicine

## 2017-01-06 DIAGNOSIS — J069 Acute upper respiratory infection, unspecified: Secondary | ICD-10-CM

## 2017-01-06 MED ORDER — AZITHROMYCIN 250 MG PO TABS: 250.0000 mg | ORAL_TABLET | Freq: Every day | ORAL | 0 refills | Status: DC

## 2017-01-06 MED ORDER — METHYLPREDNISOLONE ACETATE 40 MG/ML IJ SUSP
80.0000 mg | Freq: Once | INTRAMUSCULAR | Status: AC
  Administered 2017-01-06: 80 mg via INTRAMUSCULAR

## 2017-01-06 MED ORDER — IPRATROPIUM BROMIDE 0.06 % NA SOLN: 2.0000 | Freq: Four times a day (QID) | NASAL | 0 refills | Status: DC

## 2017-01-06 MED FILL — IPRATROPIUM 0.06% SPRAY: 0.06 | 13 days supply | Qty: 15 | Fill #0

## 2017-01-06 MED FILL — AZITHROMYCIN 250 MG TABLET: 250 | 5 days supply | Qty: 6 | Fill #0

## 2017-01-06 NOTE — ED Provider Notes (Signed)
CSN: [id number]     Arrival date & time 01/06/17  [id number] History   First MD Initiated Contact with Patient 01/06/17 0919     Chief Complaint  Patient presents with  . Cough  . Headache  . Sinus Problem   (Consider location/radiation/quality/duration/timing/severity/associated sxs/prior Treatment) HPI Regina Huber is a 56 y.o. female presenting to UC with c/o 9 days of worsening cough, congestion, sinus pain and pressure, frontal headache, chills and sweats.  Cough has become more productive with green sputum production over the last 2 days.  She has taken Advil, Mucinex, Sudafed, and Delsym with mild temporary relief.  Denies known fever. Denies body aches or nausea. 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 She notes she gets bronchitis around this time every year.  She typically does well with a steroid shot and antibiotics. No known hx of asthma but she has used an inhaler in the past when she gets sick. She does not feel she needs one yet.   Past Medical History:  Diagnosis Date  . Depression   . Hyperlipidemia   . Hypertension   . Myasthenia gravis ([place])    dx 2005   s/p  thymectomy 2006--  per pt no other symtpoms since  . Palpitations   . Vaginal intraepithelial neoplasia II (VAIN II)    Past Surgical History:  Procedure Laterality Date  . ABDOMINAL HYSTERECTOMY  05-07-2000   w/  Right Ovarian Cystectomy  . [place]  . CO2 LASER APPLICATION N/A [id number]   Procedure: CO2 LASER VAPORIZATION OF THE VAGINA;  Surgeon: Everitt Amber, MD;  Location: [place];  Service: Gynecology;  Laterality: N/A;  . COMBINED ABDOMINOPLASTY AND LIPOSUCTION  03-12-2002  . LAPAROTOMY/  SUTURE INTRAPERITONEAL BLEEDING  05-08-2000  . LUMBAR DISC SURGERY  11-07-2010   L5 - S1  . TRANSSTERNAL THYMECTOMY  04-29-2005   Family History  Problem Relation Age of Onset  . Cancer Mother 39    breast CA  .  Hypertension Mother   . Diabetes Mother   . Cancer Father 105    colon CA  . Alcohol abuse Father   . Hypertension Father   . Stroke Father   . Heart disease Brother 29    MI   Social History  Substance Use Topics  . Smoking status: Never Smoker  . Smokeless tobacco: Never Used  . Alcohol use No   OB History    No data available     Review of Systems  Constitutional: Positive for chills and fatigue. Negative for fever.  HENT: Positive for congestion, postnasal drip, rhinorrhea, sinus pain, sinus pressure and sore throat. Negative for ear pain, trouble swallowing and voice change.   Respiratory: Positive for cough. Negative for shortness of breath.   Cardiovascular: Negative for chest pain and palpitations.  Gastrointestinal: Negative for abdominal pain, diarrhea, nausea and vomiting.  Musculoskeletal: Negative for arthralgias, back pain and myalgias.  Skin: Negative for rash.  Neurological: Positive for headaches ( frontal). Negative for dizziness and light-headedness.    Allergies  Penicillins  Home Medications   Prior to Admission medications   Medication Sig Start Date End Date Taking? Authorizing Provider  ALPRAZolam Duanne Moron) 0.25 MG tablet TAKE 1 TABLET BY MOUTH AT BEDTIME AS NEEDED 12/24/16   Lucille Passy, MD  amLODipine (NORVASC) 5 MG tablet Take 1 tablet (5 mg total) by  mouth daily. 12/13/16 03/13/17  Belva Crome, MD  azithromycin (ZITHROMAX) 250 MG tablet Take 1 tablet (250 mg total) by mouth daily. Take first 2 tablets together, then 1 every day until finished. 01/06/17   Noland Fordyce, PA-C  citalopram (CELEXA) 40 MG tablet TAKE 1 TABLET BY MOUTH ONCE DAILY 09/06/16   Lucille Passy, MD  hydrochlorothiazide (HYDRODIURIL) 25 MG tablet Take 0.5 tablets (12.5 mg total) by mouth daily. Patient taking differently: Take 25 mg by mouth daily.  12/13/16   Belva Crome, MD  ipratropium (ATROVENT) 0.06 % nasal spray Place 2 sprays into both nostrils 4 (four) times daily. For 1  week 01/06/17   Noland Fordyce, PA-C  pravastatin (PRAVACHOL) 20 MG tablet TAKE 1 TABLET BY MOUTH ONCE DAILY 10/18/16   Lucille Passy, MD  zolpidem (AMBIEN) 10 MG tablet Take 10 mg by mouth at bedtime as needed for sleep. 11/12/16   Historical Provider, MD   Meds Ordered and Administered this Visit   Medications  methylPREDNISolone acetate (DEPO-MEDROL) injection 80 mg (80 mg Intramuscular Given 01/06/17 0930)    BP 142/84 (BP Location: Left Arm)   Pulse 73   Temp 98.1 F (36.7 C) (Oral)   Resp 16   Wt 213 lb (96.6 kg)   SpO2 97%   BMI 33.36 kg/m  No data found.   Physical Exam  Constitutional: She is oriented to person, place, and time. She appears well-developed and well-nourished. No distress.  HENT:  Head: Normocephalic and atraumatic.  Right Ear: Tympanic membrane normal.  Left Ear: Tympanic membrane normal.  Nose: Mucosal edema present. Right sinus exhibits maxillary sinus tenderness and frontal sinus tenderness. Left sinus exhibits maxillary sinus tenderness and frontal sinus tenderness.  Mouth/Throat: Uvula is midline, oropharynx is clear and moist and mucous membranes are normal.  Eyes: EOM are normal.  Neck: Normal range of motion. Neck supple.  Cardiovascular: Normal rate and regular rhythm.   Pulmonary/Chest: Effort normal. No stridor. No respiratory distress. She has decreased breath sounds in the right lower field and the left lower field. She has no wheezes. She has no rales.  Musculoskeletal: Normal range of motion.  Lymphadenopathy:    She has no cervical adenopathy.  Neurological: She is alert and oriented to person, place, and time.  Skin: Skin is warm and dry. She is not diaphoretic.  Psychiatric: She has a normal mood and affect. Her behavior is normal.  Nursing note and vitals reviewed.   Urgent Care Course     Procedures (including critical care time)  Labs Review Labs Reviewed - No data to display  Imaging Review No results found.   MDM   1.  Upper respiratory tract infection, unspecified type    Pt c/o worsening URI and sinus symptoms but notes she typically does well with a steroid shot and z-pak.  Depomedrol 80mg  IM given in UC.  Rx: Azithromycin and ipratropium nasal spray  Encouraged fluids and rest. F/u with PCP in 7-10 days if not improving. Patient verbalized understanding and agreement with treatment plan.     Noland Fordyce, PA-C 01/06/17 [id number]

## 2017-01-06 NOTE — Discharge Instructions (Signed)

## 2017-01-06 NOTE — ED Triage Notes (Signed)
Patient c/o 9 days of cough, nasal congestion, sinus pain, HA, chills/sweats. Cough is now productive. Taken Advil, Mucinex and Sudafed otc.

## 2017-01-10 ENCOUNTER — Ambulatory Visit: Payer: 59 | Admitting: Interventional Cardiology

## 2017-01-28 MED FILL — PRAVASTATIN SODIUM 20 MG TA: 20 | 90 days supply | Qty: 90 | Fill #1

## 2017-01-28 MED FILL — HYDROCHLOROTHIAZIDE 25 MG T: 25 | 90 days supply | Qty: 90 | Fill #2

## 2017-02-04 ENCOUNTER — Other Ambulatory Visit: Payer: Self-pay | Admitting: *Deleted

## 2017-02-04 NOTE — Telephone Encounter (Signed)
Which rx is she requesting?

## 2017-02-04 NOTE — Telephone Encounter (Signed)
Last f/u 03/2016 

## 2017-02-05 MED ORDER — ALPRAZOLAM 0.25 MG PO TABS: 0.2500 mg | ORAL_TABLET | Freq: Every evening | ORAL | 0 refills | Status: DC | PRN

## 2017-02-05 NOTE — Telephone Encounter (Signed)
Called in to [facility] - [place], [place] - [address]Phone: [phone]  

## 2017-02-05 NOTE — Telephone Encounter (Signed)
Per patient Regina Huber 0.25MG 

## 2017-02-05 NOTE — Telephone Encounter (Signed)
Ok to phone in rx as entered below. 

## 2017-02-06 MED FILL — ALPRAZolam 0.25 MG TABS: 0.25 | 30 days supply | Qty: 30 | Fill #0

## 2017-03-05 ENCOUNTER — Other Ambulatory Visit: Payer: Self-pay | Admitting: Family Medicine

## 2017-03-05 MED ORDER — ALPRAZOLAM 0.25 MG PO TABS: 0.2500 mg | ORAL_TABLET | Freq: Every evening | ORAL | 0 refills | Status: DC | PRN

## 2017-03-05 MED FILL — ZOLPIDEM TARTRATE 10 MG TAB: 10 | 30 days supply | Qty: 30 | Fill #1

## 2017-03-05 NOTE — Telephone Encounter (Signed)
Last office visit 03/25/2016.  Last refilled 02/05/2017 for #30 with no refills.  Ok to refill?

## 2017-03-05 NOTE — Telephone Encounter (Signed)
Alprazolam called into [place], [place] - [address] [facility]. Phone: [phone]

## 2017-03-06 MED FILL — ALPRAZolam 0.25 MG TABS: 0.25 | 30 days supply | Qty: 30 | Fill #0

## 2017-03-10 ENCOUNTER — Other Ambulatory Visit: Payer: Self-pay | Admitting: Family Medicine

## 2017-03-10 MED FILL — CITALOPRAM HBR 40 MG TABLET: 40 | 90 days supply | Qty: 90 | Fill #0

## 2017-03-10 MED FILL — AMLODIPINE BESYLATE 5 MG TA: 5 | 90 days supply | Qty: 90 | Fill #1

## 2017-03-10 NOTE — Telephone Encounter (Signed)
Refilled medication

## 2017-04-28 ENCOUNTER — Other Ambulatory Visit: Payer: Self-pay | Admitting: Family Medicine

## 2017-04-28 ENCOUNTER — Encounter: Payer: Self-pay | Admitting: Family Medicine

## 2017-04-28 DIAGNOSIS — Z01419 Encounter for gynecological examination (general) (routine) without abnormal findings: Secondary | ICD-10-CM

## 2017-04-28 DIAGNOSIS — E7849 Other hyperlipidemia: Secondary | ICD-10-CM

## 2017-04-28 DIAGNOSIS — I10 Essential (primary) hypertension: Secondary | ICD-10-CM

## 2017-04-28 MED ORDER — ALPRAZOLAM 0.25 MG PO TABS: 0.2500 mg | ORAL_TABLET | Freq: Every evening | ORAL | 0 refills | Status: DC | PRN

## 2017-04-28 MED FILL — ALPRAZolam 0.25 MG TABS: 0.25 | 30 days supply | Qty: 30 | Fill #0

## 2017-04-28 NOTE — Telephone Encounter (Signed)
Last refill 03/05/17, last OV 03/25/16

## 2017-04-28 NOTE — Telephone Encounter (Signed)
Called in to [facility], [address]Phone: [phone]

## 2017-04-29 ENCOUNTER — Other Ambulatory Visit: Payer: 59

## 2017-04-30 MED FILL — ZOLPIDEM TARTRATE 10 MG TAB: 10 | 30 days supply | Qty: 30 | Fill #2

## 2017-05-01 ENCOUNTER — Other Ambulatory Visit (INDEPENDENT_AMBULATORY_CARE_PROVIDER_SITE_OTHER): Payer: 59

## 2017-05-01 DIAGNOSIS — E7849 Other hyperlipidemia: Secondary | ICD-10-CM

## 2017-05-01 DIAGNOSIS — Z01419 Encounter for gynecological examination (general) (routine) without abnormal findings: Secondary | ICD-10-CM

## 2017-05-01 DIAGNOSIS — E784 Other hyperlipidemia: Secondary | ICD-10-CM | POA: Diagnosis not present

## 2017-05-01 LAB — CBC WITH DIFFERENTIAL/PLATELET
Basophils Absolute: 0.1 10*3/uL (ref 0.0–0.1)
Basophils Relative: 0.7 % (ref 0.0–3.0)
Eosinophils Absolute: 0.2 10*3/uL (ref 0.0–0.7)
Eosinophils Relative: 2.7 % (ref 0.0–5.0)
HCT: 42.7 % (ref 36.0–46.0)
Hemoglobin: 14.6 g/dL (ref 12.0–15.0)
Lymphocytes Relative: 26.6 % (ref 12.0–46.0)
Lymphs Abs: 2.2 10*3/uL (ref 0.7–4.0)
MCHC: 34.3 g/dL (ref 30.0–36.0)
MCV: 83.4 fl (ref 78.0–100.0)
Monocytes Absolute: 0.4 10*3/uL (ref 0.1–1.0)
Monocytes Relative: 5.4 % (ref 3.0–12.0)
Neutro Abs: 5.4 10*3/uL (ref 1.4–7.7)
Neutrophils Relative %: 64.6 % (ref 43.0–77.0)
Platelets: 336 10*3/uL (ref 150.0–400.0)
RBC: 5.11 Mil/uL (ref 3.87–5.11)
RDW: 13.8 % (ref 11.5–15.5)
WBC: 8.4 10*3/uL (ref 4.0–10.5)

## 2017-05-01 LAB — COMPREHENSIVE METABOLIC PANEL
ALT: 20 U/L (ref 0–35)
AST: 17 U/L (ref 0–37)
Albumin: 4.4 g/dL (ref 3.5–5.2)
Alkaline Phosphatase: 93 U/L (ref 39–117)
BUN: 10 mg/dL (ref 6–23)
CO2: 32 mEq/L (ref 19–32)
Calcium: 9.9 mg/dL (ref 8.4–10.5)
Chloride: 99 mEq/L (ref 96–112)
Creatinine, Ser: 0.87 mg/dL (ref 0.40–1.20)
GFR: 71.65 mL/min (ref >60.00)
Glucose, Bld: 114 mg/dL — ABNORMAL HIGH (ref 70–99)
Potassium: 3.5 mEq/L (ref 3.5–5.1)
Sodium: 139 mEq/L (ref 135–145)
Total Bilirubin: 0.5 mg/dL (ref 0.2–1.2)
Total Protein: 7.7 g/dL (ref 6.0–8.3)

## 2017-05-01 LAB — LIPID PANEL
Cholesterol: 194 mg/dL (ref 0–200)
HDL: 47.2 mg/dL (ref >39.00)
LDL Cholesterol: 110 mg/dL — ABNORMAL HIGH (ref 0–99)
NonHDL: 146.74
Total CHOL/HDL Ratio: 4
Triglycerides: 183 mg/dL — ABNORMAL HIGH (ref 0.0–149.0)
VLDL: 36.6 mg/dL (ref 0.0–40.0)

## 2017-05-01 LAB — TSH: TSH: 0.94 u[IU]/mL (ref 0.35–4.50)

## 2017-05-05 ENCOUNTER — Other Ambulatory Visit: Payer: Self-pay | Admitting: Family Medicine

## 2017-05-13 ENCOUNTER — Other Ambulatory Visit: Payer: Self-pay | Admitting: Family Medicine

## 2017-05-13 MED FILL — PRAVASTATIN SODIUM 20 MG TA: 20 | 90 days supply | Qty: 90 | Fill #0

## 2017-05-14 MED FILL — HYDROCHLOROTHIAZIDE 25 MG T: 25 | 90 days supply | Qty: 45 | Fill #0

## 2017-05-22 DIAGNOSIS — N890 Mild vaginal dysplasia: Secondary | ICD-10-CM | POA: Diagnosis not present

## 2017-05-22 DIAGNOSIS — Z01419 Encounter for gynecological examination (general) (routine) without abnormal findings: Secondary | ICD-10-CM | POA: Diagnosis not present

## 2017-06-03 ENCOUNTER — Encounter: Payer: Self-pay | Admitting: Gynecologic Oncology

## 2017-06-03 ENCOUNTER — Telehealth: Payer: Self-pay | Admitting: *Deleted

## 2017-06-03 NOTE — Telephone Encounter (Signed)
Patient returned call and appt made for tomorrow afternoon.

## 2017-06-03 NOTE — Telephone Encounter (Signed)
Attempted to contact the patient to schedule an appt for follow up per fax from Dr. Julien Girt office. Left the patient a message to call the office back for an appt.

## 2017-06-04 ENCOUNTER — Ambulatory Visit: Payer: 59 | Attending: Gynecologic Oncology | Admitting: Gynecologic Oncology

## 2017-06-04 ENCOUNTER — Encounter: Payer: Self-pay | Admitting: Gynecologic Oncology

## 2017-06-04 VITALS — BP 133/77 | HR 62 | Temp 98.1°F | Resp 20 | Wt 202.9 lb

## 2017-06-04 DIAGNOSIS — N890 Mild vaginal dysplasia: Secondary | ICD-10-CM | POA: Diagnosis not present

## 2017-06-04 DIAGNOSIS — N891 Moderate vaginal dysplasia: Secondary | ICD-10-CM | POA: Insufficient documentation

## 2017-06-04 DIAGNOSIS — D072 Carcinoma in situ of vagina: Secondary | ICD-10-CM | POA: Diagnosis not present

## 2017-06-04 NOTE — Progress Notes (Signed)
POSTOPERATIVE FOLLOW-UP  HPI:  Regina Huber is a 56 y.o. year old No obstetric history on file. initially seen in consultation on 10/20/15 referred by Dr Julien Girt for [place].  She then underwent a CO2 laser of the upper vagina  on 9/41/74 without complications.  Her postoperative course was uncomplicated.  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   Review of systems: Constitutional:  She has no weight gain or weight loss. She has no fever or chills. Eyes: No blurred vision Ears, Nose, Mouth, Throat: No dizziness, headaches or changes in hearing. No mouth sores. Cardiovascular: No chest pain, palpitations or edema. Respiratory:  No shortness of breath, wheezing or cough Gastrointestinal: She has normal bowel movements without diarrhea or constipation. She denies any nausea or vomiting. She denies blood in her stool or heart burn. Genitourinary:  She denies pelvic pain, pelvic pressure or changes in her urinary function. She has no hematuria, dysuria, or incontinence. She has no irregular vaginal bleeding or vaginal discharge Musculoskeletal: Denies muscle weakness or joint pains.  Skin:  She has no skin changes, rashes or itching Neurological:  Denies dizziness or headaches. No neuropathy, no numbness or tingling. Psychiatric:  She denies depression or anxiety. Hematologic/Lymphatic:   No easy bruising or bleeding   Physical Exam: Blood pressure 133/77, pulse 62, temperature 98.1 F (36.7 C), resp. rate 20, weight 202 lb 14.4 oz (92 kg), SpO2 99 %. General: Well dressed, well nourished in no apparent distress.   Genitourinary: Normal EGBUS  Vagina: see below Extremities: No cyanosis, clubbing or edema.  No calf tenderness or erythema. No palpable cords. Psychiatric: Mood and affect are appropriate. Neurological: Awake, alert and oriented x 3. Sensation is intact, no neuropathy.  Musculoskeletal: No pain, normal strength and range  of motion.  PROCEDURE NOTE: Vaginal biopsy with colposcopy. preop dx: VAIN 3 pap Postop dx: same Verbal consent obtained Time out performed Speculum placed.  Vagina swabbed with acetic acid. Colposcopy performed of entire vagina. Left apex puckered with cuff closure (not tumor). Acetowhite changes broadly to posterior upper vagina and left apex. Subtle. No abnormal vasculature or apparent invasive malignancy. Biopsy taken from a rugation in upper left apex with forcep. Hemostasis achieved with silver nitrate. EBL: minimal Complications: none Specimen: left vaginal apex.   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   S/p CO2 laser on 01/04/16.   Plan: Plan for vaginal laser (repeat). Also discused vaginectomy and effudex medical therapy. I feel that laser might be the first best option to preserve vaginal length. Discussed surgical plan and anticipated healing. Patient electing for procedure in August due to work commitments. I think this is reasonable given the colposcopic appearance of the vagina and my low suspicion for occult malignancy.  Donaciano Eva, MD

## 2017-06-04 NOTE — Patient Instructions (Signed)
Plan to have vaginal laser on August 07, 2017 at the [facility].  You will receive a phone call from the [place] RN to discuss instructions.  Please call for any concerns.

## 2017-06-11 ENCOUNTER — Other Ambulatory Visit: Payer: Self-pay | Admitting: Family Medicine

## 2017-06-12 MED ORDER — ALPRAZOLAM 0.25 MG PO TABS: 0.2500 mg | ORAL_TABLET | Freq: Every evening | ORAL | 0 refills | Status: DC | PRN

## 2017-06-12 MED FILL — ALPRAZolam 0.25 MG TABS: 0.25 | 30 days supply | Qty: 30 | Fill #0

## 2017-06-12 NOTE — Telephone Encounter (Signed)
Ok to refill one time only.  Needs OV for further refills. 

## 2017-06-12 NOTE — Telephone Encounter (Signed)
Faxed to  [facility] - [place] - [address]  [address] [place] [address]  Phone: [phone] Fax: [phone]    

## 2017-06-12 NOTE — Telephone Encounter (Signed)
Last refill 04/28/17  Last OV 03/25/16 Ok to refill?

## 2017-06-16 ENCOUNTER — Encounter: Payer: Self-pay | Admitting: Family Medicine

## 2017-06-16 MED FILL — AMLODIPINE BESYLATE 5 MG TA: 5 | 90 days supply | Qty: 90 | Fill #2

## 2017-07-02 MED FILL — CITALOPRAM HBR 40 MG TABLET: 40 | 90 days supply | Qty: 90 | Fill #1

## 2017-07-04 ENCOUNTER — Telehealth: Payer: Self-pay | Admitting: Interventional Cardiology

## 2017-07-04 MED ORDER — HYDROCHLOROTHIAZIDE 25 MG PO TABS: 25.0000 mg | ORAL_TABLET | Freq: Every day | ORAL | 1 refills | Status: DC

## 2017-07-04 NOTE — Telephone Encounter (Signed)
[facility] requesting a new Rx for hydrochlorothiazide 25 mg tablet, stating pt is taking 1 tablet daily. LOV with Dr. Tamala Julian, pt was supposed to take hydrochlorothiazide 25 mg tablet, taken 1/2 tablet daily. Called pt and pt stated that she is almost out of medication and need Dr. Tamala Julian to sent in a new Rx for Hydrochlorothiazide 25 mg tablet, taken 1 tablet daily. Please advise

## 2017-07-04 NOTE — Telephone Encounter (Signed)
Pt was seen by Eino Farber, [facility] 12/2016 and told pt to continue HCTZ 25mg  QD as she had been taking this dose on her own for a few days.  Sent in new prescription to pharmacy.

## 2017-07-08 MED FILL — HYDROCHLOROTHIAZIDE 25 MG T: 25 | 90 days supply | Qty: 90 | Fill #0

## 2017-07-16 ENCOUNTER — Encounter (HOSPITAL_BASED_OUTPATIENT_CLINIC_OR_DEPARTMENT_OTHER): Payer: Self-pay | Admitting: *Deleted

## 2017-07-17 ENCOUNTER — Encounter (HOSPITAL_BASED_OUTPATIENT_CLINIC_OR_DEPARTMENT_OTHER): Payer: Self-pay | Admitting: *Deleted

## 2017-07-17 NOTE — Progress Notes (Addendum)
NPO AFTER MN.  ARRIVE AT 0930.  NEEDS ISTAT 8.   CURRENT EKG IN CHART AND EPIC.

## 2017-07-20 ENCOUNTER — Encounter: Payer: Self-pay | Admitting: Gynecologic Oncology

## 2017-07-28 NOTE — Progress Notes (Signed)
To [facility] at 1145-Istat on arrival-Possible date change-conflict with time on rescheduled date.Pt will contact office.Npo after Mn-clear liquids only until 0715.

## 2017-08-04 ENCOUNTER — Encounter: Payer: Self-pay | Admitting: Gynecologic Oncology

## 2017-08-06 ENCOUNTER — Telehealth: Payer: Self-pay | Admitting: *Deleted

## 2017-08-06 NOTE — Telephone Encounter (Signed)
Contacted the patient and informed her that we have recieved her message regarding her surgery. Informed the patient that [facility] APP will call her tomorrow regarding rescheduling her surgery.

## 2017-08-07 ENCOUNTER — Ambulatory Visit (HOSPITAL_BASED_OUTPATIENT_CLINIC_OR_DEPARTMENT_OTHER): Admission: RE | Admit: 2017-08-07 | Payer: 59 | Source: Ambulatory Visit | Admitting: Gynecologic Oncology

## 2017-08-07 HISTORY — DX: Carcinoma in situ of vagina: D07.2

## 2017-08-07 HISTORY — DX: Personal history of vaginal dysplasia: Z87.411

## 2017-08-07 SURGERY — CO2 LASER APPLICATION
Anesthesia: General

## 2017-08-07 MED FILL — PRAVASTATIN SODIUM 20 MG TA: 20 | 90 days supply | Qty: 90 | Fill #1

## 2017-08-11 ENCOUNTER — Other Ambulatory Visit: Payer: Self-pay | Admitting: Family Medicine

## 2017-08-11 ENCOUNTER — Encounter: Payer: Self-pay | Admitting: Gynecologic Oncology

## 2017-08-11 ENCOUNTER — Other Ambulatory Visit: Payer: Self-pay | Admitting: *Deleted

## 2017-08-11 MED FILL — ALPRAZolam 0.25 MG TABS: 0.25 | 30 days supply | Qty: 30 | Fill #0

## 2017-09-03 DIAGNOSIS — H524 Presbyopia: Secondary | ICD-10-CM | POA: Diagnosis not present

## 2017-09-08 ENCOUNTER — Encounter (HOSPITAL_BASED_OUTPATIENT_CLINIC_OR_DEPARTMENT_OTHER): Payer: Self-pay | Admitting: *Deleted

## 2017-09-08 NOTE — Progress Notes (Signed)
Pt instructed npo pmn 9/26.  To [facility] 9/27 @ 1030.  Needs istat on arrival.  ekg in epic

## 2017-09-11 ENCOUNTER — Ambulatory Visit (HOSPITAL_BASED_OUTPATIENT_CLINIC_OR_DEPARTMENT_OTHER): Payer: 59 | Admitting: Anesthesiology

## 2017-09-11 ENCOUNTER — Encounter (HOSPITAL_BASED_OUTPATIENT_CLINIC_OR_DEPARTMENT_OTHER): Payer: Self-pay | Admitting: *Deleted

## 2017-09-11 ENCOUNTER — Ambulatory Visit (HOSPITAL_BASED_OUTPATIENT_CLINIC_OR_DEPARTMENT_OTHER)
Admission: RE | Admit: 2017-09-11 | Discharge: 2017-09-11 | Disposition: A | Discharge status: Home / Self Care | Payer: 59 | Source: Ambulatory Visit | Attending: Gynecologic Oncology | Admitting: Gynecologic Oncology

## 2017-09-11 ENCOUNTER — Encounter (HOSPITAL_BASED_OUTPATIENT_CLINIC_OR_DEPARTMENT_OTHER)
Admission: RE | Disposition: A | Discharge status: Home / Self Care | Payer: Self-pay | Source: Ambulatory Visit | Attending: Gynecologic Oncology

## 2017-09-11 DIAGNOSIS — D072 Carcinoma in situ of vagina: Secondary | ICD-10-CM | POA: Insufficient documentation

## 2017-09-11 DIAGNOSIS — Z79899 Other long term (current) drug therapy: Secondary | ICD-10-CM | POA: Diagnosis not present

## 2017-09-11 DIAGNOSIS — N891 Moderate vaginal dysplasia: Secondary | ICD-10-CM | POA: Diagnosis present

## 2017-09-11 DIAGNOSIS — N893 Dysplasia of vagina, unspecified: Secondary | ICD-10-CM | POA: Diagnosis not present

## 2017-09-11 HISTORY — DX: Dyspnea, unspecified: R06.00

## 2017-09-11 HISTORY — DX: Torticollis: M43.6

## 2017-09-11 HISTORY — PX: CO2 LASER APPLICATION: SHX5778

## 2017-09-11 HISTORY — DX: Personal history of other medical treatment: Z92.89

## 2017-09-11 LAB — POCT I-STAT, CHEM 8
BUN: 10 mg/dL (ref 6–20)
Calcium, Ion: 1.12 mmol/L — ABNORMAL LOW (ref 1.15–1.40)
Chloride: 100 mmol/L — ABNORMAL LOW (ref 101–111)
Creatinine, Ser: 0.6 mg/dL (ref 0.44–1.00)
Glucose, Bld: 97 mg/dL (ref 65–99)
HCT: 39 % (ref 36.0–46.0)
Hemoglobin: 13.3 g/dL (ref 12.0–15.0)
Potassium: 4.7 mmol/L (ref 3.5–5.1)
Sodium: 139 mmol/L (ref 135–145)
TCO2: 29 mmol/L (ref 22–32)

## 2017-09-11 SURGERY — CO2 LASER APPLICATION
Anesthesia: General

## 2017-09-11 MED ORDER — LACTATED RINGERS IV SOLN
INTRAVENOUS | Status: DC
  Filled 2017-09-11: qty 1000

## 2017-09-11 MED ORDER — DEXAMETHASONE SODIUM PHOSPHATE 10 MG/ML IJ SOLN
INTRAMUSCULAR | Status: DC | PRN
  Administered 2017-09-11: 10 mg via INTRAVENOUS

## 2017-09-11 MED ORDER — LIDOCAINE 2% (20 MG/ML) 5 ML SYRINGE
INTRAMUSCULAR | Status: AC
Start: 2017-09-11 — End: ?
  Filled 2017-09-11: qty 5

## 2017-09-11 MED ORDER — MEPERIDINE HCL 25 MG/ML IJ SOLN
6.2500 mg | INTRAMUSCULAR | Status: DC | PRN
  Filled 2017-09-11: qty 1

## 2017-09-11 MED ORDER — PROPOFOL 10 MG/ML IV BOLUS
INTRAVENOUS | Status: AC
  Filled 2017-09-11: qty 20

## 2017-09-11 MED ORDER — ESTROGENS, CONJUGATED 0.625 MG/GM VA CREA: 1.0000 | TOPICAL_CREAM | Freq: Every day | VAGINAL | 0 refills | Status: DC

## 2017-09-11 MED ORDER — KETOROLAC TROMETHAMINE 30 MG/ML IJ SOLN
INTRAMUSCULAR | Status: DC | PRN
  Administered 2017-09-11: 30 mg via INTRAVENOUS

## 2017-09-11 MED ORDER — FENTANYL CITRATE (PF) 100 MCG/2ML IJ SOLN
25.0000 ug | INTRAMUSCULAR | Status: DC | PRN
Start: 2017-09-11 — End: 2017-09-11
  Filled 2017-09-11: qty 1

## 2017-09-11 MED ORDER — LIDOCAINE 2% (20 MG/ML) 5 ML SYRINGE
INTRAMUSCULAR | Status: DC | PRN
  Administered 2017-09-11: 100 mg via INTRAVENOUS

## 2017-09-11 MED ORDER — DEXAMETHASONE SODIUM PHOSPHATE 10 MG/ML IJ SOLN
INTRAMUSCULAR | Status: AC
  Filled 2017-09-11: qty 1

## 2017-09-11 MED ORDER — METOCLOPRAMIDE HCL 5 MG/ML IJ SOLN
10.0000 mg | Freq: Once | INTRAMUSCULAR | Status: DC | PRN
  Filled 2017-09-11: qty 2

## 2017-09-11 MED ORDER — ONDANSETRON HCL 4 MG/2ML IJ SOLN
INTRAMUSCULAR | Status: AC
  Filled 2017-09-11: qty 2

## 2017-09-11 MED ORDER — KETOROLAC TROMETHAMINE 30 MG/ML IJ SOLN
INTRAMUSCULAR | Status: AC
  Filled 2017-09-11: qty 1

## 2017-09-11 MED ORDER — SCOPOLAMINE 1 MG/3DAYS TD PT72
MEDICATED_PATCH | TRANSDERMAL | Status: AC
Start: 2017-09-11 — End: ?
  Filled 2017-09-11: qty 1

## 2017-09-11 MED ORDER — ONDANSETRON HCL 4 MG/2ML IJ SOLN
INTRAMUSCULAR | Status: DC | PRN
  Administered 2017-09-11: 4 mg via INTRAVENOUS

## 2017-09-11 MED ORDER — PROPOFOL 10 MG/ML IV BOLUS
INTRAVENOUS | Status: DC | PRN
  Administered 2017-09-11: 200 mg via INTRAVENOUS

## 2017-09-11 MED ORDER — ACETIC ACID 5 % SOLN
Status: DC | PRN
  Administered 2017-09-11: 1 via TOPICAL

## 2017-09-11 MED ORDER — FENTANYL CITRATE (PF) 100 MCG/2ML IJ SOLN
INTRAMUSCULAR | Status: DC | PRN
  Administered 2017-09-11: 100 ug via INTRAVENOUS

## 2017-09-11 MED ORDER — LACTATED RINGERS IV SOLN
INTRAVENOUS | Status: DC
  Administered 2017-09-11 (×2): via INTRAVENOUS
  Filled 2017-09-11: qty 1000

## 2017-09-11 MED ORDER — FENTANYL CITRATE (PF) 100 MCG/2ML IJ SOLN
INTRAMUSCULAR | Status: AC
  Filled 2017-09-11: qty 2

## 2017-09-11 MED ORDER — SCOPOLAMINE 1 MG/3DAYS TD PT72
MEDICATED_PATCH | TRANSDERMAL | Status: DC | PRN
  Administered 2017-09-11: 1 via TRANSDERMAL

## 2017-09-11 MED ORDER — MIDAZOLAM HCL 2 MG/2ML IJ SOLN
INTRAMUSCULAR | Status: DC | PRN
  Administered 2017-09-11: 2 mg via INTRAVENOUS

## 2017-09-11 MED ORDER — MIDAZOLAM HCL 2 MG/2ML IJ SOLN
INTRAMUSCULAR | Status: AC
  Filled 2017-09-11: qty 2

## 2017-09-11 MED ORDER — IBUPROFEN 600 MG PO TABS: 600.0000 mg | ORAL_TABLET | Freq: Four times a day (QID) | ORAL | 0 refills | Status: DC | PRN

## 2017-09-11 MED FILL — PREMARIN VAGINAL CREAM-APPL: 0.625 | 15 days supply | Qty: 30 | Fill #0

## 2017-09-11 SURGICAL SUPPLY — 36 items
APPLICATOR COTTON TIP 6IN STRL (MISCELLANEOUS) IMPLANT
BAG URINE DRAINAGE (UROLOGICAL SUPPLIES) IMPLANT
BLADE SURG 15 STRL LF DISP TIS (BLADE) IMPLANT
BLADE SURG 15 STRL SS (BLADE)
CANISTER SUCT 3000ML PPV (MISCELLANEOUS) IMPLANT
CANISTER SUCTION 1200CC (MISCELLANEOUS) ×1 IMPLANT
CATH ROBINSON RED A/P 16FR (CATHETERS) ×2 IMPLANT
DEPRESSOR TONGUE BLADE STERILE (MISCELLANEOUS) ×2 IMPLANT
DRSG TELFA 3X8 NADH (GAUZE/BANDAGES/DRESSINGS) IMPLANT
ELECT BALL LEEP 3MM BLK (ELECTRODE) IMPLANT
GLOVE BIO SURGEON STRL SZ 6 (GLOVE) ×2 IMPLANT
GOWN STRL REUS W/ TWL LRG LVL3 (GOWN DISPOSABLE) ×2 IMPLANT
GOWN STRL REUS W/TWL LRG LVL3 (GOWN DISPOSABLE) ×4
KIT RM TURNOVER CYSTO AR (KITS) ×2 IMPLANT
NDL HYPO 25X1 1.5 SAFETY (NEEDLE) IMPLANT
NEEDLE HYPO 25X1 1.5 SAFETY (NEEDLE) IMPLANT
NS IRRIG 500ML POUR BTL (IV SOLUTION) IMPLANT
PACK VAGINAL WOMENS (CUSTOM PROCEDURE TRAY) ×2 IMPLANT
PAD DRESSING TELFA 3X8 NADH (GAUZE/BANDAGES/DRESSINGS) IMPLANT
PAD PREP 24X48 CUFFED NSTRL (MISCELLANEOUS) ×2 IMPLANT
SCOPETTES 8  STERILE (MISCELLANEOUS)
SCOPETTES 8 STERILE (MISCELLANEOUS) ×2 IMPLANT
SUT VIC AB 0 CT1 36 (SUTURE) IMPLANT
SUT VIC AB 2-0 SH 27 (SUTURE)
SUT VIC AB 2-0 SH 27XBRD (SUTURE) IMPLANT
SUT VIC AB 3-0 PS2 18 (SUTURE)
SUT VIC AB 3-0 PS2 18XBRD (SUTURE) IMPLANT
SUT VIC AB 3-0 SH 27 (SUTURE)
SUT VIC AB 3-0 SH 27X BRD (SUTURE) IMPLANT
SUT VIC AB 4-0 PS2 18 (SUTURE) IMPLANT
SUT VICRYL 0 UR6 27IN ABS (SUTURE) IMPLANT
TOWEL OR 17X24 6PK STRL BLUE (TOWEL DISPOSABLE) ×4 IMPLANT
TUBE CONNECTING 12X1/4 (SUCTIONS) ×2 IMPLANT
VACUUM HOSE 7/8X10 W/ WAND (MISCELLANEOUS) ×1 IMPLANT
VACUUM HOSE/TUBING 7/8INX6FT (MISCELLANEOUS) ×2 IMPLANT
WATER STERILE IRR 500ML POUR (IV SOLUTION) ×2 IMPLANT

## 2017-09-11 NOTE — H&P (Signed)
H&P  HPI:  Regina Huber is a 56 y.o. year old No obstetric history on file. initially seen in consultation on 10/20/15 referred by Dr Julien Girt for [place].  She then underwent a CO2 laser of the upper vagina  on 06/08/75 without complications.  Her postoperative course was uncomplicated.  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   Review of systems: Constitutional:  She has no weight gain or weight loss. She has no fever or chills. Eyes: No blurred vision Ears, Nose, Mouth, Throat: No dizziness, headaches or changes in hearing. No mouth sores. Cardiovascular: No chest pain, palpitations or edema. Respiratory:  No shortness of breath, wheezing or cough Gastrointestinal: She has normal bowel movements without diarrhea or constipation. She denies any nausea or vomiting. She denies blood in her stool or heart burn. Genitourinary:  She denies pelvic pain, pelvic pressure or changes in her urinary function. She has no hematuria, dysuria, or incontinence. She has no irregular vaginal bleeding or vaginal discharge Musculoskeletal: Denies muscle weakness or joint pains.  Skin:  She has no skin changes, rashes or itching Neurological:  Denies dizziness or headaches. No neuropathy, no numbness or tingling. Psychiatric:  She denies depression or anxiety. Hematologic/Lymphatic:   No easy bruising or bleeding   Physical Exam: Blood pressure 133/77, pulse 62, temperature 98.1 F (36.7 C), resp. rate 20, weight 202 lb 14.4 oz (92 kg), SpO2 99 %. General: Well dressed, well nourished in no apparent distress.   Genitourinary: Normal EGBUS  Vagina: see below Extremities: No cyanosis, clubbing or edema.  No calf tenderness or erythema. No palpable cords. Psychiatric: Mood and affect are appropriate. Neurological: Awake, alert and oriented x 3. Sensation is intact, no neuropathy.  Musculoskeletal: No pain, normal strength and range of  motion.  PROCEDURE NOTE: Vaginal biopsy with colposcopy. preop dx: VAIN 3 pap Postop dx: same Verbal consent obtained Time out performed Speculum placed.  Vagina swabbed with acetic acid. Colposcopy performed of entire vagina. Left apex puckered with cuff closure (not tumor). Acetowhite changes broadly to posterior upper vagina and left apex. Subtle. No abnormal vasculature or apparent invasive malignancy. Biopsy taken from a rugation in upper left apex with forcep. Hemostasis achieved with silver nitrate. EBL: minimal Complications: none Specimen: left vaginal apex.   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   S/p CO2 laser on 01/04/16.   Plan: Plan for vaginal laser (repeat). Also discused vaginectomy and effudex medical therapy. I feel that laser might be the first best option to preserve vaginal length. Discussed surgical plan and anticipated healing. Patient electing for procedure in August due to work commitments. I think this is reasonable given the colposcopic appearance of the vagina and my low suspicion for occult malignancy.  Donaciano Eva, MD

## 2017-09-11 NOTE — Anesthesia Procedure Notes (Signed)
Procedure Name: LMA Insertion Date/Time: 09/11/2017 11:40 AM Performed by: Wanita Chamberlain Pre-anesthesia Checklist: Patient identified, Emergency Drugs available, Suction available, Patient being monitored and Timeout performed Patient Re-evaluated:Patient Re-evaluated prior to induction Oxygen Delivery Method: Circle system utilized Preoxygenation: Pre-oxygenation with 100% oxygen Induction Type: IV induction Ventilation: Mask ventilation without difficulty LMA: LMA inserted LMA Size: 4.0 Number of attempts: 1 Placement Confirmation: positive ETCO2 and breath sounds checked- equal and bilateral Tube secured with: Tape Dental Injury: Teeth and Oropharynx as per pre-operative assessment

## 2017-09-11 NOTE — Anesthesia Postprocedure Evaluation (Signed)
Anesthesia Post Note  Patient: Regina Huber  Procedure(s) Performed: Procedure(s) (LRB): CO2 LASER VAPORIZATION OF THE VAGINA (N/A)     Patient location during evaluation: PACU Anesthesia Type: General Level of consciousness: awake and alert Pain management: pain level controlled Vital Signs Assessment: post-procedure vital signs reviewed and stable Respiratory status: spontaneous breathing, nonlabored ventilation, respiratory function stable and patient connected to nasal cannula oxygen Cardiovascular status: blood pressure returned to baseline and stable Postop Assessment: no apparent nausea or vomiting Anesthetic complications: no    Last Vitals:  Vitals:   09/11/17 1230 09/11/17 1245  BP: 128/74 131/81  Pulse: (!) 57 67  Resp: 12 (!) 22  Temp:    SpO2: 100% 96%    Last Pain:  Vitals:   09/11/17 1036  TempSrc: Oral                 Montez Hageman

## 2017-09-11 NOTE — Op Note (Addendum)
PATIENT: Regina Huber DATE: 09/11/17   Preop Diagnosis:   Postoperative Diagnosis:   Surgery: CO2 laser of the vagina  Surgeons:  Donaciano Eva, MD Assistant: none  Anesthesia: General   Estimated blood loss: mnimal  IVF:  25ml   Urine output: 50 ml   Complications: None   Pathology: none   Operative findings: acetowhite changes at central and left cuff, left fornix very puckered  Procedure: The patient was identified in the preoperative holding area. Informed consent was signed on the chart. Patient was seen history was reviewed and exam was performed.   The patient was then taken to the operating room and placed in the supine position with SCD hose on. General anesthesia was then induced without difficulty. She was then placed in the dorsolithotomy position. The perineum and vagina was prepped with Betadine. The patient was then draped after the prep was dried. A Foley catheter was inserted into the bladder under sterile conditions to drain the bladder then removed.  Timeout was performed the patient, procedure, antibiotic, allergy, and length of procedure. A coated speculum was placed in the vagina. 5% acetic acid solution was applied to the entire vagina. The vaginal tissues were inspected for areas of acetowhite changes or leukoplakia. The lesion was identified.    The patient's surgical field was draped with wet towels. The staff and patient ensured laser-safe eyewear and masks were fitted. The laser was set to 12 watts continuous. The laser was tested for accuracy on a tongue depressor.  The laser was applied to the circumscribed area of the vagina that had been previously identified. A coated allis clamp was used to evert tissue from the left vaginal fornix to expose it. The tissue was ablated to the desired depth and the eschar was removed with a moistened scopette. When the entire lesion had been ablated the procedure was complete.  All instrument,  suture, laparotomy, Ray-Tec, and needle counts were correct x2. The patient tolerated the procedure well and was taken recovery room in stable condition. This is Regina Huber dictating an operative note on Regina Huber.  Donaciano Eva, MD

## 2017-09-11 NOTE — Discharge Instructions (Signed)
Vaginal Laser After Care    ACTIVITY  Rest as much as possible the first two days after discharge.  No restrictions on heavy lifting   Do not drive a car for 24 hours  Increase activity gradually.  You may exercise whenever you feel comfortable to do so, as early as the day after surgery.  NUTRITION  You may resume your normal diet.  Drink 6 to 8 glasses of fluids a day.  Eat a healthy, balanced diet including portions of food from the meat (protein), milk, fruit, vegetable, and bread groups.  Your caregiver may recommend you take a multivitamin with iron.  ELIMINATION  If constipation occurs, drink more liquids, and add more fruits, vegetables, and bran to your diet. You may take a mild laxative, such as Milk of Magnesia, Metamucil, or a stool softener such as Colace, with permission from your caregiver. HYGIENE  You may shower and wash your hair.  You may bathe in the tub  Do not add any bath oils or chemicals to your bath water. HOME CARE INSTRUCTIONS   Follow your caregiver's instructions about medicines, activity, and follow-up appointments after surgery.  Do not drink alcohol while taking pain medicine.  You may take over-the-counter medicine for pain, recommended by your caregiver.  If your pain is not relieved with medicine, call your caregiver.  Do not douche or use tampons (use a nonperfumed sanitary pad).  Do not have sexual intercourse until your caregiver gives you permission (typically 6 weeks postoperatively). Hugging, kissing, and playful sexual activity is fine with your caregiver's permission.  Warm sitz baths, with your caregiver's permission, are helpful to control swelling and discomfort.  Take showers instead of baths, until your caregiver gives you permission to take baths.  You may take a mild medicine for constipation, recommended by your caregiver. Bran foods and drinking a lot of fluids will help with constipation.  Make sure your  family understands everything about your operation and recovery. SEEK MEDICAL CARE IF:   You notice swelling and redness around the wound area.  You notice a foul smell coming from the wound or on the surgical dressing.  You notice the wound is separating.  You have painful or bloody urination.  You develop nausea and vomiting.  You develop diarrhea.  You develop a rash.  You have a reaction or allergy from the medicine.  You feel dizzy or light-headed.  You need stronger pain medicine. SEEK IMMEDIATE MEDICAL CARE IF:   You develop a temperature of 102 F (38.9 C) or higher.  You pass out.  You develop leg or chest pain.  You develop abdominal pain.  You develop shortness of breath.  You develop bleeding from the wound area.  You see pus in the wound area. MAKE SURE YOU:   Understand these instructions.  Will watch your condition.  Will get help right away if you are not doing well or get worse.  Contact Dr Denman George at # [phone]. After hours this line will connect to the after-hours-nurse line which will contact the doctor on call.   Post Anesthesia Home Care Instructions  Activity: Get plenty of rest for the remainder of the day. A responsible individual must stay with you for 24 hours following the procedure.  For the next 24 hours, DO NOT: -Drive a car -Paediatric nurse -Drink alcoholic beverages -Take any medication unless instructed by your physician -Make any legal decisions or sign important papers.  Meals: Start with liquid foods such  as gelatin or soup. Progress to regular foods as tolerated. Avoid greasy, spicy, heavy foods. If nausea and/or vomiting occur, drink only clear liquids until the nausea and/or vomiting subsides. Call your physician if vomiting continues.  Special Instructions/Symptoms: Your throat may feel dry or sore from the anesthesia or the breathing tube placed in your throat during surgery. If this causes discomfort,  gargle with warm salt water. The discomfort should disappear within 24 hours.  If you had a scopolamine patch placed behind your ear for the management of post- operative nausea and/or vomiting:  1. The medication in the patch is effective for 72 hours, after which it should be removed.  Wrap patch in a tissue and discard in the trash. Wash hands thoroughly with soap and water. 2. You may remove the patch earlier than 72 hours if you experience unpleasant side effects which may include dry mouth, dizziness or visual disturbances. 3. Avoid touching the patch. Wash your hands with soap and water after contact with the patch.     Document Released: 07/16/2004 Document Revised: 04/18/2014 Document Reviewed: 11/03/2009 [facility] Patient Information 2015 [place], [place]. This information is not intended to replace advice given to you by your health care provider. Make sure you discuss any questions you have with your health care provider.

## 2017-09-11 NOTE — Anesthesia Preprocedure Evaluation (Addendum)
Anesthesia Evaluation  Patient identified by MRN, date of birth, ID band Patient awake    Reviewed: Allergy & Precautions, NPO status , Patient's Chart, lab work & pertinent test results  History of Anesthesia Complications (+) PONV  Airway Mallampati: II  TM Distance: >3 FB Neck ROM: Full    Dental no notable dental hx. (+) Caps, Teeth Intact, Dental Advisory Given,    Pulmonary neg pulmonary ROS,    Pulmonary exam normal breath sounds clear to auscultation       Cardiovascular hypertension, Pt. 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 No medications. No symptoms) negative psych ROS   GI/Hepatic negative GI ROS, Neg liver ROS, Hx of motion sickness   Endo/Other  negative endocrine ROS  Renal/GU negative Renal ROS  negative genitourinary   Musculoskeletal negative musculoskeletal ROS (+)   Abdominal   Peds negative pediatric ROS (+)  Hematology negative hematology ROS (+)   Anesthesia Other Findings   Reproductive/Obstetrics negative OB ROS                          Anesthesia Physical Anesthesia Plan  ASA: II  Anesthesia Plan: General   Post-op Pain Management:    Induction: Intravenous  PONV Risk Score and Plan: 3 and Ondansetron, Dexamethasone, Midazolam and Treatment may vary due to age or medical condition  Airway Management Planned: LMA  Additional Equipment:   Intra-op Plan:   Post-operative Plan: Extubation in OR  Informed Consent: I have reviewed the patients History and Physical, chart, labs and discussed the procedure including the risks, benefits and alternatives for the proposed anesthesia with the patient or authorized representative who has indicated his/her understanding and acceptance.   Dental advisory given  Plan Discussed with: CRNA  Anesthesia Plan  Comments:         Anesthesia Quick Evaluation

## 2017-09-11 NOTE — Transfer of Care (Signed)
Immediate Anesthesia Transfer of Care Note  Patient: Regina Huber  Procedure(s) Performed: Procedure(s): CO2 LASER VAPORIZATION OF THE VAGINA (N/A)  Patient Location: PACU  Anesthesia Type:General  Level of Consciousness: awake, alert , oriented and patient cooperative  Airway & Oxygen Therapy: Patient Spontanous Breathing and Patient connected to nasal cannula oxygen  Post-op Assessment: Report given to RN and Post -op Vital signs reviewed and stable  Post vital signs: Reviewed and stable  Last Vitals:  Vitals:   09/11/17 1036  BP: 136/81  Pulse: 67  Resp: 16  Temp: 36.7 C  SpO2: 99%    Last Pain:  Vitals:   09/11/17 1036  TempSrc: Oral      Patients Stated Pain Goal: 7 (63/78/58 8502)  Complications: No apparent anesthesia complications

## 2017-09-12 ENCOUNTER — Encounter (HOSPITAL_BASED_OUTPATIENT_CLINIC_OR_DEPARTMENT_OTHER): Payer: Self-pay | Admitting: Gynecologic Oncology

## 2017-09-12 NOTE — Addendum Note (Signed)
Addendum  created 09/12/17 1411 by Montez Hageman, MD   Anesthesia Staff edited

## 2017-09-22 ENCOUNTER — Telehealth: Payer: Self-pay | Admitting: *Deleted

## 2017-09-22 ENCOUNTER — Encounter: Payer: Self-pay | Admitting: Gynecologic Oncology

## 2017-09-22 MED FILL — AMLODIPINE BESYLATE 5 MG TA: 5 | 90 days supply | Qty: 90 | Fill #3

## 2017-09-22 NOTE — Telephone Encounter (Signed)
Contacted the patient and gave the post op appt for October 22nd at 2:15pm

## 2017-10-06 ENCOUNTER — Ambulatory Visit: Payer: 59 | Attending: Gynecologic Oncology | Admitting: Gynecologic Oncology

## 2017-10-06 ENCOUNTER — Encounter: Payer: Self-pay | Admitting: Gynecologic Oncology

## 2017-10-06 VITALS — BP 152/76 | HR 68 | Temp 98.3°F | Resp 16 | Ht 67.0 in | Wt 205.7 lb

## 2017-10-06 DIAGNOSIS — N891 Moderate vaginal dysplasia: Secondary | ICD-10-CM

## 2017-10-06 DIAGNOSIS — K1379 Other lesions of oral mucosa: Secondary | ICD-10-CM

## 2017-10-06 DIAGNOSIS — D072 Carcinoma in situ of vagina: Secondary | ICD-10-CM | POA: Insufficient documentation

## 2017-10-06 MED ORDER — MAGIC MOUTHWASH W/LIDOCAINE: 5.0000 mL | Freq: Three times a day (TID) | ORAL | 0 refills | Status: DC | PRN

## 2017-10-06 NOTE — Patient Instructions (Signed)
Please follow-up with Dr Julien Girt in 6 months (April, 2019) for a pap smear.  You have been prescribed magic mouthwash to use 3 times a day as needed for mouth soreness.

## 2017-10-06 NOTE — Progress Notes (Signed)
POSTOPERATIVE FOLLOW-UP  HPI:  Regina Huber is a 56 y.o. year old initially seen in consultation on 10/20/15 referred by Dr Julien Girt for [place].  She then underwent a CO2 laser of the upper vagina  on 5/49/82 without complications.    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   On 09/11/17 she underwent repeat CO2 laser of the entire cuff and upper vagina (due to discrepancy between pap and biopsy).  Since surgery she has done well with no complaints except for dry irritated lips and mouth.  Review of systems: Constitutional:  She has no weight gain or weight loss. She has no fever or chills. Eyes: No blurred vision Ears, Nose, Mouth, Throat: No dizziness, headaches or changes in hearing.+ very dry lips and sore mouth since anesthesia. Cardiovascular: No chest pain, palpitations or edema. Respiratory:  No shortness of breath, wheezing or cough Gastrointestinal: She has normal bowel movements without diarrhea or constipation. She denies any nausea or vomiting. She denies blood in her stool or heart burn. Genitourinary:  She denies pelvic pain, pelvic pressure or changes in her urinary function. She has no hematuria, dysuria, or incontinence. She has no irregular vaginal bleeding or vaginal discharge Musculoskeletal: Denies muscle weakness or joint pains.  Skin:  She has no skin changes, rashes or itching Neurological:  Denies dizziness or headaches. No neuropathy, no numbness or tingling. Psychiatric:  She denies depression or anxiety. Hematologic/Lymphatic:   No easy bruising or bleeding   Physical Exam: Blood pressure (!) 152/76, pulse 68, temperature 98.3 F (36.8 C), temperature source Oral, resp. rate 16, height 5\' 7"  (1.702 m), weight 205 lb 11.2 oz (93.3 kg), SpO2 97 %. General: Well dressed, well nourished in no apparent distress.   Genitourinary:  Normal EGBUS  Vagina: well healed laser site. Extremities: No cyanosis, clubbing or edema.  No calf tenderness or erythema. No palpable cords. Psychiatric: Mood and affect are appropriate. Neurological: Awake, alert and oriented x 3. Sensation is intact, no neuropathy.  Musculoskeletal: No pain, normal strength and range of motion.  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   S/p CO2 laser on 09/11/17   Plan: Plan for pap smear with Dr Julien Girt in 6 months.  Donaciano Eva, MD

## 2017-10-08 MED FILL — HYDROCHLOROTHIAZIDE 25 MG T: 25 | 90 days supply | Qty: 90 | Fill #1

## 2017-10-13 ENCOUNTER — Other Ambulatory Visit: Payer: Self-pay

## 2017-10-13 ENCOUNTER — Other Ambulatory Visit: Payer: Self-pay | Admitting: Family Medicine

## 2017-10-13 ENCOUNTER — Encounter: Payer: Self-pay | Admitting: Family Medicine

## 2017-10-13 MED ORDER — ALPRAZOLAM 0.25 MG PO TABS: 0.2500 mg | ORAL_TABLET | Freq: Every evening | ORAL | 0 refills | Status: DC | PRN

## 2017-10-13 NOTE — Progress Notes (Signed)
Printed per TA/will fax when signed/thx dmf

## 2017-10-14 MED FILL — CITALOPRAM HBR 40 MG TABLET: 40 | 90 days supply | Qty: 90 | Fill #0

## 2017-10-14 MED FILL — ALPRAZolam 0.25 MG TABS: 0.25 | 30 days supply | Qty: 30 | Fill #0

## 2017-11-17 DIAGNOSIS — Z1231 Encounter for screening mammogram for malignant neoplasm of breast: Secondary | ICD-10-CM | POA: Diagnosis not present

## 2017-11-18 ENCOUNTER — Other Ambulatory Visit: Payer: Self-pay | Admitting: Family Medicine

## 2017-11-18 NOTE — Telephone Encounter (Signed)
Denied/has been well over a year since last seen/asked pharm to plz advise pt to sched appt first/thx dmf

## 2017-11-18 NOTE — Telephone Encounter (Signed)
Denied/has been well over a year since seen/asked pharm to advise pt to sched appt/thx dmf

## 2017-11-19 LAB — HM MAMMOGRAPHY

## 2017-11-20 ENCOUNTER — Ambulatory Visit (INDEPENDENT_AMBULATORY_CARE_PROVIDER_SITE_OTHER): Payer: 59 | Admitting: Family Medicine

## 2017-11-20 ENCOUNTER — Encounter: Payer: Self-pay | Admitting: Family Medicine

## 2017-11-20 VITALS — BP 126/78 | HR 67 | Temp 98.1°F | Ht 67.0 in | Wt 204.8 lb

## 2017-11-20 DIAGNOSIS — I10 Essential (primary) hypertension: Secondary | ICD-10-CM

## 2017-11-20 DIAGNOSIS — F3289 Other specified depressive episodes: Secondary | ICD-10-CM | POA: Diagnosis not present

## 2017-11-20 DIAGNOSIS — E7849 Other hyperlipidemia: Secondary | ICD-10-CM | POA: Diagnosis not present

## 2017-11-20 DIAGNOSIS — G4700 Insomnia, unspecified: Secondary | ICD-10-CM

## 2017-11-20 MED ORDER — PRAVASTATIN SODIUM 20 MG PO TABS: 20.0000 mg | ORAL_TABLET | Freq: Every day | ORAL | 1 refills | Status: DC

## 2017-11-20 MED ORDER — HYDROCHLOROTHIAZIDE 25 MG PO TABS: 25.0000 mg | ORAL_TABLET | Freq: Every day | ORAL | 1 refills | Status: DC

## 2017-11-20 MED ORDER — ALPRAZOLAM 0.25 MG PO TABS: 0.2500 mg | ORAL_TABLET | Freq: Every evening | ORAL | 1 refills | Status: DC | PRN

## 2017-11-20 MED ORDER — CITALOPRAM HYDROBROMIDE 40 MG PO TABS: 40.0000 mg | ORAL_TABLET | Freq: Every day | ORAL | 1 refills | Status: DC

## 2017-11-20 MED ORDER — AMLODIPINE BESYLATE 5 MG PO TABS: 5.0000 mg | ORAL_TABLET | Freq: Every day | ORAL | 1 refills | Status: DC

## 2017-11-20 MED FILL — PRAVASTATIN SODIUM 20 MG TA: 20 | 90 days supply | Qty: 90 | Fill #0

## 2017-11-20 MED FILL — ALPRAZolam 0.25 MG TABS: 0.25 | 90 days supply | Qty: 90 | Fill #0

## 2017-11-20 NOTE — Assessment & Plan Note (Signed)
Well controlled with current dose of citalopram and rarely used xanax. No changes made today.

## 2017-11-20 NOTE — Assessment & Plan Note (Signed)
Continue current rx.  No changes made.  

## 2017-11-20 NOTE — Progress Notes (Signed)
Subjective:   Patient ID: Regina Huber, female    DOB: 1961/03/03, 56 y.o.   MRN: [id number]  Regina Huber is a pleasant 56 y.o. year old female who presents to clinic today with Follow-up (Patient is here today to F/U.  She is not currently fasting.  She is needing refills on Celexa, Alprazolam, and Amlodipine.  States that she is doing well and without coplaints.)  on 11/20/2017  HPI:   Doing well.  Her twin girls turn 64 tomorrow and they are taking a trip out [place]!  HTN-  Has been stable on HCTZ 25 mg daily. Lab Results  Component Value Date   CREATININE 0.60 09/11/2017    She has no h/o CAD.  No h/o palpitations.  Does have some LE edema, always has had some. HLD- on pravachol 20 mg daily.  Denies any myalgias.  Lab Results  Component Value Date   CHOL 194 05/01/2017   HDL 47.20 05/01/2017   LDLCALC 110 (H) 05/01/2017   LDLDIRECT 116.0 03/25/2016   TRIG 183.0 (H) 05/01/2017   CHOLHDL 4 05/01/2017     Depression- has been on celexa 40 mg daily for years. No SI or HI. Does use xanax a couple of times a week for insomnia.    Current Outpatient Medications on File Prior to Visit  Medication Sig Dispense Refill  . ibuprofen (ADVIL,MOTRIN) 600 MG tablet Take 1 tablet (600 mg total) by mouth every 6 (six) hours as needed. 30 tablet 0  . hydrochlorothiazide (HYDRODIURIL) 25 MG tablet Take 1 tablet (25 mg total) by mouth daily. (Patient taking differently: Take 25 mg by mouth every evening. ) 90 tablet 1   No current facility-administered medications on file prior to visit.     Allergies  Allergen Reactions  . Penicillins Rash    - has tolerated cephalosporins    Past Medical History:  Diagnosis Date  . Depression   . Dyspnea    slight sob when lying flat  . 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 Hyperlipidemia   . Hypertension   . Myasthenia gravis ([place])    dx 2005   s/p  thymectomy  2006--  per pt no other symtpoms since  . Neck stiffness   . Palpitations   . Transfusion history    s/p hysterectomy. pt states she received 9 pints of blood  . Vaginal intraepithelial neoplasia III (VAIN III)     Past Surgical History:  Procedure Laterality Date  . ABDOMINAL HYSTERECTOMY  05-07-2000   w/  Right Ovarian Cystectomy  . CARDIOVASCULAR STRESS TEST  12-11-2016  dr Daneen Schick   normal nuclera study w/ no ischemia/  normal LV function and wall motion, nuclear stress ef 59% (BP demonstrated hypertensive response to exercise)  . [place]  . CO2 LASER APPLICATION N/A 7/51/0258   Procedure: CO2 LASER VAPORIZATION OF THE VAGINA;  Surgeon: Everitt Amber, MD;  Location: [place];  Service: Gynecology;  Laterality: N/A;  . CO2 LASER APPLICATION N/A 05/12/7823   Procedure: CO2 LASER VAPORIZATION OF THE VAGINA;  Surgeon: Everitt Amber, MD;  Location: [place];  Service: Gynecology;  Laterality: N/A;  . COMBINED ABDOMINOPLASTY AND LIPOSUCTION  03-12-2002  . LAPAROTOMY/  SUTURE INTRAPERITONEAL BLEEDING  05-08-2000  . LUMBAR DISC SURGERY  11-07-2010   L5 - S1  . TRANSSTERNAL THYMECTOMY  04-29-2005  .  TRANSTHORACIC ECHOCARDIOGRAM  12-11-2016  dr Daneen Schick   grade 1 diastolic dysfunction, ef 34-19%/  trivial MR, PR, and TR    Family History  Problem Relation Age of Onset  . Cancer Mother 60       breast CA  . Hypertension Mother   . Diabetes Mother   . Cancer Father 55       colon CA  . Alcohol abuse Father   . Hypertension Father   . Stroke Father   . Heart disease Brother 72       MI    Social History   Socioeconomic History  . Marital status: Single    Spouse name: Not on file  . Number of children: Not on file  . Years of education: Not on file  . Highest education level: Not on file  Social Needs  . Financial resource strain: Not on file  . Food insecurity - worry: Not on file  . Food insecurity -  inability: Not on file  . Transportation needs - medical: Not on file  . Transportation needs - non-medical: Not on file  Occupational History  . Not on file  Tobacco Use  . Smoking status: Never Smoker  . Smokeless tobacco: Never Used  Substance and Sexual Activity  . Alcohol use: Yes    Comment: rarely  . Drug use: No  . Sexual activity: Not on file  Other Topics Concern  . Not on file  Social History Narrative   Married, twin daughters born 23, son born 1988   Works at [employer] in [employer], mining.   The PMH, PSH, Social History, Family History, Medications, and allergies have been reviewed in [facility], and have been updated if relevant.    Review of Systems  Constitutional: Negative.   HENT: Negative.   Eyes: Negative.   Respiratory: Negative.   Cardiovascular: Negative.   Endocrine: Negative.   Genitourinary: Negative.   Musculoskeletal: Negative.   Allergic/Immunologic: Negative.   Neurological: Negative.   Hematological: Negative.   Psychiatric/Behavioral: Negative.   All other systems reviewed and are negative.      Objective:    BP 126/78 (BP Location: Left Arm, Patient Position: Sitting, Cuff Size: Normal)   Pulse 67   Temp 98.1 F (36.7 C) (Oral)   Ht 5\' 7"  (1.702 m)   Wt 204 lb 12.8 oz (92.9 kg)   SpO2 96%   BMI 32.08 kg/m    Physical Exam  Constitutional: She is oriented to person, place, and time. She appears well-developed and well-nourished. No distress.  HENT:  Head: Normocephalic and atraumatic.  Eyes: Conjunctivae are normal.  Cardiovascular: Normal rate and regular rhythm.  Pulmonary/Chest: Effort normal and breath sounds normal.  Musculoskeletal: Normal range of motion.  Neurological: She is alert and oriented to person, place, and time. No cranial nerve deficit.  Skin: Skin is warm and dry. She is not diaphoretic.  Psychiatric: She has a normal mood and affect. Her behavior is normal. Judgment and thought content normal.  Nursing note and vitals  reviewed.          Assessment & Plan:   Other depression  Insomnia, unspecified type  Essential hypertension  Other hyperlipidemia No Follow-up on file.

## 2017-11-20 NOTE — Assessment & Plan Note (Signed)
Continue current dose of statin. Check labs when she is fasting- orders entered.

## 2017-11-20 NOTE — Assessment & Plan Note (Signed)
Well controlled. No changes made today. 

## 2017-11-20 NOTE — Patient Instructions (Signed)
Happy holidays!!  Happy birthday for your girls too!

## 2017-12-12 ENCOUNTER — Encounter: Payer: Self-pay | Admitting: Family Medicine

## 2017-12-12 ENCOUNTER — Ambulatory Visit (INDEPENDENT_AMBULATORY_CARE_PROVIDER_SITE_OTHER): Payer: 59 | Admitting: Family Medicine

## 2017-12-12 ENCOUNTER — Ambulatory Visit (HOSPITAL_COMMUNITY): Admission: RE | Admit: 2017-12-12 | Payer: 59 | Source: Ambulatory Visit

## 2017-12-12 ENCOUNTER — Ambulatory Visit (INDEPENDENT_AMBULATORY_CARE_PROVIDER_SITE_OTHER): Payer: 59

## 2017-12-12 VITALS — BP 126/64 | HR 63 | Temp 98.7°F | Ht 67.0 in | Wt 211.4 lb

## 2017-12-12 DIAGNOSIS — M79671 Pain in right foot: Secondary | ICD-10-CM | POA: Diagnosis not present

## 2017-12-12 DIAGNOSIS — M109 Gout, unspecified: Secondary | ICD-10-CM | POA: Diagnosis not present

## 2017-12-12 DIAGNOSIS — M79674 Pain in right toe(s): Secondary | ICD-10-CM | POA: Diagnosis not present

## 2017-12-12 LAB — URIC ACID: Uric Acid, Serum: 8.2 mg/dL — ABNORMAL HIGH (ref 2.4–7.0)

## 2017-12-12 LAB — CBC
HCT: 45.3 % (ref 36.0–46.0)
Hemoglobin: 14.5 g/dL (ref 12.0–15.0)
MCHC: 32.1 g/dL (ref 30.0–36.0)
MCV: 86.4 fl (ref 78.0–100.0)
Platelets: 344 10*3/uL (ref 150.0–400.0)
RBC: 5.25 Mil/uL — ABNORMAL HIGH (ref 3.87–5.11)
RDW: 14.1 % (ref 11.5–15.5)
WBC: 9.2 10*3/uL (ref 4.0–10.5)

## 2017-12-12 MED ORDER — COLCHICINE 0.6 MG PO TABS: 0.6000 mg | ORAL_TABLET | Freq: Two times a day (BID) | ORAL | 0 refills | Status: DC

## 2017-12-12 MED ORDER — KETOROLAC TROMETHAMINE 60 MG/2ML IM SOLN
60.0000 mg | Freq: Once | INTRAMUSCULAR | Status: AC
  Administered 2017-12-12: 60 mg via INTRAMUSCULAR

## 2017-12-12 MED ORDER — INDOMETHACIN ER 75 MG PO CPCR: 75.0000 mg | ORAL_CAPSULE | Freq: Two times a day (BID) | ORAL | 0 refills | Status: DC

## 2017-12-12 NOTE — Patient Instructions (Addendum)

## 2017-12-12 NOTE — Progress Notes (Signed)
Subjective:  Patient ID: Regina Huber, female    DOB: 1961/06/24  Age: 56 y.o. MRN: [id number]  CC: Foot Pain (Patient is here today C/O right foot pain x3d. Denies any injury.  But states that on Christmas Eve she wore boots and the next day she was in severe pain. On pain scale is an 8 when she is without Ibuprofen.  Noticable edema and erythema.)   HPI LUN MURO presents for evaluation for pain and swelling in her right foot.  This started 4 days ago and she denies a history of injury, prolonged walking or standing, or changes in her medicines.  She was not aware wearing high-heeled shoes.  she is a vegetarian and does not eat meat or drink alcohol.  Her mother developed gout in her 39s and had experienced tophi with her disease.  Outpatient Medications Prior to Visit  Medication Sig Dispense Refill  . ALPRAZolam (XANAX) 0.25 MG tablet Take 1 tablet (0.25 mg total) by mouth at bedtime as needed. 90 tablet 1  . amLODipine (NORVASC) 5 MG tablet Take 1 tablet (5 mg total) by mouth daily. 90 tablet 1  . citalopram (CELEXA) 40 MG tablet Take 1 tablet (40 mg total) by mouth daily. 90 tablet 1  . hydrochlorothiazide (HYDRODIURIL) 25 MG tablet Take 1 tablet (25 mg total) by mouth daily. 90 tablet 1  . ibuprofen (ADVIL,MOTRIN) 600 MG tablet Take 1 tablet (600 mg total) by mouth every 6 (six) hours as needed. 30 tablet 0  . pravastatin (PRAVACHOL) 20 MG tablet Take 1 tablet (20 mg total) by mouth daily. 90 tablet 1   No facility-administered medications prior to visit.     ROS Review of Systems  Constitutional: Negative for chills, fatigue and fever.  Respiratory: Negative.   Cardiovascular: Negative.   Gastrointestinal: Negative.   Musculoskeletal: Positive for arthralgias, gait problem and joint swelling.  Skin: Positive for color change. Negative for rash and wound.  Allergic/Immunologic: Negative for immunocompromised state.  Neurological: Negative for weakness and numbness.    Hematological: Does not bruise/bleed easily.  Psychiatric/Behavioral: Negative.     Objective:  BP 126/64 (BP Location: Right Arm, Patient Position: Sitting, Cuff Size: Normal)   Pulse 63   Temp 98.7 F (37.1 C) (Oral)   Ht 5\' 7"  (1.702 m)   Wt 211 lb 6.4 oz (95.9 kg)   SpO2 97%   BMI 33.11 kg/m   BP Readings from Last 3 Encounters:  12/12/17 126/64  11/20/17 126/78  10/06/17 (!) 152/76    Wt Readings from Last 3 Encounters:  12/12/17 211 lb 6.4 oz (95.9 kg)  11/20/17 204 lb 12.8 oz (92.9 kg)  10/06/17 205 lb 11.2 oz (93.3 kg)    Physical Exam  Constitutional: She is oriented to person, place, and time. She appears well-developed and well-nourished. No distress.  HENT:  Head: Normocephalic and atraumatic.  Right Ear: External ear normal.  Left Ear: External ear normal.  Eyes: Right eye exhibits no discharge. Left eye exhibits no discharge. No scleral icterus.  Neck: No JVD present. No tracheal deviation present.  Cardiovascular:  Pulses:      Dorsalis pedis pulses are 1+ on the right side.       Posterior tibial pulses are 1+ on the right side.  Capillary refill is brisk <1sec   Pulmonary/Chest: Effort normal.  Musculoskeletal:       Right foot: There is tenderness, bony tenderness and swelling. There is normal range of motion and normal  capillary refill.       Feet:  Neurological: She is alert and oriented to person, place, and time.  Skin: Skin is warm and dry. No rash noted. She is not diaphoretic. There is erythema. No pallor.  Psychiatric: She has a normal mood and affect. Her behavior is normal.    Lab Results  Component Value Date   WBC 8.4 05/01/2017   HGB 13.3 09/11/2017   HCT 39.0 09/11/2017   PLT 336.0 05/01/2017   GLUCOSE 97 09/11/2017   CHOL 194 05/01/2017   TRIG 183.0 (H) 05/01/2017   HDL 47.20 05/01/2017   LDLDIRECT 116.0 03/25/2016   LDLCALC 110 (H) 05/01/2017   ALT 20 05/01/2017   AST 17 05/01/2017   NA 139 09/11/2017   K 4.7  09/11/2017   CL 100 (L) 09/11/2017   CREATININE 0.60 09/11/2017   BUN 10 09/11/2017   CO2 32 05/01/2017   TSH 0.94 05/01/2017    No results found.  Assessment & Plan:   Chereese was seen today for foot pain.  Diagnoses and all orders for this visit:  Right foot pain -     ketorolac (TORADOL) injection 60 mg -     DG Foot Complete Right  Acute gout involving toe of right foot, unspecified cause -     Uric acid -     CBC -     colchicine 0.6 MG tablet; Take 1 tablet (0.6 mg total) by mouth 2 (two) times daily. For 7-10 days and then as needed for attacks. -     indomethacin (INDOCIN SR) 75 MG CR capsule; Take 1 capsule (75 mg total) by mouth 2 (two) times daily with a meal. For 7-10 days and then as needed for attacks. -     Cancel: DG Foot Complete Right; Future -     Cancel: DG Foot Complete Right -     DG Foot Complete Right   I am having Wylene Simmer. Appelbaum start on colchicine and indomethacin. I am also having her maintain her ibuprofen, ALPRAZolam, amLODipine, citalopram, pravastatin, and hydrochlorothiazide. We administered ketorolac.  Meds ordered this encounter  Medications  . ketorolac (TORADOL) injection 60 mg  . colchicine 0.6 MG tablet    Sig: Take 1 tablet (0.6 mg total) by mouth 2 (two) times daily. For 7-10 days and then as needed for attacks.    Dispense:  60 tablet    Refill:  0  . indomethacin (INDOCIN SR) 75 MG CR capsule    Sig: Take 1 capsule (75 mg total) by mouth 2 (two) times daily with a meal. For 7-10 days and then as needed for attacks.    Dispense:  60 capsule    Refill:  0   Patient will start her Indocin and colchicine today.  She will hold her ibuprofen.  She will follow-up if not improving in a week or so.  Follow-up: Return if symptoms worsen or fail to improve.  Libby Maw, MD

## 2017-12-15 ENCOUNTER — Ambulatory Visit: Payer: 59 | Admitting: Family Medicine

## 2018-01-02 MED FILL — AMOXICILLIN 500 MG CAPSULE: 500 | 10 days supply | Qty: 30 | Fill #0

## 2018-01-08 MED FILL — HYDROCHLOROTHIAZIDE 25 MG T: 25 | 90 days supply | Qty: 90 | Fill #0

## 2018-01-08 MED FILL — AMLODIPINE BESYLATE 5 MG TA: 5 | 90 days supply | Qty: 90 | Fill #0

## 2018-01-26 MED FILL — CITALOPRAM HBR 40 MG TABLET: 40 | 90 days supply | Qty: 90 | Fill #0

## 2018-02-23 MED FILL — PRAVASTATIN SODIUM 20 MG TA: 20 | 90 days supply | Qty: 90 | Fill #1

## 2018-02-24 MED FILL — ALPRAZolam 0.25 MG TABS: 0.25 | 90 days supply | Qty: 90 | Fill #1

## 2018-04-06 MED FILL — AMLODIPINE BESYLATE 5 MG TA: 5 | 90 days supply | Qty: 90 | Fill #1

## 2018-04-06 MED FILL — HYDROCHLOROTHIAZIDE 25 MG T: 25 | 90 days supply | Qty: 90 | Fill #1

## 2018-04-28 MED FILL — CITALOPRAM HBR 40 MG TABLET: 40 | 90 days supply | Qty: 90 | Fill #1

## 2018-05-13 ENCOUNTER — Ambulatory Visit: Payer: Self-pay | Admitting: Family Medicine

## 2018-06-02 ENCOUNTER — Other Ambulatory Visit: Payer: Self-pay | Admitting: Family Medicine

## 2018-06-02 MED FILL — PRAVASTATIN SODIUM 20 MG TA: 20 | 90 days supply | Qty: 90 | Fill #0

## 2018-06-03 ENCOUNTER — Encounter: Payer: Self-pay | Admitting: Family Medicine

## 2018-06-04 ENCOUNTER — Encounter: Payer: Self-pay | Admitting: Family Medicine

## 2018-06-11 ENCOUNTER — Encounter: Payer: Self-pay | Admitting: Family Medicine

## 2018-07-08 ENCOUNTER — Other Ambulatory Visit: Payer: Self-pay

## 2018-07-08 ENCOUNTER — Other Ambulatory Visit: Payer: Self-pay | Admitting: Family Medicine

## 2018-07-09 MED FILL — HYDROCHLOROTHIAZIDE 25 MG T: 25 | 90 days supply | Qty: 90 | Fill #0

## 2018-07-09 MED FILL — AMLODIPINE BESYLATE 5 MG TA: 5 | 90 days supply | Qty: 90 | Fill #0

## 2018-07-23 ENCOUNTER — Other Ambulatory Visit: Payer: Self-pay | Admitting: Family Medicine

## 2018-07-23 MED FILL — CITALOPRAM HBR 40 MG TABLET: 40 | 90 days supply | Qty: 90 | Fill #0

## 2018-08-24 ENCOUNTER — Other Ambulatory Visit: Payer: Self-pay | Admitting: Family Medicine

## 2018-08-24 NOTE — Telephone Encounter (Signed)
Pharm to advise pt to sched OV first/thx dmf

## 2018-08-25 ENCOUNTER — Other Ambulatory Visit: Payer: Self-pay | Admitting: Family Medicine

## 2018-08-26 MED FILL — PRAVASTATIN SODIUM 20 MG TA: 20 | 90 days supply | Qty: 90 | Fill #0

## 2018-09-01 ENCOUNTER — Ambulatory Visit: Payer: Self-pay | Admitting: Family Medicine

## 2018-09-03 ENCOUNTER — Ambulatory Visit: Payer: Self-pay | Admitting: Family Medicine

## 2018-09-07 ENCOUNTER — Telehealth: Payer: Self-pay | Admitting: Emergency Medicine

## 2018-09-07 DIAGNOSIS — Z1211 Encounter for screening for malignant neoplasm of colon: Secondary | ICD-10-CM

## 2018-09-07 NOTE — Telephone Encounter (Signed)
Copied from [place] [id number]. Topic: Referral - Request >> Sep 07, 2018  4:32 PM Regina Huber wrote: Reason for CRM: Pt is requesting a referral for colonoscopy. Pt states that she prefers a female provider but is not concerned with office or location. Would you like for referral to be placed? Please advise.   CB#[phone]

## 2018-09-07 NOTE — Addendum Note (Signed)
Addended by: Lucille Passy on: 09/07/2018 06:36 PM   Modules accepted: Orders

## 2018-09-07 NOTE — Telephone Encounter (Signed)
Referral placed.

## 2018-09-11 ENCOUNTER — Encounter: Payer: Self-pay | Admitting: Gastroenterology

## 2018-09-21 ENCOUNTER — Ambulatory Visit: Payer: Self-pay | Admitting: Family Medicine

## 2018-09-28 ENCOUNTER — Other Ambulatory Visit: Payer: Self-pay | Admitting: Family Medicine

## 2018-09-28 ENCOUNTER — Encounter: Payer: Self-pay | Admitting: Family Medicine

## 2018-09-28 ENCOUNTER — Ambulatory Visit (INDEPENDENT_AMBULATORY_CARE_PROVIDER_SITE_OTHER): Payer: No Typology Code available for payment source | Admitting: Family Medicine

## 2018-09-28 VITALS — BP 126/68 | HR 69 | Temp 98.6°F | Ht 67.0 in | Wt 204.6 lb

## 2018-09-28 DIAGNOSIS — R221 Localized swelling, mass and lump, neck: Secondary | ICD-10-CM

## 2018-09-28 DIAGNOSIS — G4700 Insomnia, unspecified: Secondary | ICD-10-CM

## 2018-09-28 DIAGNOSIS — R0683 Snoring: Secondary | ICD-10-CM | POA: Diagnosis not present

## 2018-09-28 DIAGNOSIS — Z9089 Acquired absence of other organs: Secondary | ICD-10-CM | POA: Diagnosis not present

## 2018-09-28 DIAGNOSIS — G7000 Myasthenia gravis without (acute) exacerbation: Secondary | ICD-10-CM

## 2018-09-28 LAB — COMPREHENSIVE METABOLIC PANEL
ALT: 15 U/L (ref 0–35)
AST: 12 U/L (ref 0–37)
Albumin: 4.3 g/dL (ref 3.5–5.2)
Alkaline Phosphatase: 83 U/L (ref 39–117)
BUN: 13 mg/dL (ref 6–23)
CO2: 33 mEq/L — ABNORMAL HIGH (ref 19–32)
Calcium: 9.9 mg/dL (ref 8.4–10.5)
Chloride: 100 mEq/L (ref 96–112)
Creatinine, Ser: 0.8 mg/dL (ref 0.40–1.20)
GFR: 78.54 mL/min (ref >60.00)
Glucose, Bld: 96 mg/dL (ref 70–99)
Potassium: 3.7 mEq/L (ref 3.5–5.1)
Sodium: 141 mEq/L (ref 135–145)
Total Bilirubin: 0.6 mg/dL (ref 0.2–1.2)
Total Protein: 7.3 g/dL (ref 6.0–8.3)

## 2018-09-28 LAB — TSH: TSH: 0.86 u[IU]/mL (ref 0.35–4.50)

## 2018-09-28 LAB — T4, FREE: Free T4: 0.73 ng/dL (ref 0.60–1.60)

## 2018-09-28 MED ORDER — TRAZODONE HCL 50 MG PO TABS: 25.0000 mg | ORAL_TABLET | Freq: Every evening | ORAL | 3 refills | Status: DC | PRN

## 2018-09-28 MED FILL — traZODone HCL 50 MG TABS: 50 | 30 days supply | Qty: 30 | Fill #0

## 2018-09-28 NOTE — Assessment & Plan Note (Signed)
Deteriorated-  The problem of recurrent insomnia is discussed. Avoidance of caffeine sources is strongly encouraged. Sleep hygiene issues are reviewed. Start trazodone 25-50 mg nightly for insomnia.  She will update me in a 2- 3 weeks. The patient indicates understanding of these issues and agrees with the plan.

## 2018-09-28 NOTE — Patient Instructions (Addendum)
Great to see you.  I will call you with your lab results from today and you can view them online.   We are starting trazodone 25-50 mg nightly for insomnia- Keep me updated.   We are referring for a sleep study and for a CT of your neck.

## 2018-09-28 NOTE — Progress Notes (Signed)
Subjective:   Patient ID: Regina Huber, female    DOB: 07-06-1961, 57 y.o.   MRN: [id number]  Regina Huber is a pleasant 57 y.o. year old female who presents to clinic today with Medication Refill (would like something to help sleep) and Snoring  on 09/28/2018  HPI:  Insomnia-  Has been on celexa 40 mg daily for years for depression.  Was using a couple of times a week for insomnia lats year but has not taken it in many months.  Having trouble sleeping again- falling and staying asleep.  Lately, she feels pressure and fullness in the front of her neck, especially when she lays down or leans forward. Myasthenia Gravis- remote history of thymectomy. Sometimes this too wakes her up from sleep.  No pain or difficulty swallowing but it does feel full. Sometimes the back of her neck feels uncomfortable as well.  Has tried changing head positions but this does not seem to help.  Snoring- daughter has noticed that she is snoring more than usual and sounds like she stops breathing at night.  Myasthenia Gravis- remote history of thymectomy.   Current Outpatient Medications on File Prior to Visit  Medication Sig Dispense Refill  . amLODipine (NORVASC) 5 MG tablet TAKE 1 TABLET (5 MG TOTAL) BY MOUTH DAILY. 90 tablet 0  . citalopram (CELEXA) 40 MG tablet TAKE 1 TABLET (40 MG TOTAL) BY MOUTH DAILY. 90 tablet 0  . colchicine 0.6 MG tablet Take 1 tablet (0.6 mg total) by mouth 2 (two) times daily. For 7-10 days and then as needed for attacks. 60 tablet 0  . hydrochlorothiazide (HYDRODIURIL) 25 MG tablet TAKE 1 TABLET (25 MG TOTAL) BY MOUTH DAILY. 90 tablet 0  . indomethacin (INDOCIN SR) 75 MG CR capsule Take 1 capsule (75 mg total) by mouth 2 (two) times daily with a meal. For 7-10 days and then as needed for attacks. 60 capsule 0  . pravastatin (PRAVACHOL) 20 MG tablet TAKE 1 TABLET (20 MG TOTAL) BY MOUTH DAILY. 90 tablet 0   No current facility-administered medications on file prior to visit.       Allergies  Allergen Reactions  . Penicillins Rash    - has tolerated cephalosporins    Past Medical History:  Diagnosis Date  . Depression   . Dyspnea    slight sob when lying flat  . 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 Hyperlipidemia   . Hypertension   . Myasthenia gravis ([place])    dx 2005   s/p  thymectomy 2006--  per pt no other symtpoms since  . Neck stiffness   . Palpitations   . Transfusion history    s/p hysterectomy. pt states she received 9 pints of blood  . Vaginal intraepithelial neoplasia III (VAIN III)     Past Surgical History:  Procedure Laterality Date  . ABDOMINAL HYSTERECTOMY  05-07-2000   w/  Right Ovarian Cystectomy  . CARDIOVASCULAR STRESS TEST  12-11-2016  dr Daneen Schick   normal nuclera study w/ no ischemia/  normal LV function and wall motion, nuclear stress ef 59% (BP demonstrated hypertensive response to exercise)  . [place]  . CO2 LASER APPLICATION N/A 02/13/6009   Procedure: CO2 LASER VAPORIZATION OF THE VAGINA;  Surgeon: Everitt Amber, MD;  Location: [place];  Service: Gynecology;  Laterality: N/A;  . CO2 LASER APPLICATION  N/A 09/11/2017   Procedure: CO2 LASER VAPORIZATION OF THE VAGINA;  Surgeon: Everitt Amber, MD;  Location: [facility];  Service: Gynecology;  Laterality: N/A;  . COMBINED ABDOMINOPLASTY AND LIPOSUCTION  03-12-2002  . LAPAROTOMY/  SUTURE INTRAPERITONEAL BLEEDING  05-08-2000  . LUMBAR DISC SURGERY  11-07-2010   L5 - S1  . TRANSSTERNAL THYMECTOMY  04-29-2005  . TRANSTHORACIC ECHOCARDIOGRAM  12-11-2016  dr Daneen Schick   grade 1 diastolic dysfunction, ef 18-56%/  trivial MR, PR, and TR    Family History  Problem Relation Age of Onset  . Cancer Mother 9       breast CA  . Hypertension Mother   . Diabetes Mother   . Cancer Father 22       colon CA  . Alcohol abuse Father   . Hypertension Father   .  Stroke Father   . Heart disease Brother 19       MI    Social History   Socioeconomic History  . Marital status: Single    Spouse name: Not on file  . Number of children: Not on file  . Years of education: Not on file  . Highest education level: Not on file  Occupational History  . Not on file  Social Needs  . Financial resource strain: Not on file  . Food insecurity:    Worry: Not on file    Inability: Not on file  . Transportation needs:    Medical: Not on file    Non-medical: Not on file  Tobacco Use  . Smoking status: Never Smoker  . Smokeless tobacco: Never Used  Substance and Sexual Activity  . Alcohol use: Yes    Comment: rarely  . Drug use: No  . Sexual activity: Not on file  Lifestyle  . Physical activity:    Days per week: Not on file    Minutes per session: Not on file  . Stress: Not on file  Relationships  . Social connections:    Talks on phone: Not on file    Gets together: Not on file    Attends religious service: Not on file    Active member of club or organization: Not on file    Attends meetings of clubs or organizations: Not on file    Relationship status: Not on file  . Intimate partner violence:    Fear of current or ex partner: Not on file    Emotionally abused: Not on file    Physically abused: Not on file    Forced sexual activity: Not on file  Other Topics Concern  . Not on file  Social History Narrative   Married, twin daughters born 32, son born 1988   Works at [employer] in [employer], mining.   The PMH, PSH, Social History, Family History, Medications, and allergies have been reviewed in [facility], and have been updated if relevant.   Review of Systems  Constitutional: Positive for fatigue.  HENT: Positive for voice change. Negative for congestion, dental problem and sore throat.        + neck fullness  Eyes: Negative.   Respiratory: Negative for choking and chest tightness.   Cardiovascular: Negative.   Gastrointestinal: Negative.     Endocrine: Negative.   Genitourinary: Negative.   Musculoskeletal: Negative.   Skin: Negative.   Allergic/Immunologic: Negative.   Neurological: Negative.   Hematological: Negative.   Psychiatric/Behavioral: Positive for sleep disturbance. Negative for dysphoric mood.  All other systems reviewed and are negative.  Objective:    BP 126/68 (BP Location: Right Arm, Patient Position: Sitting, Cuff Size: Normal)   Pulse 69   Temp 98.6 F (37 C) (Oral)   Ht 5\' 7"  (1.702 m)   Wt 204 lb 9.6 oz (92.8 kg)   SpO2 97%   BMI 32.04 kg/m    Physical Exam  Constitutional: She is oriented to person, place, and time.  HENT:  Head: Normocephalic and atraumatic.  Neck: Normal range of motion. Neck supple. No JVD present. Thyromegaly present.  Cardiovascular: Normal rate.  Pulmonary/Chest: Effort normal.  Musculoskeletal: Normal range of motion. She exhibits no edema.  Lymphadenopathy:    She has no cervical adenopathy.  Neurological: She is alert and oriented to person, place, and time. She displays normal reflexes. No cranial nerve deficit. Coordination normal.  Skin: Skin is warm and dry.  Psychiatric: She has a normal mood and affect. Her behavior is normal. Judgment and thought content normal.  Nursing note and vitals reviewed.         Assessment & Plan:   Insomnia, unspecified type  Snoring - Plan: Ambulatory referral to Pulmonology  Neck fullness - Plan: CT Soft Tissue Neck W Contrast, TSH, T4, free, Comprehensive metabolic panel, PTH, Intact and Calcium  History of thymectomy - Plan: CT Soft Tissue Neck W Contrast, TSH, T4, free, Comprehensive metabolic panel, PTH, Intact and Calcium No follow-ups on file.

## 2018-09-28 NOTE — Assessment & Plan Note (Signed)
New with possible apneic episodes. See below. Refer to neurology for sleep study.

## 2018-09-28 NOTE — Assessment & Plan Note (Signed)
New, persistent in a patient with new snoring as well. Given her h/o MG (remote h/o thymectomy), I do feel this warrants neurology evaluation for sleep study, maybe even EMG/CT of neck. Will also check labs today. The patient indicates understanding of these issues and agrees with the plan.

## 2018-09-30 LAB — PTH, INTACT AND CALCIUM
Calcium: 9.7 mg/dL (ref 8.6–10.4)
PTH: 64 pg/mL (ref 14–64)

## 2018-09-30 MED FILL — HYDROCHLOROTHIAZIDE 25 MG T: 25 | 90 days supply | Qty: 90 | Fill #0

## 2018-09-30 MED FILL — AMLODIPINE BESYLATE 5 MG TA: 5 | 90 days supply | Qty: 90 | Fill #0

## 2018-10-01 ENCOUNTER — Institutional Professional Consult (permissible substitution): Payer: No Typology Code available for payment source | Admitting: Neurology

## 2018-10-13 ENCOUNTER — Encounter: Payer: Self-pay | Admitting: Neurology

## 2018-10-14 ENCOUNTER — Encounter: Payer: Self-pay | Admitting: Gastroenterology

## 2018-10-14 ENCOUNTER — Encounter: Payer: Self-pay | Admitting: Family Medicine

## 2018-10-14 ENCOUNTER — Ambulatory Visit (AMBULATORY_SURGERY_CENTER): Payer: Self-pay | Admitting: *Deleted

## 2018-10-14 ENCOUNTER — Other Ambulatory Visit: Payer: Self-pay | Admitting: Family Medicine

## 2018-10-14 VITALS — Ht 67.0 in | Wt 205.0 lb

## 2018-10-14 DIAGNOSIS — Z800 Family history of malignant neoplasm of digestive organs: Secondary | ICD-10-CM

## 2018-10-14 MED ORDER — NA SULFATE-K SULFATE-MG SULF 17.5-3.13-1.6 GM/177ML PO SOLN: 1.0000 | Freq: Once | ORAL | 0 refills | Status: AC

## 2018-10-14 MED FILL — SUPREP BOWEL PREP KIT: 17.5-3.13-1 | 1 days supply | Qty: 354 | Fill #0

## 2018-10-14 NOTE — Progress Notes (Signed)
No egg or soy allergy known to patient  No issues with past sedation with any surgeries  or procedures, no intubation problems  No diet pills per patient No home 02 use per patient  No blood thinners per patient  Pt denies issues with constipation  No A fib or A flutter  EMMI video sent to pt's e mail  

## 2018-10-15 ENCOUNTER — Other Ambulatory Visit: Payer: Self-pay | Admitting: Family Medicine

## 2018-10-15 ENCOUNTER — Telehealth: Payer: Self-pay

## 2018-10-15 MED ORDER — TRAZODONE HCL 50 MG PO TABS: 25.0000 mg | ORAL_TABLET | Freq: Every evening | ORAL | 3 refills | Status: DC | PRN

## 2018-10-15 MED ORDER — FLUCONAZOLE 150 MG PO TABS: 150.0000 mg | ORAL_TABLET | Freq: Once | ORAL | 0 refills | Status: AC

## 2018-10-15 MED ORDER — CITALOPRAM HYDROBROMIDE 40 MG PO TABS: 40.0000 mg | ORAL_TABLET | Freq: Every day | ORAL | 1 refills | Status: DC

## 2018-10-15 MED FILL — FLUCONAZOLE 150 MG TABS: 150 | 1 days supply | Qty: 1 | Fill #0

## 2018-10-15 MED FILL — CITALOPRAM HBR 40 MG TABLET: 40 | 90 days supply | Qty: 90 | Fill #0

## 2018-10-15 NOTE — Telephone Encounter (Signed)
Copied from [place] [id number]. Topic: General - Other >> Oct 15, 2018 10:57 AM Ivar Drape wrote: Reason for CRM:  Patient has had a yeast infection for 3 days now but the over the counter meds are not working.  She would like something called in for her at the [place] on [facility].

## 2018-10-15 NOTE — Addendum Note (Signed)
Addended by: Lucille Passy on: 10/15/2018 02:38 PM   Modules accepted: Orders

## 2018-10-15 NOTE — Telephone Encounter (Signed)
Diflucan eRx sent to pharmacy on file.

## 2018-10-20 ENCOUNTER — Encounter: Payer: Self-pay | Admitting: Gastroenterology

## 2018-10-23 MED FILL — traZODone HCL 50 MG TABS: 50 | 90 days supply | Qty: 90 | Fill #0

## 2018-10-26 ENCOUNTER — Encounter: Payer: Self-pay | Admitting: Gastroenterology

## 2018-11-17 ENCOUNTER — Encounter: Payer: Self-pay | Admitting: Emergency Medicine

## 2018-11-17 ENCOUNTER — Emergency Department
Admission: EM | Admit: 2018-11-17 | Discharge: 2018-11-17 | Disposition: A | Discharge status: Home / Self Care | Payer: No Typology Code available for payment source | Source: Home / Self Care | Attending: Family Medicine | Admitting: Family Medicine

## 2018-11-17 ENCOUNTER — Other Ambulatory Visit: Payer: Self-pay

## 2018-11-17 DIAGNOSIS — B9789 Other viral agents as the cause of diseases classified elsewhere: Secondary | ICD-10-CM

## 2018-11-17 DIAGNOSIS — M940 Chondrocostal junction syndrome [Tietze]: Secondary | ICD-10-CM

## 2018-11-17 DIAGNOSIS — J069 Acute upper respiratory infection, unspecified: Secondary | ICD-10-CM

## 2018-11-17 MED ORDER — METHYLPREDNISOLONE ACETATE 80 MG/ML IJ SUSP
80.0000 mg | Freq: Once | INTRAMUSCULAR | Status: AC
  Administered 2018-11-17: 80 mg via INTRAMUSCULAR

## 2018-11-17 MED ORDER — AZITHROMYCIN 250 MG PO TABS: ORAL_TABLET | ORAL | 0 refills | Status: DC

## 2018-11-17 MED FILL — AZITHROMYCIN 250 MG TABLET: 250 | 5 days supply | Qty: 6 | Fill #0

## 2018-11-17 NOTE — Discharge Instructions (Addendum)
Take plain guaifenesin (1200mg  extended release tabs such as Mucinex) twice daily, with plenty of water, for cough and congestion.  Continue Pseudoephedrine (30mg , one or two every 4 to 6 hours) for sinus congestion.  Get adequate rest.   May use Afrin nasal spray (or generic oxymetazoline) each morning for about 5 days and then discontinue.  Also recommend using saline nasal spray several times daily and saline nasal irrigation (AYR is a common brand).  Try warm salt water gargles for sore throat.  Stop all antihistamines for now, and other non-prescription cough/cold preparations. May take Delsym Cough Suppressant at bedtime for nighttime cough.  Put ice on the painful area of sternum: Put ice in a plastic bag. Place a towel between your skin and the bag. Leave the ice on for 20 minutes, 2-3 times a day.

## 2018-11-17 NOTE — ED Triage Notes (Signed)
Productive cough with dark yellow mucus x 1 week, hoarseness

## 2018-11-17 NOTE — ED Provider Notes (Signed)
[facility]    CSN: [id number] Arrival date & time: 11/17/18  1259     History   Chief Complaint Chief Complaint  Patient presents with  . Cough    HPI TODD ARGABRIGHT is a 57 y.o. female.   Patient complains of one week history of typical cold-like symptoms developing over several days, including mild sore throat, sinus congestion, fatigue, and cough.  She feels tightness in her anterior chest.  She has had persistent sweats at night.  The history is provided by the patient.    Past Medical History:  Diagnosis Date  . Allergy    mild - no meds   . Anemia    with twin pregnancy   . Depression   . Dyspnea    slight sob when lying flat  . 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 Hyperlipidemia   . Hypertension   . Myasthenia gravis ([place])    dx 2005   s/p  thymectomy 2006--  per pt no other symtpoms since  . Neck stiffness   . Palpitations   . Transfusion history    s/p hysterectomy. pt states she received 9 pints of blood  . Vaginal intraepithelial neoplasia III (VAIN III)     Patient Active Problem List   Diagnosis Date Noted  . Neck fullness 09/28/2018  . History of thymectomy 09/28/2018  . Snoring 12/02/2016  . 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 Palpitations 12/22/2015  . Nonspecific abnormal electrocardiogram (ECG) (EKG) 12/22/2015  . Peripheral edema 12/22/2015  . Dyspnea on exertion 12/22/2015  . Insomnia 07/18/2015  . Fatigue 07/18/2015  . MG (myasthenia gravis) ([place]) 10/31/2014  . Hypertension   . Hyperlipidemia   . Depression     Past Surgical History:  Procedure Laterality Date  . ABDOMINAL HYSTERECTOMY  05-07-2000   w/  Right Ovarian Cystectomy  . CARDIOVASCULAR STRESS TEST  12-11-2016  dr Daneen Schick   normal nuclera study w/ no ischemia/  normal LV function and wall motion, nuclear stress ef 59% (BP demonstrated hypertensive  response to exercise)  . [place]  . CO2 LASER APPLICATION N/A 2/94/7654   Procedure: CO2 LASER VAPORIZATION OF THE VAGINA;  Surgeon: Everitt Amber, MD;  Location: [facility];  Service: Gynecology;  Laterality: N/A;  . CO2 LASER APPLICATION N/A 6/50/3546   Procedure: CO2 LASER VAPORIZATION OF THE VAGINA;  Surgeon: Everitt Amber, MD;  Location: [facility];  Service: Gynecology;  Laterality: N/A;  . COMBINED ABDOMINOPLASTY AND LIPOSUCTION  03-12-2002  . LAPAROTOMY/  SUTURE INTRAPERITONEAL BLEEDING  05-08-2000  . LUMBAR DISC SURGERY  11-07-2010   L5 - S1  . TRANSSTERNAL THYMECTOMY  04-29-2005  . TRANSTHORACIC ECHOCARDIOGRAM  12-11-2016  dr Daneen Schick   grade 1 diastolic dysfunction, ef 56-81%/  trivial MR, PR, and TR    OB History   None      Home Medications    Prior to Admission medications   Medication Sig Start Date End Date Taking? Authorizing Provider  amLODipine (NORVASC) 5 MG tablet TAKE 1 TABLET BY MOUTH DAILY. 09/30/18   Lucille Passy, MD  azithromycin (ZITHROMAX Z-PAK) 250 MG tablet Take 2 tabs today; then begin one tab once daily for 4 more days. 11/17/18   Kandra Nicolas, MD  citalopram (CELEXA) 40 MG tablet Take 1 tablet (40 mg total) by  mouth daily. 10/15/18   Lucille Passy, MD  hydrochlorothiazide (HYDRODIURIL) 25 MG tablet TAKE 1 TABLET BY MOUTH DAILY. 09/30/18   Lucille Passy, MD  pravastatin (PRAVACHOL) 20 MG tablet TAKE 1 TABLET (20 MG TOTAL) BY MOUTH DAILY. 08/26/18   Lucille Passy, MD  traZODone (DESYREL) 50 MG tablet Take 0.5-1 tablets (25-50 mg total) by mouth at bedtime as needed for sleep. 10/15/18   Lucille Passy, MD    Family History Family History  Problem Relation Age of Onset  . Cancer Mother 100       breast CA  . Hypertension Mother   . Diabetes Mother   . Breast cancer Mother   . Cancer Father 55       colon CA  . Alcohol abuse Father   . Hypertension Father   . Stroke Father   . Colon  cancer Father 4  . Heart disease Brother 29       MI  . Colon polyps Brother   . Colon polyps Sister   . Colon polyps Sister   . Colon polyps Brother   . Esophageal cancer Neg Hx   . Rectal cancer Neg Hx   . Stomach cancer Neg Hx     Social History Social History   Tobacco Use  . Smoking status: Never Smoker  . Smokeless tobacco: Never Used  Substance Use Topics  . Alcohol use: Yes    Comment: rarely  . Drug use: No     Allergies   Penicillins   Review of Systems Review of Systems + sore throat + hoarse + cough No pleuritic pain, but feels tight in anterior chest No wheezing + nasal congestion + post-nasal drainage No sinus pain/pressure No itchy/red eyes No earache No hemoptysis No SOB No fever, + chills/sweats No nausea No vomiting No abdominal pain No diarrhea No urinary symptoms No skin rash + fatigue No myalgias No headache Used OTC meds without relief   Physical Exam Triage Vital Signs ED Triage Vitals  Enc Vitals Group     BP 11/17/18 1312 136/77     Pulse Rate 11/17/18 1312 80     Resp --      Temp 11/17/18 1312 98.2 F (36.8 C)     Temp Source 11/17/18 1312 Oral     SpO2 11/17/18 1312 99 %     Weight 11/17/18 1314 192 lb (87.1 kg)     Height 11/17/18 1314 5\' 7"  (1.702 m)     Head Circumference --      Peak Flow --      Pain Score 11/17/18 1313 2     Pain Loc --      Pain Edu? --      Excl. in [place]? --    No data found.  Updated Vital Signs BP 136/77 (BP Location: Right Arm)   Pulse 80   Temp 98.2 F (36.8 C) (Oral)   Ht 5\' 7"  (1.702 m)   Wt 87.1 kg   SpO2 99%   BMI 30.07 kg/m   Visual Acuity Right Eye Distance:   Left Eye Distance:   Bilateral Distance:    Right Eye Near:   Left Eye Near:    Bilateral Near:     Physical Exam Nursing notes and Vital Signs reviewed. Appearance:  Patient appears stated age, and in no acute distress Eyes:  Pupils are equal, round, and reactive to light and accomodation.   Extraocular movement is intact.  Conjunctivae are  not inflamed  Ears:  Canals normal.  Tympanic membranes normal.  Nose:  Mildly congested turbinates.  No sinus tenderness. Pharynx:  Normal Neck:  Supple.  Enlarged posterior/lateral nodes are palpated bilaterally, tender to palpation on the left.   Lungs:  Clear to auscultation.  Breath sounds are equal.  Moving air well. Chest:  Distinct tenderness to palpation over the mid-sternum.  Heart:  Regular rate and rhythm without murmurs, rubs, or gallops.  Abdomen:  Nontender without masses or hepatosplenomegaly.  Bowel sounds are present.  No CVA or flank tenderness.  Extremities:  No edema.  Skin:  No rash present.    UC Treatments / Results  Labs (all labs ordered are listed, but only abnormal results are displayed) Labs Reviewed - No data to display  EKG None  Radiology No results found.  Procedures Procedures (including critical care time)  Medications Ordered in UC Medications  methylPREDNISolone acetate (DEPO-MEDROL) injection 80 mg (has no administration in time range)    Initial Impression / Assessment and Plan / UC Course  I have reviewed the triage vital signs and the nursing notes.  Pertinent labs & imaging results that were available during my care of the patient were reviewed by me and considered in my medical decision making (see chart for details).    Administered Depo Medrol 80mg  IM. ?developing bacterial bronchitis.  Begin empiric Z-pak (patient notes that she has had no adverse effects in the past). Followup with Family Doctor if not improved in about 10 days.   Final Clinical Impressions(s) / UC Diagnoses   Final diagnoses:  Viral URI with cough  Costochondritis     Discharge Instructions     Take plain guaifenesin (1200mg  extended release tabs such as Mucinex) twice daily, with plenty of water, for cough and congestion.  Continue Pseudoephedrine (30mg , one or two every 4 to 6 hours) for sinus  congestion.  Get adequate rest.   May use Afrin nasal spray (or generic oxymetazoline) each morning for about 5 days and then discontinue.  Also recommend using saline nasal spray several times daily and saline nasal irrigation (AYR is a common brand).  Try warm salt water gargles for sore throat.  Stop all antihistamines for now, and other non-prescription cough/cold preparations. May take Delsym Cough Suppressant at bedtime for nighttime cough.   Put ice on the painful area of sternum: ? Put ice in a plastic bag. ? Place a towel between your skin and the bag. ? Leave the ice on for 20 minutes, 2-3 times a day.      ED Prescriptions    Medication Sig Dispense Auth. Provider   azithromycin (ZITHROMAX Z-PAK) 250 MG tablet Take 2 tabs today; then begin one tab once daily for 4 more days. 6 tablet Kandra Nicolas, MD        Kandra Nicolas, MD 11/19/18 [phone]

## 2018-11-19 ENCOUNTER — Institutional Professional Consult (permissible substitution): Payer: No Typology Code available for payment source | Admitting: Neurology

## 2018-11-19 ENCOUNTER — Encounter

## 2018-11-23 ENCOUNTER — Encounter: Payer: Self-pay | Admitting: Gastroenterology

## 2018-11-23 ENCOUNTER — Other Ambulatory Visit: Payer: Self-pay | Admitting: Family Medicine

## 2018-11-25 ENCOUNTER — Encounter

## 2018-11-25 ENCOUNTER — Institutional Professional Consult (permissible substitution): Payer: No Typology Code available for payment source | Admitting: Neurology

## 2018-12-03 MED FILL — PRAVASTATIN SODIUM 20 MG TA: 20 | 90 days supply | Qty: 90 | Fill #0

## 2018-12-28 ENCOUNTER — Encounter: Payer: Self-pay | Admitting: Family Medicine

## 2018-12-28 ENCOUNTER — Ambulatory Visit (INDEPENDENT_AMBULATORY_CARE_PROVIDER_SITE_OTHER): Payer: No Typology Code available for payment source | Admitting: Family Medicine

## 2018-12-28 ENCOUNTER — Ambulatory Visit (INDEPENDENT_AMBULATORY_CARE_PROVIDER_SITE_OTHER): Payer: No Typology Code available for payment source

## 2018-12-28 VITALS — BP 126/76 | HR 64 | Temp 98.0°F | Ht 67.0 in

## 2018-12-28 DIAGNOSIS — J209 Acute bronchitis, unspecified: Secondary | ICD-10-CM | POA: Diagnosis not present

## 2018-12-28 DIAGNOSIS — R062 Wheezing: Secondary | ICD-10-CM | POA: Diagnosis not present

## 2018-12-28 DIAGNOSIS — J4531 Mild persistent asthma with (acute) exacerbation: Secondary | ICD-10-CM

## 2018-12-28 DIAGNOSIS — J45909 Unspecified asthma, uncomplicated: Secondary | ICD-10-CM | POA: Insufficient documentation

## 2018-12-28 MED ORDER — CLARITHROMYCIN ER 500 MG PO TB24: 1000.0000 mg | ORAL_TABLET | Freq: Every day | ORAL | 0 refills | Status: AC

## 2018-12-28 MED ORDER — METHYLPREDNISOLONE SODIUM SUCC 125 MG IJ SOLR
125.0000 mg | Freq: Once | INTRAMUSCULAR | Status: AC
  Administered 2018-12-28: 125 mg via INTRAMUSCULAR

## 2018-12-28 MED ORDER — PREDNISONE 10 MG (21) PO TBPK: ORAL_TABLET | ORAL | 0 refills | Status: DC

## 2018-12-28 MED FILL — CLARITHROMYCIN ER 500 MG TA: 500 | 10 days supply | Qty: 20 | Fill #0

## 2018-12-28 MED FILL — predniSONE 10 MG TABS: 10 | 6 days supply | Qty: 21 | Fill #0

## 2018-12-28 NOTE — Progress Notes (Signed)
Established Patient Office Visit  Subjective:  Patient ID: Regina Huber, female    DOB: 1961-12-13  Age: 58 y.o. MRN: [id number]  CC:  Chief Complaint  Patient presents with  . Cough    x 3-4 works, seen at urgent care & given z pak. no improvement since. non productive    HPI [name] presents for for persistent cough that is lasted over a month now.  There is now significant malaise.  She has been tight in her chest with wheezing.  There is been no fever but she has felt hot and cold spells.  Her cough is been nonproductive.  She has no smoking history.  She denies a history of asthma.  Seen in urgent care back on 3 December and treated with Zithromax and an injection.  She felt better for 4 days and then her symptoms mostly dry.  She is accompanied by her sister today.  Past Medical History:  Diagnosis Date  . Allergy    mild - no meds   . Anemia    with twin pregnancy   . Depression   . Dyspnea    slight sob when lying flat  . 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 Hyperlipidemia   . Hypertension   . Myasthenia gravis ([place])    dx 2005   s/p  thymectomy 2006--  per pt no other symtpoms since  . Neck stiffness   . Palpitations   . Transfusion history    s/p hysterectomy. pt states she received 9 pints of blood  . Vaginal intraepithelial neoplasia III (VAIN III)     Past Surgical History:  Procedure Laterality Date  . ABDOMINAL HYSTERECTOMY  05-07-2000   w/  Right Ovarian Cystectomy  . CARDIOVASCULAR STRESS TEST  12-11-2016  dr Daneen Schick   normal nuclera study w/ no ischemia/  normal LV function and wall motion, nuclear stress ef 59% (BP demonstrated hypertensive response to exercise)  . [place]  . CO2 LASER APPLICATION N/A 5/70/1779   Procedure: CO2 LASER VAPORIZATION OF THE VAGINA;  Surgeon: Everitt Amber, MD;  Location: [place];  Service: Gynecology;   Laterality: N/A;  . CO2 LASER APPLICATION N/A 3/90/3009   Procedure: CO2 LASER VAPORIZATION OF THE VAGINA;  Surgeon: Everitt Amber, MD;  Location: [place];  Service: Gynecology;  Laterality: N/A;  . COMBINED ABDOMINOPLASTY AND LIPOSUCTION  03-12-2002  . LAPAROTOMY/  SUTURE INTRAPERITONEAL BLEEDING  05-08-2000  . LUMBAR DISC SURGERY  11-07-2010   L5 - S1  . TRANSSTERNAL THYMECTOMY  04-29-2005  . TRANSTHORACIC ECHOCARDIOGRAM  12-11-2016  dr Daneen Schick   grade 1 diastolic dysfunction, ef 23-30%/  trivial MR, PR, and TR    Family History  Problem Relation Age of Onset  . Cancer Mother 4       breast CA  . Hypertension Mother   . Diabetes Mother   . Breast cancer Mother   . Cancer Father 72       colon CA  . Alcohol abuse Father   . Hypertension Father   . Stroke Father   . Colon cancer Father 45  . Heart disease Brother 44       MI  . Colon polyps Brother   . Colon polyps Sister   . Colon polyps Sister   . Colon polyps Brother   . Esophageal  cancer Neg Hx   . Rectal cancer Neg Hx   . Stomach cancer Neg Hx     Social History   Socioeconomic History  . Marital status: Single    Spouse name: Not on file  . Number of children: Not on file  . Years of education: Not on file  . Highest education level: Not on file  Occupational History  . Not on file  Social Needs  . Financial resource strain: Not on file  . Food insecurity:    Worry: Not on file    Inability: Not on file  . Transportation needs:    Medical: Not on file    Non-medical: Not on file  Tobacco Use  . Smoking status: Never Smoker  . Smokeless tobacco: Never Used  Substance and Sexual Activity  . Alcohol use: Yes    Comment: rarely  . Drug use: No  . Sexual activity: Not on file  Lifestyle  . Physical activity:    Days per week: Not on file    Minutes per session: Not on file  . Stress: Not on file  Relationships  . Social connections:    Talks on phone: Not on file    Gets  together: Not on file    Attends religious service: Not on file    Active member of club or organization: Not on file    Attends meetings of clubs or organizations: Not on file    Relationship status: Not on file  . Intimate partner violence:    Fear of current or ex partner: Not on file    Emotionally abused: Not on file    Physically abused: Not on file    Forced sexual activity: Not on file  Other Topics Concern  . Not on file  Social History Narrative   Married, twin daughters born 52, son born 1988   Works at [employer] in [place] [place].    Outpatient Medications Prior to Visit  Medication Sig Dispense Refill  . amLODipine (NORVASC) 5 MG tablet TAKE 1 TABLET BY MOUTH DAILY. 90 tablet 0  . citalopram (CELEXA) 40 MG tablet Take 1 tablet (40 mg total) by mouth daily. 90 tablet 1  . hydrochlorothiazide (HYDRODIURIL) 25 MG tablet TAKE 1 TABLET BY MOUTH DAILY. 90 tablet 0  . pravastatin (PRAVACHOL) 20 MG tablet TAKE 1 TABLET BY MOUTH DAILY. 90 tablet 0  . traZODone (DESYREL) 50 MG tablet Take 0.5-1 tablets (25-50 mg total) by mouth at bedtime as needed for sleep. 90 tablet 3  . azithromycin (ZITHROMAX Z-PAK) 250 MG tablet Take 2 tabs today; then begin one tab once daily for 4 more days. 6 tablet 0   No facility-administered medications prior to visit.     Allergies  Allergen Reactions  . Penicillins Rash    - has tolerated cephalosporins    ROS Review of Systems  Constitutional: Negative for chills, diaphoresis, fatigue, fever and unexpected weight change.  HENT: Negative.   Eyes: Negative for photophobia and visual disturbance.  Respiratory: Positive for cough and wheezing. Negative for shortness of breath.   Cardiovascular: Negative.   Gastrointestinal: Negative.   Genitourinary: Negative.   Musculoskeletal: Negative for arthralgias and myalgias.  Skin: Negative for pallor and rash.  Neurological: Negative for light-headedness and headaches.  Hematological: Does not bruise/bleed  easily.  Psychiatric/Behavioral: Negative.       Objective:    Physical Exam  Constitutional: She is oriented to person, place, and time. She appears well-developed and well-nourished. No distress.  HENT:  Head: Normocephalic and atraumatic.  Right Ear: External ear normal.  Left Ear: External ear normal.  Mouth/Throat: Oropharynx is clear and moist. No oropharyngeal exudate.  Eyes: Pupils are equal, round, and reactive to light. Conjunctivae are normal. Right eye exhibits no discharge. Left eye exhibits no discharge. No scleral icterus.  Neck: Neck supple. No JVD present. No tracheal deviation present. No thyromegaly present.  Cardiovascular: Normal rate, regular rhythm and normal heart sounds.  Pulmonary/Chest: Effort normal. No stridor. She has decreased breath sounds. She has no wheezes. She has no rhonchi. She has no rales.  Abdominal: Bowel sounds are normal.  Lymphadenopathy:    She has no cervical adenopathy.  Neurological: She is oriented to person, place, and time.  Skin: Skin is warm and dry. She is not diaphoretic.  Psychiatric: She has a normal mood and affect. Her behavior is normal.    BP 126/76   Pulse 64   Temp 98 F (36.7 C) (Oral)   Ht 5\' 7"  (1.702 m)   SpO2 95%   BMI 30.07 kg/m  Wt Readings from Last 3 Encounters:  11/17/18 192 lb (87.1 kg)  10/14/18 205 lb (93 kg)  09/28/18 204 lb 9.6 oz (92.8 kg)   BP Readings from Last 3 Encounters:  12/28/18 126/76  11/17/18 136/77  09/28/18 126/68   Guideline developer:  UpToDate (see UpToDate for funding source) Date Released: June 2014  Health Maintenance Due  Topic Date Due  . Hepatitis C Screening  01/01/61  . HIV Screening  08/05/1976  . COLONOSCOPY  08/06/2011    There are no preventive care reminders to display for this patient.  Lab Results  Component Value Date   TSH 0.86 09/28/2018   Lab Results  Component Value Date   WBC 9.2 12/12/2017   HGB 14.5 12/12/2017   HCT 45.3 12/12/2017    MCV 86.4 12/12/2017   PLT 344.0 12/12/2017   Lab Results  Component Value Date   NA 141 09/28/2018   K 3.7 09/28/2018   CO2 33 (H) 09/28/2018   GLUCOSE 96 09/28/2018   BUN 13 09/28/2018   CREATININE 0.80 09/28/2018   BILITOT 0.6 09/28/2018   ALKPHOS 83 09/28/2018   AST 12 09/28/2018   ALT 15 09/28/2018   PROT 7.3 09/28/2018   ALBUMIN 4.3 09/28/2018   CALCIUM 9.9 09/28/2018   CALCIUM 9.7 09/28/2018   GFR 78.54 09/28/2018   Lab Results  Component Value Date   CHOL 194 05/01/2017   Lab Results  Component Value Date   HDL 47.20 05/01/2017   Lab Results  Component Value Date   LDLCALC 110 (H) 05/01/2017   Lab Results  Component Value Date   TRIG 183.0 (H) 05/01/2017   Lab Results  Component Value Date   CHOLHDL 4 05/01/2017   No results found for: HGBA1C    Assessment & Plan:   Problem List Items Addressed This Visit      Respiratory   Asthmatic bronchitis - Primary   Relevant Medications   methylPREDNISolone sodium succinate (SOLU-MEDROL) 125 mg/2 mL injection 125 mg (Completed) (Start on 12/28/2018  3:45 PM)   clarithromycin (BIAXIN XL) 500 MG 24 hr tablet   predniSONE (STERAPRED UNI-PAK 21 TAB) 10 MG (21) TBPK tablet   Other Relevant Orders   DG Chest 2 View      Meds ordered this encounter  Medications  . methylPREDNISolone sodium succinate (SOLU-MEDROL) 125 mg/2 mL injection 125 mg  . clarithromycin (BIAXIN XL) 500 MG 24  hr tablet    Sig: Take 2 tablets (1,000 mg total) by mouth daily for 10 days.    Dispense:  20 tablet    Refill:  0  . predniSONE (STERAPRED UNI-PAK 21 TAB) 10 MG (21) TBPK tablet    Sig: Take 6 today, 5 tomorrow, 4 the next day and then 3, 2, 1 and stop    Dispense:  21 tablet    Refill:  0    Follow-up: Return if symptoms worsen or fail to improve.

## 2018-12-29 ENCOUNTER — Other Ambulatory Visit: Payer: Self-pay | Admitting: Family Medicine

## 2018-12-29 MED FILL — CITALOPRAM HBR 40 MG TABLET: 40 | 90 days supply | Qty: 90 | Fill #1

## 2018-12-29 MED FILL — traZODone HCL 50 MG TABS: 50 | 90 days supply | Qty: 90 | Fill #1

## 2018-12-31 MED FILL — AMLODIPINE BESYLATE 5 MG TA: 5 | 90 days supply | Qty: 90 | Fill #0

## 2018-12-31 MED FILL — HYDROCHLOROTHIAZIDE 25 MG T: 25 | 90 days supply | Qty: 90 | Fill #0

## 2019-01-06 ENCOUNTER — Encounter: Payer: Self-pay | Admitting: Family Medicine

## 2019-01-07 ENCOUNTER — Encounter: Payer: Self-pay | Admitting: Family Medicine

## 2019-01-08 ENCOUNTER — Other Ambulatory Visit: Payer: Self-pay

## 2019-01-08 MED ORDER — NYSTATIN 100000 UNIT/ML MT SUSP: 5.0000 mL | Freq: Four times a day (QID) | OROMUCOSAL | 0 refills | Status: DC

## 2019-02-04 ENCOUNTER — Other Ambulatory Visit: Payer: Self-pay | Admitting: Family Medicine

## 2019-02-04 ENCOUNTER — Encounter: Payer: Self-pay | Admitting: Family Medicine

## 2019-02-04 MED ORDER — FLUCONAZOLE 150 MG PO TABS: 150.0000 mg | ORAL_TABLET | Freq: Once | ORAL | 0 refills | Status: AC

## 2019-02-04 MED FILL — FLUCONAZOLE 150 MG TABS: 150 | 1 days supply | Qty: 1 | Fill #0

## 2019-02-19 ENCOUNTER — Other Ambulatory Visit: Payer: Self-pay | Admitting: Family Medicine

## 2019-02-22 ENCOUNTER — Telehealth: Payer: Self-pay | Admitting: Family Medicine

## 2019-02-22 NOTE — Telephone Encounter (Signed)
Copied from [place] [phone] [phone]. Topic: Quick Communication - Rx Refill/Question >> Feb 22, 2019  4:21 PM Margot Ables wrote: Medication: pravastatin (PRAVACHOL) 20 MG tablet - pharmacy called needing a new RX with frequency of use on medication  Preferred Pharmacy (with phone number or street name): [place], [place] - [address]. [phone] (Phone) [phone] (Fax)

## 2019-02-23 MED ORDER — PRAVASTATIN SODIUM 20 MG PO TABS: 20.0000 mg | ORAL_TABLET | Freq: Every day | ORAL | 1 refills | Status: DC

## 2019-02-23 NOTE — Telephone Encounter (Signed)
Rx re-sent with correct instructions.

## 2019-02-24 MED FILL — PRAVASTATIN SODIUM 20 MG TA: 20 | 90 days supply | Qty: 90 | Fill #0

## 2019-03-04 ENCOUNTER — Encounter: Payer: Self-pay | Admitting: Family Medicine

## 2019-03-04 ENCOUNTER — Other Ambulatory Visit: Payer: Self-pay

## 2019-03-04 NOTE — Telephone Encounter (Signed)
TA-Pt sent a message requesting Alprazolam/looks like it was historically prescribed as 0.25mg  1qhs prn/plz see pt request below/thx dmf  Hey Dr. Deborra Medina - Will you please call me in a refill for Xanax? I had a prescription, but it was been a long time ago. Really need something now to help me relax. Thank you

## 2019-03-05 ENCOUNTER — Other Ambulatory Visit: Payer: Self-pay | Admitting: Family Medicine

## 2019-03-05 MED ORDER — ALPRAZOLAM 0.25 MG PO TABS: 0.2500 mg | ORAL_TABLET | Freq: Every evening | ORAL | 0 refills | Status: DC | PRN

## 2019-03-05 MED FILL — ALPRAZolam 0.25 MG TABS: 0.25 | 30 days supply | Qty: 30 | Fill #0

## 2019-03-05 NOTE — Telephone Encounter (Signed)
TA-Per [place] PMP pt is compliant without red flags/Rx entered and pending/thx dmf

## 2019-03-05 NOTE — Telephone Encounter (Signed)
Can you enter rx as refill request and check PMP for me?  Thanks so much!  Remote epic now SUPER SLOW.

## 2019-03-05 NOTE — Addendum Note (Signed)
Addended by: Marrion Coy on: 03/05/2019 11:55 AM   Modules accepted: Orders

## 2019-03-12 IMAGING — DX DG CHEST 2V
2 series · 2 of 2 positions shown · non-contrast
Comparison: 05/17/2014

CLINICAL DATA: Nonproductive cough and shortness of breath for 4
weeks. Asthmatic bronchitis with acute exacerbation. Myasthenia
gravis.

EXAM:
CHEST - 2 VIEW

[chest pa]
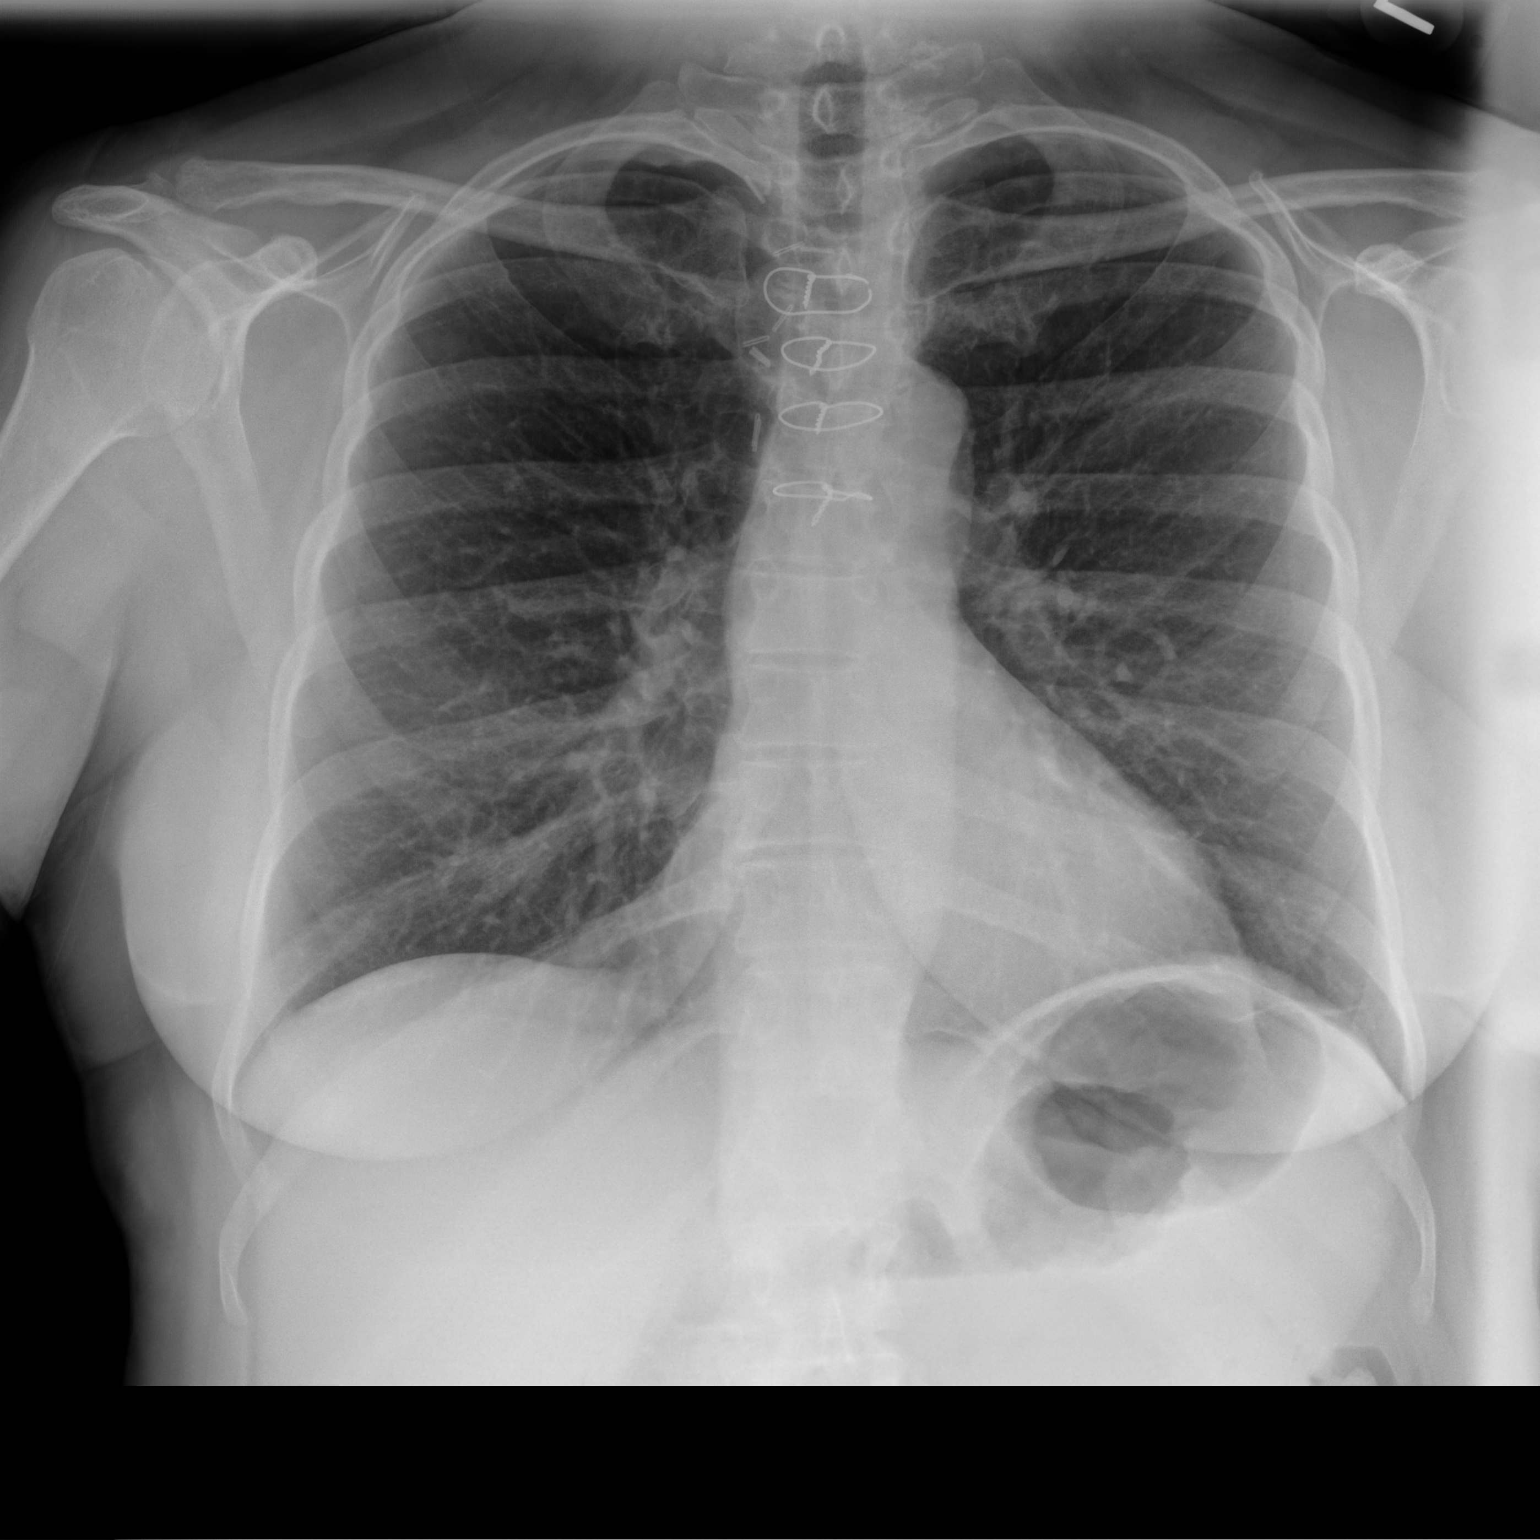

[chest lat]
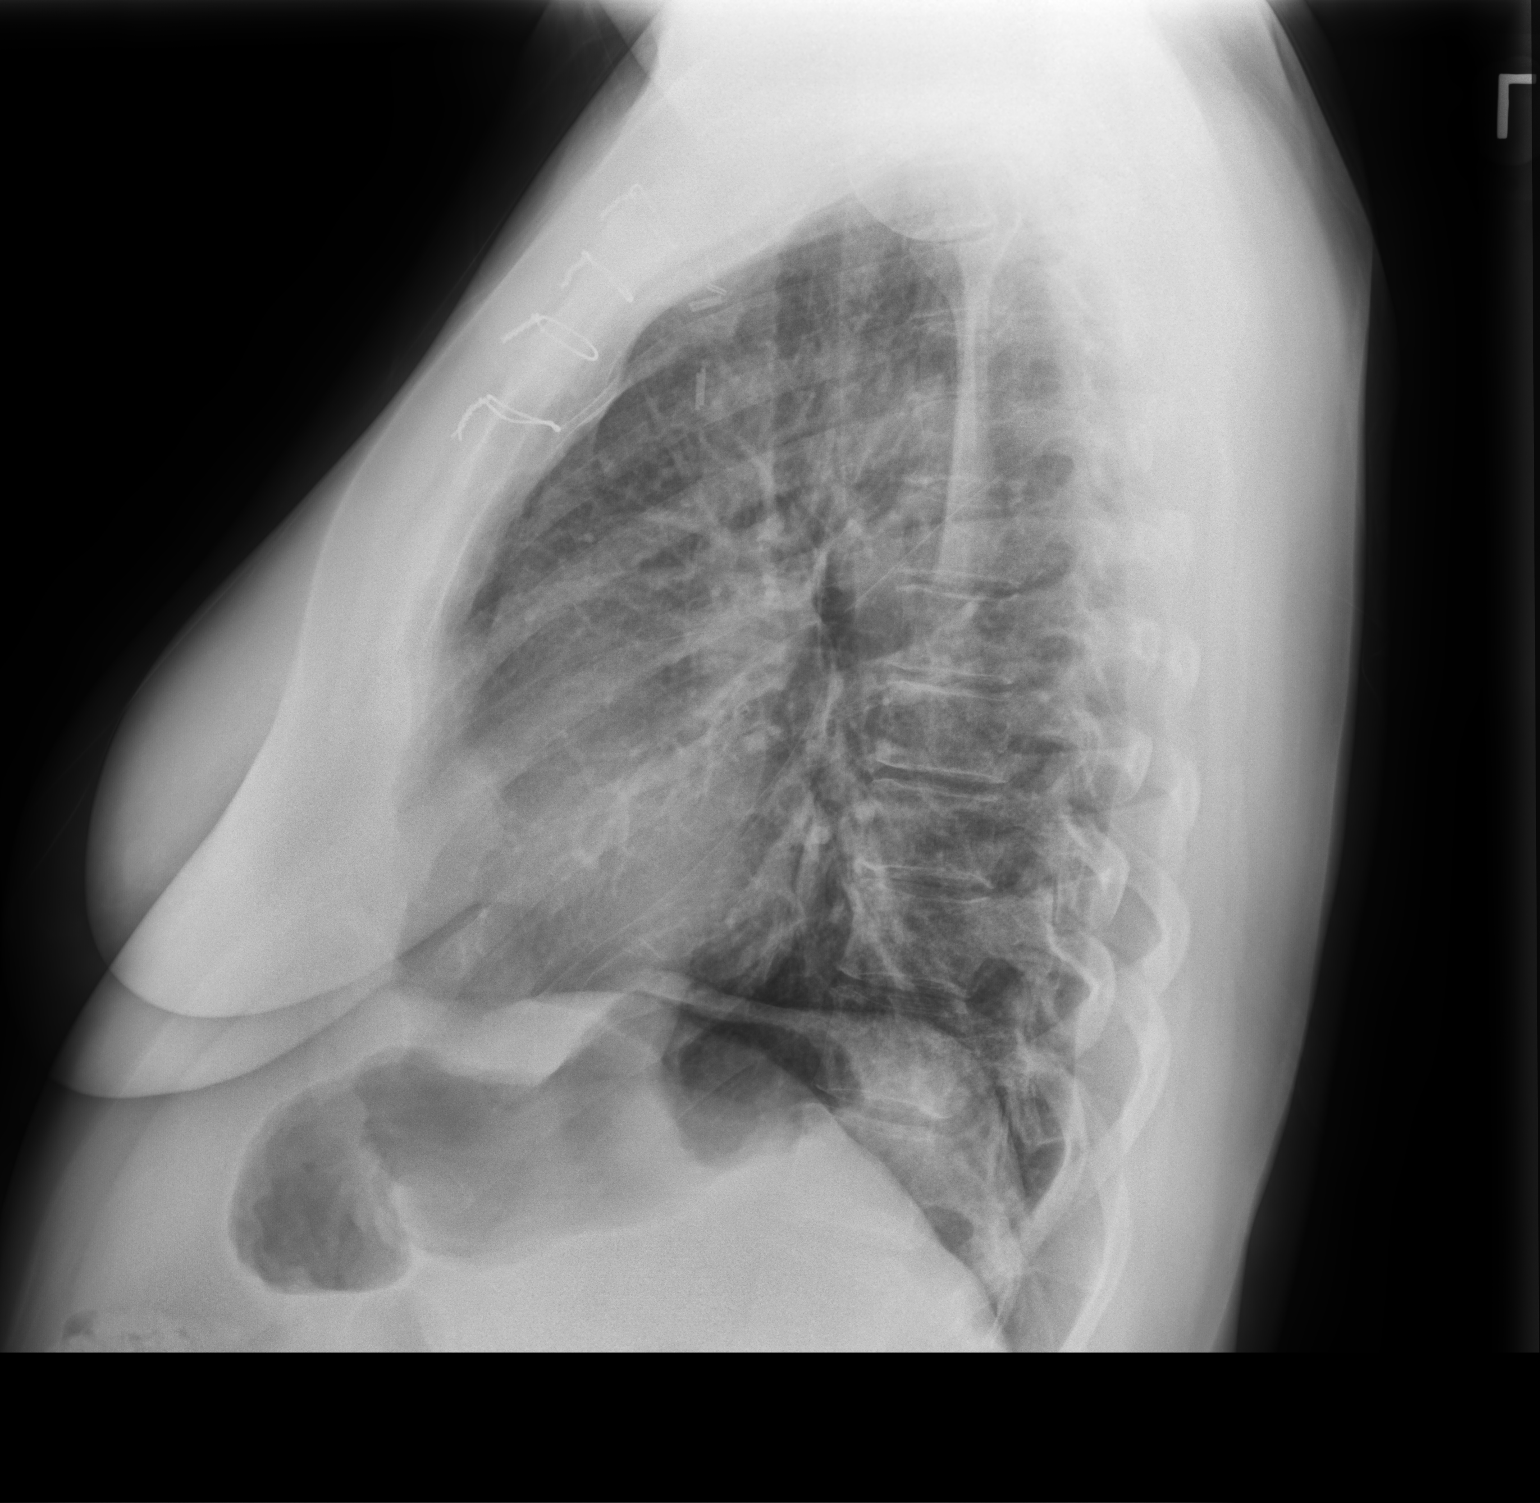

[2 of 2 positions shown; findings below may reference images not displayed]

FINDINGS: The heart size and mediastinal contours are within normal limits.
Prior median sternotomy again noted. Both lungs are clear. The
visualized skeletal structures are unremarkable.
IMPRESSION: No active cardiopulmonary disease.

## 2019-03-23 MED FILL — traZODone HCL 50 MG TABS: 50 | 90 days supply | Qty: 90 | Fill #0

## 2019-03-24 ENCOUNTER — Other Ambulatory Visit: Payer: Self-pay | Admitting: Family Medicine

## 2019-03-24 MED FILL — HYDROCHLOROTHIAZIDE 25 MG T: 25 | 90 days supply | Qty: 90 | Fill #0

## 2019-03-25 MED FILL — AMLODIPINE BESYLATE 5 MG TA: 5 | 90 days supply | Qty: 90 | Fill #0

## 2019-03-25 MED FILL — CITALOPRAM HBR 40 MG TABLET: 40 | 90 days supply | Qty: 90 | Fill #0

## 2019-04-22 ENCOUNTER — Telehealth: Payer: Self-pay

## 2019-04-22 NOTE — Telephone Encounter (Signed)
Copied from [place] [phone]. Topic: General - Other >> Apr 22, 2019 10:29 AM Leward Quan A wrote: Reason for CRM: Patient called to speak to [facility] asking for a call back to set up an appointment with Dr Deborra Medina for her daughter Duwayne Heck for today. Ph# [phone]

## 2019-05-02 ENCOUNTER — Encounter: Payer: Self-pay | Admitting: Family Medicine

## 2019-05-03 ENCOUNTER — Other Ambulatory Visit: Payer: Self-pay

## 2019-05-03 DIAGNOSIS — M109 Gout, unspecified: Secondary | ICD-10-CM

## 2019-05-03 MED ORDER — TRAZODONE HCL 50 MG PO TABS: 25.0000 mg | ORAL_TABLET | Freq: Every evening | ORAL | 3 refills | Status: DC | PRN

## 2019-05-03 MED ORDER — COLCHICINE 0.6 MG PO TABS: 0.6000 mg | ORAL_TABLET | Freq: Two times a day (BID) | ORAL | 0 refills | Status: DC

## 2019-05-03 MED FILL — COLCHICINE 0.6 MG TABS: 0.6 | 30 days supply | Qty: 60 | Fill #0

## 2019-05-27 MED FILL — PRAVASTATIN SODIUM 20 MG TA: 20 | 90 days supply | Qty: 90 | Fill #1

## 2019-06-28 ENCOUNTER — Other Ambulatory Visit: Payer: Self-pay | Admitting: Family Medicine

## 2019-06-28 DIAGNOSIS — M109 Gout, unspecified: Secondary | ICD-10-CM

## 2019-06-28 MED ORDER — COLCHICINE 0.6 MG PO TABS: 0.6000 mg | ORAL_TABLET | Freq: Two times a day (BID) | ORAL | 0 refills | Status: DC

## 2019-06-28 MED FILL — COLCHICINE 0.6 MG TABS: 0.6 | 30 days supply | Qty: 60 | Fill #0

## 2019-06-28 MED FILL — traZODone HCL 50 MG TABS: 50 | 90 days supply | Qty: 90 | Fill #1

## 2019-06-28 MED FILL — CITALOPRAM HBR 40 MG TABLET: 40 | 90 days supply | Qty: 90 | Fill #1

## 2019-06-28 MED FILL — AMLODIPINE BESYLATE 5 MG TA: 5 | 90 days supply | Qty: 90 | Fill #1

## 2019-06-28 MED FILL — HYDROCHLOROTHIAZIDE 25 MG T: 25 | 90 days supply | Qty: 90 | Fill #1

## 2019-08-05 ENCOUNTER — Telehealth: Payer: No Typology Code available for payment source | Admitting: Family Medicine

## 2019-08-12 ENCOUNTER — Encounter: Payer: No Typology Code available for payment source | Admitting: Family Medicine

## 2019-09-06 ENCOUNTER — Other Ambulatory Visit: Payer: Self-pay | Admitting: Family Medicine

## 2019-09-06 MED FILL — PRAVASTATIN SODIUM 20 MG TA: 20 | 90 days supply | Qty: 90 | Fill #0

## 2019-09-08 MED FILL — traZODone HCL 50 MG TABS: 50 | 90 days supply | Qty: 90 | Fill #0

## 2019-09-17 ENCOUNTER — Telehealth: Payer: Self-pay

## 2019-09-17 NOTE — Telephone Encounter (Signed)
Questions for Screening COVID-19  Symptom onset: None  Travel or Contacts: None  During this illness, did/does the patient experience any of the following symptoms? Fever >100.10F []   Yes [x]   No []   Unknown Subjective fever (felt feverish) []   Yes [x]   No []   Unknown Chills []   Yes [x]   No []   Unknown Muscle aches (myalgia) []   Yes [x]   No []   Unknown Runny nose (rhinorrhea) []   Yes [x]   No []   Unknown Sore throat []   Yes [x]   No []   Unknown Cough (new onset or worsening of chronic cough) []   Yes [x]   No []   Unknown Shortness of breath (dyspnea) []   Yes [x]   No []   Unknown Nausea or vomiting []   Yes [x]   No []   Unknown Headache []   Yes [x]   No []   Unknown Abdominal pain  []   Yes [x]   No []   Unknown Diarrhea (?3 loose/looser than normal stools/24hr period) []   Yes [x]   No []   Unknown Other, specify:  Patient risk factors: Smoker? []   Current []   Former []   Never If female, currently pregnant? []   Yes []   No  Patient Active Problem List   Diagnosis Date Noted  . Asthmatic bronchitis 12/28/2018  . Neck fullness 09/28/2018  . History of thymectomy 09/28/2018  . Snoring 12/02/2016  . 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 Palpitations 12/22/2015  . Nonspecific abnormal electrocardiogram (ECG) (EKG) 12/22/2015  . Peripheral edema 12/22/2015  . Dyspnea on exertion 12/22/2015  . Insomnia 07/18/2015  . Fatigue 07/18/2015  . MG (myasthenia gravis) ([place]) 10/31/2014  . Hypertension   . Hyperlipidemia   . Depression     Plan:  []   High risk for COVID-19 with red flags go to ED (with CP, SOB, weak/lightheaded, or fever > 101.5). Call ahead.  []   High risk for COVID-19 but stable. Inform provider and coordinate time for [facility] visit.   []   No red flags but URI signs or symptoms okay for [facility] visit.

## 2019-09-19 DIAGNOSIS — Z0000 Encounter for general adult medical examination without abnormal findings: Secondary | ICD-10-CM | POA: Insufficient documentation

## 2019-09-19 NOTE — Assessment & Plan Note (Signed)
Well controlled on current rxs. Due for labs today. Orders Placed This Encounter  Procedures  . HIV antibody (with reflex)  . Hepatitis C Antibody  . CBC with Differential/Platelet  . Comprehensive metabolic panel  . Lipid panel  . TSH

## 2019-09-19 NOTE — Assessment & Plan Note (Addendum)
Reviewed preventive care protocols, scheduled due services, and updated immunizations Discussed nutrition, exercise, diet, and healthy lifestyle.  Influenza vaccine given to pt today.  Refer to GI for colonoscopy.  Has appt to see GYN in 2 weeks- follow up VAIN and for mammogram.

## 2019-09-19 NOTE — Progress Notes (Signed)
Subjective:   Patient ID: Regina Huber, female    DOB: Jun 22, 1961, 58 y.o.   MRN: [id number]  Regina Huber is a pleasant 58 y.o. year old female who presents to clinic today with Annual Exam (Patient is here today for a CPE. She is currently fasting and is requesting her flu shot. She is due for Colonoscopy and in agreement. I faxed request to Dr. Orvan Seen GYN for all recent results which she sees in 2 weeks.), folllow up, and Depression (needs screening- PHQ)  on 09/20/2019  HPI:  Health Maintenance  Topic Date Due  . Hepatitis C Screening  01-Dec-1961  . HIV Screening  08/05/1976  . COLONOSCOPY  08/06/2011  . INFLUENZA VACCINE  07/17/2019  . MAMMOGRAM  11/15/2019  . PAP SMEAR-Modifier  05/22/2020  . TETANUS/TDAP  03/25/2026   Has GYN- Dr. Julien Girt. Last pap smear  done 05/22/2017. Has never had a colonoscopy.  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 Julien Girt again in 2 weeks. Having mammogram done then too.  Working from home since 02/2019.  Initially felt more isolated but has adjusted to it now- getting out more.  HTN- has been well controlled on Norvasc 5 mg daily and HCTZ 25 mg daily. Denies HA, blurred vision, CP or SOB.  No LE edema.  BP Readings from Last 3 Encounters:  09/20/19 128/68  12/28/18 126/76  11/17/18 136/77   Lab Results  Component Value Date   CREATININE 0.80 09/28/2018   HLD-  Taking Pravachol 20 mg daily.  Has had some myalgias. Lab Results  Component Value Date   CHOL 194 05/01/2017   HDL 47.20 05/01/2017   LDLCALC 110 (H) 05/01/2017   LDLDIRECT 116.0 03/25/2016   TRIG 183.0 (H) 05/01/2017   CHOLHDL 4 05/01/2017   Lab Results  Component Value Date   ALT 15 09/28/2018   AST 12 09/28/2018   ALKPHOS 83 09/28/2018   BILITOT 0.6 09/28/2018   The 10-year ASCVD risk score Mikey Bussing DC Jr., et al., 2013) is: 4%   Values used to calculate the score:     Age: 89 years     Sex: Female     Is Non-Hispanic African American: No      Diabetic: No     Tobacco smoker: No     Systolic Blood Pressure: 0000000 mmHg     Is BP treated: Yes     HDL Cholesterol: 47.2 mg/dL     Total Cholesterol: 194 mg/dL  Depression- Taking celexa 40 mg daily.  Depression screen [facility] 2/9 09/20/2019 09/28/2018 11/20/2017  Decreased Interest 1 0 0  Down, Depressed, Hopeless 1 0 0  PHQ - 2 Score 2 0 0  Altered sleeping 1 2 0  Tired, decreased energy 1 2 0  Change in appetite 0 0 0  Feeling bad or failure about yourself  0 0 0  Trouble concentrating 0 0 0  Moving slowly or fidgety/restless 0 0 0  Suicidal thoughts 0 0 0  PHQ-9 Score 4 4 0  Difficult doing work/chores Not difficult at all - -   GAD 7 : Generalized Anxiety Score 09/20/2019 09/28/2018  Nervous, Anxious, on Edge 1 0  Control/stop worrying 1 0  Worry too much - different things 1 0  Trouble relaxing 1 0  Restless 0 0  Easily annoyed or irritable 0 0  Afraid - awful might happen 0 0  Total GAD 7 Score 4 0  Anxiety Difficulty Not difficult  at all -      Insomnia-  Start trazodone last year.  Myasthenia Gravis- remote history of thymectomy.   Current Outpatient Medications on File Prior to Visit  Medication Sig Dispense Refill  . amLODipine (NORVASC) 5 MG tablet TAKE 1 TABLET BY MOUTH DAILY. 90 tablet 1  . citalopram (CELEXA) 40 MG tablet TAKE 1 TABLET (40 MG TOTAL) BY MOUTH DAILY. 90 tablet 1  . colchicine 0.6 MG tablet Take 1 tablet (0.6 mg total) by mouth 2 (two) times daily. For 7-10 days and then as needed for attacks. 60 tablet 0  . hydrochlorothiazide (HYDRODIURIL) 25 MG tablet TAKE 1 TABLET BY MOUTH DAILY. 90 tablet 0  . pravastatin (PRAVACHOL) 20 MG tablet TAKE 1 TABLET BY MOUTH DAILY WITH EVENING MEAL 90 tablet 0  . traZODone (DESYREL) 50 MG tablet Take 0.5-1 tablets (25-50 mg total) by mouth at bedtime as needed for sleep. 90 tablet 3   No current facility-administered medications on file prior to visit.     Allergies  Allergen Reactions  .  Penicillins Rash    - has tolerated cephalosporins    Past Medical History:  Diagnosis Date  . Allergy    mild - no meds   . Anemia    with twin pregnancy   . Depression   . Dyspnea    slight sob when lying flat  . 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 Hyperlipidemia   . Hypertension   . Myasthenia gravis ([place])    dx 2005   s/p  thymectomy 2006--  per pt no other symtpoms since  . Neck stiffness   . Palpitations   . Transfusion history    s/p hysterectomy. pt states she received 9 pints of blood  . Vaginal intraepithelial neoplasia III (VAIN III)     Past Surgical History:  Procedure Laterality Date  . ABDOMINAL HYSTERECTOMY  05-07-2000   w/  Right Ovarian Cystectomy  . CARDIOVASCULAR STRESS TEST  12-11-2016  dr Daneen Schick   normal nuclera study w/ no ischemia/  normal LV function and wall motion, nuclear stress ef 59% (BP demonstrated hypertensive response to exercise)  . [place]  . CO2 LASER APPLICATION N/A [id number]   Procedure: CO2 LASER VAPORIZATION OF THE VAGINA;  Surgeon: Everitt Amber, MD;  Location: [place];  Service: Gynecology;  Laterality: N/A;  . CO2 LASER APPLICATION N/A [id number]   Procedure: CO2 LASER VAPORIZATION OF THE VAGINA;  Surgeon: Everitt Amber, MD;  Location: [place];  Service: Gynecology;  Laterality: N/A;  . COMBINED ABDOMINOPLASTY AND LIPOSUCTION  03-12-2002  . LAPAROTOMY/  SUTURE INTRAPERITONEAL BLEEDING  05-08-2000  . LUMBAR DISC SURGERY  11-07-2010   L5 - S1  . TRANSSTERNAL THYMECTOMY  04-29-2005  . TRANSTHORACIC ECHOCARDIOGRAM  12-11-2016  dr Daneen Schick   grade 1 diastolic dysfunction, ef [id number]  trivial MR, PR, and TR    Family History  Problem Relation Age of Onset  . Cancer Mother 63       breast CA  . Hypertension Mother   . Diabetes Mother   . Breast cancer Mother   . Cancer Father 62       colon CA   . Alcohol abuse Father   . Hypertension Father   . Stroke Father   . Colon cancer Father 43  . Heart disease Brother 61  MI  . Colon polyps Brother   . Colon polyps Sister   . Colon polyps Sister   . Colon polyps Brother   . Esophageal cancer Neg Hx   . Rectal cancer Neg Hx   . Stomach cancer Neg Hx     Social History   Socioeconomic History  . Marital status: Single    Spouse name: Not on file  . Number of children: Not on file  . Years of education: Not on file  . Highest education level: Not on file  Occupational History  . Not on file  Social Needs  . Financial resource strain: Not on file  . Food insecurity    Worry: Not on file    Inability: Not on file  . Transportation needs    Medical: Not on file    Non-medical: Not on file  Tobacco Use  . Smoking status: Never Smoker  . Smokeless tobacco: Never Used  Substance and Sexual Activity  . Alcohol use: Yes    Comment: rarely  . Drug use: No  . Sexual activity: Not on file  Lifestyle  . Physical activity    Days per week: Not on file    Minutes per session: Not on file  . Stress: Not on file  Relationships  . Social Herbalist on phone: Not on file    Gets together: Not on file    Attends religious service: Not on file    Active member of club or organization: Not on file    Attends meetings of clubs or organizations: Not on file    Relationship status: Not on file  . Intimate partner violence    Fear of current or ex partner: Not on file    Emotionally abused: Not on file    Physically abused: Not on file    Forced sexual activity: Not on file  Other Topics Concern  . Not on file  Social History Narrative   Married, twin daughters born 55, son born 1988   Works at [employer] in [employer], [employer].   The PMH, PSH, Social History, Family History, Medications, and allergies have been reviewed in [facility], and have been updated if relevant.  Review of Systems  Constitutional: Negative.   HENT:  Negative.   Eyes: Negative.   Respiratory: Negative.   Cardiovascular: Negative.   Gastrointestinal: Negative.   Endocrine: Negative.   Genitourinary: Negative.   Musculoskeletal: Positive for arthralgias and myalgias.  Skin: Negative.   Allergic/Immunologic: Negative.   Neurological: Negative.   Hematological: Negative.   Psychiatric/Behavioral: Negative.   All other systems reviewed and are negative.      Objective:    BP 128/68 (BP Location: Left Arm, Patient Position: Sitting, Cuff Size: Normal)   Pulse 60   Temp 98.4 F (36.9 C) (Oral)   Ht 5' 6.5" (1.689 m)   Wt 204 lb (92.5 kg)   SpO2 96%   BMI 32.43 kg/m    Physical Exam Vitals signs and nursing note reviewed.  HENT:     Head: Normocephalic and atraumatic.     Mouth/Throat:     Mouth: Mucous membranes are moist.  Eyes:     Extraocular Movements: Extraocular movements intact.  Neck:     Musculoskeletal: Normal range of motion and neck supple.     Thyroid: No thyromegaly.     Vascular: No JVD.  Cardiovascular:     Rate and Rhythm: Normal rate.  Pulmonary:     Effort: Pulmonary effort  is normal.  Abdominal:     General: Abdomen is flat.     Palpations: Abdomen is soft.  Musculoskeletal: Normal range of motion.  Lymphadenopathy:     Cervical: No cervical adenopathy.  Skin:    General: Skin is warm and dry.  Neurological:     Mental Status: She is alert and oriented to person, place, and time.     Cranial Nerves: No cranial nerve deficit.     Coordination: Coordination normal.     Deep Tendon Reflexes: Reflexes normal.  Psychiatric:        Behavior: Behavior normal.        Thought Content: Thought content normal.        Judgment: Judgment normal.           Assessment & Plan:   Well woman exam without gynecological exam  Other hyperlipidemia - Plan: CBC with Differential/Platelet, Comprehensive metabolic panel, Lipid panel, TSH  Essential hypertension - Plan: CBC with  Differential/Platelet, Comprehensive metabolic panel, TSH  Myasthenia gravis (HCC), Chronic  VAIN II (vaginal intraepithelial neoplasia grade II)  Insomnia, unspecified type  Screening for HIV (human immunodeficiency virus) - Plan: HIV antibody (with reflex)  Need for hepatitis C screening test - Plan: Hepatitis C Antibody  Need for influenza vaccination - Plan: Flu Vaccine QUAD 6+ mos PF IM (Fluarix Quad PF)  Screening for colon cancer - Plan: Ambulatory referral to Gastroenterology No follow-ups on file.

## 2019-09-20 ENCOUNTER — Encounter: Payer: Self-pay | Admitting: Family Medicine

## 2019-09-20 ENCOUNTER — Ambulatory Visit (INDEPENDENT_AMBULATORY_CARE_PROVIDER_SITE_OTHER): Payer: No Typology Code available for payment source | Admitting: Family Medicine

## 2019-09-20 VITALS — BP 128/68 | HR 60 | Temp 98.4°F | Ht 66.5 in | Wt 204.0 lb

## 2019-09-20 DIAGNOSIS — G7000 Myasthenia gravis without (acute) exacerbation: Secondary | ICD-10-CM | POA: Diagnosis not present

## 2019-09-20 DIAGNOSIS — Z1159 Encounter for screening for other viral diseases: Secondary | ICD-10-CM

## 2019-09-20 DIAGNOSIS — Z114 Encounter for screening for human immunodeficiency virus [HIV]: Secondary | ICD-10-CM

## 2019-09-20 DIAGNOSIS — Z23 Encounter for immunization: Secondary | ICD-10-CM | POA: Diagnosis not present

## 2019-09-20 DIAGNOSIS — E7849 Other hyperlipidemia: Secondary | ICD-10-CM | POA: Diagnosis not present

## 2019-09-20 DIAGNOSIS — Z1211 Encounter for screening for malignant neoplasm of colon: Secondary | ICD-10-CM

## 2019-09-20 DIAGNOSIS — Z0000 Encounter for general adult medical examination without abnormal findings: Secondary | ICD-10-CM | POA: Diagnosis not present

## 2019-09-20 DIAGNOSIS — G4700 Insomnia, unspecified: Secondary | ICD-10-CM

## 2019-09-20 DIAGNOSIS — I10 Essential (primary) hypertension: Secondary | ICD-10-CM | POA: Diagnosis not present

## 2019-09-20 DIAGNOSIS — F3289 Other specified depressive episodes: Secondary | ICD-10-CM

## 2019-09-20 DIAGNOSIS — N891 Moderate vaginal dysplasia: Secondary | ICD-10-CM

## 2019-09-20 LAB — CBC WITH DIFFERENTIAL/PLATELET
Basophils Absolute: 0 10*3/uL (ref 0.0–0.1)
Basophils Relative: 0.6 % (ref 0.0–3.0)
Eosinophils Absolute: 0.2 10*3/uL (ref 0.0–0.7)
Eosinophils Relative: 2.5 % (ref 0.0–5.0)
HCT: 41 % (ref 36.0–46.0)
Hemoglobin: 13.9 g/dL (ref 12.0–15.0)
Lymphocytes Relative: 24.2 % (ref 12.0–46.0)
Lymphs Abs: 1.9 10*3/uL (ref 0.7–4.0)
MCHC: 33.8 g/dL (ref 30.0–36.0)
MCV: 83.4 fl (ref 78.0–100.0)
Monocytes Absolute: 0.5 10*3/uL (ref 0.1–1.0)
Monocytes Relative: 6 % (ref 3.0–12.0)
Neutro Abs: 5.1 10*3/uL (ref 1.4–7.7)
Neutrophils Relative %: 66.7 % (ref 43.0–77.0)
Platelets: 286 10*3/uL (ref 150.0–400.0)
RBC: 4.91 Mil/uL (ref 3.87–5.11)
RDW: 13.9 % (ref 11.5–15.5)
WBC: 7.7 10*3/uL (ref 4.0–10.5)

## 2019-09-20 LAB — COMPREHENSIVE METABOLIC PANEL
ALT: 14 U/L (ref 0–35)
AST: 15 U/L (ref 0–37)
Albumin: 4.3 g/dL (ref 3.5–5.2)
Alkaline Phosphatase: 86 U/L (ref 39–117)
BUN: 12 mg/dL (ref 6–23)
CO2: 29 mEq/L (ref 19–32)
Calcium: 9.6 mg/dL (ref 8.4–10.5)
Chloride: 101 mEq/L (ref 96–112)
Creatinine, Ser: 0.73 mg/dL (ref 0.40–1.20)
GFR: 81.85 mL/min (ref >60.00)
Glucose, Bld: 99 mg/dL (ref 70–99)
Potassium: 3 mEq/L — ABNORMAL LOW (ref 3.5–5.1)
Sodium: 141 mEq/L (ref 135–145)
Total Bilirubin: 0.6 mg/dL (ref 0.2–1.2)
Total Protein: 7.3 g/dL (ref 6.0–8.3)

## 2019-09-20 LAB — TSH: TSH: 0.86 u[IU]/mL (ref 0.35–4.50)

## 2019-09-20 LAB — LIPID PANEL
Cholesterol: 169 mg/dL (ref 0–200)
HDL: 41.3 mg/dL (ref >39.00)
LDL Cholesterol: 89 mg/dL (ref 0–99)
NonHDL: 127.47
Total CHOL/HDL Ratio: 4
Triglycerides: 192 mg/dL — ABNORMAL HIGH (ref 0.0–149.0)
VLDL: 38.4 mg/dL (ref 0.0–40.0)

## 2019-09-20 NOTE — Assessment & Plan Note (Signed)
Now with some myalgias- she is going to try to cut back to 10 mg three times weekly and update me.  If she sticks to that, we will repeat labs in a few months.

## 2019-09-20 NOTE — Assessment & Plan Note (Signed)
Doing well with as needed insomnia. No changes made.

## 2019-09-20 NOTE — Assessment & Plan Note (Signed)
Well controlled on current dose of celexa. No changes made to rx.

## 2019-09-20 NOTE — Patient Instructions (Addendum)
Great to see you! I will call you with your lab results from today and you can view them online.   Try taking your Pravachol 1/2 tablet (10 mg) - 3 times a week to see if it helps with your muscle aches.  Keep me updated.   I am referring you to Dr. Hilarie Fredrickson for a colonoscopy.

## 2019-09-21 LAB — HEPATITIS C ANTIBODY
Hepatitis C Ab: NONREACTIVE
SIGNAL TO CUT-OFF: 0.01 (ref <1.00)

## 2019-09-21 LAB — HIV ANTIBODY (ROUTINE TESTING W REFLEX): HIV 1&2 Ab, 4th Generation: NONREACTIVE

## 2019-09-22 ENCOUNTER — Encounter: Payer: Self-pay | Admitting: Family Medicine

## 2019-09-22 ENCOUNTER — Encounter: Payer: Self-pay | Admitting: Internal Medicine

## 2019-09-23 ENCOUNTER — Other Ambulatory Visit: Payer: Self-pay | Admitting: Family Medicine

## 2019-09-23 ENCOUNTER — Encounter: Payer: Self-pay | Admitting: Family Medicine

## 2019-09-23 MED FILL — AMLODIPINE BESYLATE 5 MG TA: 5 | 90 days supply | Qty: 90 | Fill #0

## 2019-09-23 NOTE — Telephone Encounter (Signed)
Is it okay to approve rx refill?

## 2019-10-08 ENCOUNTER — Other Ambulatory Visit: Payer: Self-pay | Admitting: Family Medicine

## 2019-10-09 ENCOUNTER — Other Ambulatory Visit: Payer: Self-pay | Admitting: Family Medicine

## 2019-10-12 MED ORDER — HYDROCHLOROTHIAZIDE 25 MG PO TABS: 25.0000 mg | ORAL_TABLET | Freq: Every day | ORAL | 3 refills | Status: DC

## 2019-10-12 MED FILL — HYDROCHLOROTHIAZIDE 25 MG T: 25 | 90 days supply | Qty: 90 | Fill #0

## 2019-10-12 MED FILL — CITALOPRAM HBR 40 MG TABLET: 40 | 90 days supply | Qty: 90 | Fill #0

## 2019-10-18 ENCOUNTER — Ambulatory Visit (AMBULATORY_SURGERY_CENTER): Payer: Self-pay

## 2019-10-18 ENCOUNTER — Other Ambulatory Visit: Payer: Self-pay

## 2019-10-18 ENCOUNTER — Encounter: Payer: Self-pay | Admitting: Internal Medicine

## 2019-10-18 VITALS — Temp 96.6°F | Ht 66.5 in | Wt 205.8 lb

## 2019-10-18 DIAGNOSIS — Z1211 Encounter for screening for malignant neoplasm of colon: Secondary | ICD-10-CM

## 2019-10-18 MED ORDER — NA SULFATE-K SULFATE-MG SULF 17.5-3.13-1.6 GM/177ML PO SOLN: 1.0000 | Freq: Once | ORAL | 0 refills | Status: AC

## 2019-10-18 MED FILL — SUPREP BOWEL PREP KIT: 17.5-3.13-1 | 2 days supply | Qty: 354 | Fill #0

## 2019-10-18 NOTE — Progress Notes (Signed)
Denies allergies to eggs or soy products. Denies complication of anesthesia or sedation. Denies use of weight loss medication. Denies use of O2.   Emmi instructions given for colonoscopy.  Covid screening is scheduled for Wednesday 10/27/19 @ 3:30 Pm.

## 2019-10-21 ENCOUNTER — Encounter: Payer: Self-pay | Admitting: Family Medicine

## 2019-10-26 ENCOUNTER — Telehealth: Payer: Self-pay

## 2019-10-26 NOTE — Telephone Encounter (Signed)
Per TA letter created and message sent to pt/thx dmf

## 2019-10-27 ENCOUNTER — Ambulatory Visit (INDEPENDENT_AMBULATORY_CARE_PROVIDER_SITE_OTHER): Payer: No Typology Code available for payment source

## 2019-10-27 ENCOUNTER — Other Ambulatory Visit: Payer: Self-pay | Admitting: Internal Medicine

## 2019-10-27 DIAGNOSIS — Z1159 Encounter for screening for other viral diseases: Secondary | ICD-10-CM

## 2019-10-28 LAB — SARS CORONAVIRUS 2 (TAT 6-24 HRS): SARS Coronavirus 2: NEGATIVE

## 2019-11-01 ENCOUNTER — Encounter: Payer: Self-pay | Admitting: Internal Medicine

## 2019-11-01 ENCOUNTER — Ambulatory Visit (AMBULATORY_SURGERY_CENTER): Payer: No Typology Code available for payment source | Admitting: Internal Medicine

## 2019-11-01 ENCOUNTER — Other Ambulatory Visit: Payer: Self-pay

## 2019-11-01 VITALS — BP 133/97 | HR 68 | Temp 98.2°F | Resp 15 | Ht 66.5 in | Wt 205.8 lb

## 2019-11-01 DIAGNOSIS — Z1211 Encounter for screening for malignant neoplasm of colon: Secondary | ICD-10-CM | POA: Diagnosis not present

## 2019-11-01 DIAGNOSIS — D123 Benign neoplasm of transverse colon: Secondary | ICD-10-CM | POA: Diagnosis not present

## 2019-11-01 MED ORDER — SODIUM CHLORIDE 0.9 % IV SOLN: 500.0000 mL | Freq: Once | INTRAVENOUS | Status: DC

## 2019-11-01 NOTE — Progress Notes (Signed)
Pt's states no medical or surgical changes since previsit or office visit. 

## 2019-11-01 NOTE — Patient Instructions (Signed)
   HANDOUTS ON POLYPS ,DIVERTICULOSIS ,& HEMORRHOIDS GIVEN TO YOU TODAY   AWAIT PATHOLOGY RESULTS ON POLYP REMOVED TODAY     YOU HAD AN ENDOSCOPIC PROCEDURE TODAY AT [facility]:   Refer to the procedure report that was given to you for any specific questions about what was found during the examination.  If the procedure report does not answer your questions, please call your gastroenterologist to clarify.  If you requested that your care partner not be given the details of your procedure findings, then the procedure report has been included in a sealed envelope for you to review at your convenience later.  YOU SHOULD EXPECT: Some feelings of bloating in the abdomen. Passage of more gas than usual.  Walking can help get rid of the air that was put into your GI tract during the procedure and reduce the bloating. If you had a lower endoscopy (such as a colonoscopy or flexible sigmoidoscopy) you may notice spotting of blood in your stool or on the toilet paper. If you underwent a bowel prep for your procedure, you may not have a normal bowel movement for a few days.  Please Note:  You might notice some irritation and congestion in your nose or some drainage.  This is from the oxygen used during your procedure.  There is no need for concern and it should clear up in a day or so.  SYMPTOMS TO REPORT IMMEDIATELY:   Following lower endoscopy (colonoscopy or flexible sigmoidoscopy):  Excessive amounts of blood in the stool  Significant tenderness or worsening of abdominal pains  Swelling of the abdomen that is new, acute  Fever of 100F or higher    For urgent or emergent issues, a gastroenterologist can be reached at any hour by calling [phone].   DIET:  We do recommend a small meal at first, but then you may proceed to your regular diet.  Drink plenty of fluids but you should avoid alcoholic beverages for 24 hours.  ACTIVITY:  You should plan to take it easy for the  rest of today and you should NOT DRIVE or use heavy machinery until tomorrow (because of the sedation medicines used during the test).    FOLLOW UP: Our staff will call the number listed on your records 48-72 hours following your procedure to check on you and address any questions or concerns that you may have regarding the information given to you following your procedure. If we do not reach you, we will leave a message.  We will attempt to reach you two times.  During this call, we will ask if you have developed any symptoms of COVID 19. If you develop any symptoms (ie: fever, flu-like symptoms, shortness of breath, cough etc.) before then, please call [phone].  If you test positive for Covid 19 in the 2 weeks post procedure, please call and report this information to [place].    If any biopsies were taken you will be contacted by phone or by letter within the next 1-3 weeks.  Please call us at [phone] if you have not heard about the biopsies in 3 weeks.    SIGNATURES/CONFIDENTIALITY: You and/or your care partner have signed paperwork which will be entered into your electronic medical record.  These signatures attest to the fact that that the information above on your After Visit Summary has been reviewed and is understood.  Full responsibility of the confidentiality of this discharge information lies with you and/or your care-partner.

## 2019-11-01 NOTE — Progress Notes (Signed)
PT taken to PACU. Monitors in place. VSS. Report given to RN. 

## 2019-11-01 NOTE — Op Note (Signed)
[facility] Patient Name: Regina Huber Procedure Date: 11/01/2019 10:46 AM MRN: [id number] Endoscopist: Jerene Bears , MD Age: 58 Referring MD:  Date of Birth: 1960/12/23 Gender: Female Account #: [id number] Procedure:                Colonoscopy Indications:              Screening for colorectal malignant neoplasm, This                            is the patient's first colonoscopy Medicines:                Monitored Anesthesia Care Procedure:                Pre-Anesthesia Assessment:                           - Prior to the procedure, a History and Physical                            was performed, and patient medications and                            allergies were reviewed. The patient's tolerance of                            previous anesthesia was also reviewed. The risks                            and benefits of the procedure and the sedation                            options and risks were discussed with the patient.                            All questions were answered, and informed consent                            was obtained. Prior Anticoagulants: The patient has                            taken no previous anticoagulant or antiplatelet                            agents. ASA Grade Assessment: II - A patient with                            mild systemic disease. After reviewing the risks                            and benefits, the patient was deemed in                            satisfactory condition to undergo the procedure.  After obtaining informed consent, the colonoscope                            was passed under direct vision. Throughout the                            procedure, the patient's blood pressure, pulse, and                            oxygen saturations were monitored continuously. The                            Colonoscope was introduced through the anus and                            advanced to the cecum,  identified by appendiceal                            orifice and ileocecal valve. The colonoscopy was                            performed without difficulty. The patient tolerated                            the procedure well. The quality of the bowel                            preparation was good. The ileocecal valve,                            appendiceal orifice, and rectum were photographed. Scope In: 11:01:16 AM Scope Out: 11:17:53 AM Scope Withdrawal Time: 0 hours 13 minutes 45 seconds  Total Procedure Duration: 0 hours 16 minutes 37 seconds  Findings:                 The digital rectal exam was normal.                           A 6 mm polyp was found in the transverse colon. The                            polyp was sessile. The polyp was removed with a                            cold snare. Resection and retrieval were complete.                           A few medium-mouthed diverticula were found in the                            sigmoid colon.                           Internal hemorrhoids and hypertrophied anal  papillae were found during retroflexion. The                            hemorrhoids were small. Complications:            No immediate complications. Estimated Blood Loss:     Estimated blood loss was minimal. Impression:               - One 6 mm polyp in the transverse colon, removed                            with a cold snare. Resected and retrieved.                           - Diverticulosis in the sigmoid colon.                           - Small internal hemorrhoids. Recommendation:           - Patient has a contact number available for                            emergencies. The signs and symptoms of potential                            delayed complications were discussed with the                            patient. Return to normal activities tomorrow.                            Written discharge instructions were provided to the                             patient.                           - Resume previous diet.                           - Continue present medications.                           - Await pathology results.                           - Repeat colonoscopy is recommended for                            surveillance. The colonoscopy date will be                            determined after pathology results from today's                            exam become available for review. Jerene Bears, MD 11/01/2019 11:23:48 AM This report has been signed electronically.

## 2019-11-01 NOTE — Progress Notes (Signed)
Temperature taken by J.B., VS taken by C.W. 

## 2019-11-03 ENCOUNTER — Telehealth: Payer: Self-pay

## 2019-11-03 NOTE — Telephone Encounter (Signed)
  Follow up Call-  Call back number 11/01/2019  Post procedure Call Back phone  # [phone]  Permission to leave phone message Yes  Some recent data might be hidden     Patient questions:  Do you have a fever, pain , or abdominal swelling? No. Pain Score  0 *  Have you tolerated food without any problems? Yes.    Have you been able to return to your normal activities? Yes.    Do you have any questions about your discharge instructions: Diet   No. Medications  No. Follow up visit  No.  Do you have questions or concerns about your Care? No.  Actions: * If pain score is 4 or above: No action needed, pain <4. 1. Have you developed a fever since your procedure? no  2.   Have you had an respiratory symptoms (SOB or cough) since your procedure? no  3.   Have you tested positive for COVID 19 since your procedure *no  4.   Have you had any family members/close contacts diagnosed with the COVID 19 since your procedure?  no   If yes to any of these questions please route to Joylene John, RN and Alphonsa Gin, Therapist, sports.

## 2019-11-09 ENCOUNTER — Encounter: Payer: Self-pay | Admitting: Internal Medicine

## 2019-11-15 ENCOUNTER — Encounter: Payer: Self-pay | Admitting: Family Medicine

## 2019-11-15 ENCOUNTER — Other Ambulatory Visit: Payer: Self-pay | Admitting: Family Medicine

## 2019-11-15 DIAGNOSIS — M25569 Pain in unspecified knee: Secondary | ICD-10-CM

## 2019-11-17 ENCOUNTER — Ambulatory Visit (INDEPENDENT_AMBULATORY_CARE_PROVIDER_SITE_OTHER): Payer: No Typology Code available for payment source | Admitting: Family Medicine

## 2019-11-17 ENCOUNTER — Encounter: Payer: Self-pay | Admitting: Family Medicine

## 2019-11-17 ENCOUNTER — Other Ambulatory Visit: Payer: Self-pay

## 2019-11-17 ENCOUNTER — Ambulatory Visit (INDEPENDENT_AMBULATORY_CARE_PROVIDER_SITE_OTHER)
Admission: RE | Admit: 2019-11-17 | Discharge: 2019-11-17 | Disposition: A | Discharge status: Home / Self Care | Payer: No Typology Code available for payment source | Source: Ambulatory Visit | Attending: Family Medicine | Admitting: Family Medicine

## 2019-11-17 VITALS — BP 132/80 | HR 79 | Temp 98.0°F | Ht 66.5 in | Wt 205.5 lb

## 2019-11-17 DIAGNOSIS — M25561 Pain in right knee: Secondary | ICD-10-CM

## 2019-11-17 DIAGNOSIS — M1711 Unilateral primary osteoarthritis, right knee: Secondary | ICD-10-CM | POA: Diagnosis not present

## 2019-11-17 DIAGNOSIS — M2240 Chondromalacia patellae, unspecified knee: Secondary | ICD-10-CM

## 2019-11-17 DIAGNOSIS — M25562 Pain in left knee: Secondary | ICD-10-CM

## 2019-11-17 MED ORDER — METHYLPREDNISOLONE ACETATE 40 MG/ML IJ SUSP
80.0000 mg | Freq: Once | INTRAMUSCULAR | Status: AC
  Administered 2019-11-17: 80 mg via INTRA_ARTICULAR

## 2019-11-17 NOTE — Patient Instructions (Signed)
OSTEOARTHRITIS:  For symptomatic relief:  Tylenol: 2 tablets up to 3-4 times a day Regular NSAIDS are helpful (avoid in kidney disease and ulcers)  Topical Capzaicin Cream, as needed (wear glove to put on) - THIS IS EXCEPTIONALLY HOT Supplements: Tart cherry juice and Curcumin (Turmeric extract) have good scientific evidence  For flares, corticosteroid injections help. Hyaluronic Acid injections have good success, average relief is 6 months  Glucosamine and Chondroitin often helpful - will take about 3 months to see if you have an effect. If you do, great, keep them up, if none at that point, no need to take in the future.  Omega-3 fish oils may help, 2 grams daily  Ice joints on bad days, 20 min, 2-3 x / day REGULAR EXERCISE: swimming, Yoga, Tai Chi, bicycle (NON-IMPACT activity)   Weight loss will always take stress off of the joints and back

## 2019-11-17 NOTE — Progress Notes (Signed)
Lean Fayson T. Kaden Daughdrill, MD [facility] and [facility] at [facility] [place], [address] Phone: [phone]  FAX: [phone]  Regina Huber - 58 y.o. female  MRN [id number]  Date of Birth: [birth year]  Visit Date: 11/17/2019  PCP: Lucille Passy, MD  Referred by: Lucille Passy, MD  Chief Complaint  Patient presents with  . Knee Pain    Bilateral   Subjective:   SHAILEE FOOTS is a 58 y.o. very pleasant female patient with Body mass index is 32.67 kg/m. who presents with the following:  She presents today courtesy of Dr. Deborra Medina for evaluation of bilateral knee pain.  She does have significant crepitus bilaterally.  Her pain is primarily going up and down stairs, getting in and out of a car, rotational movements, and getting up from a seated position.  She has not had any sort of known traumatic injury and no prior fractures or surgery.  Her right knee is the knee that is predominantly bothering her, and this is at the medial joint line by history.  She is not getting any kind of significant effusions.  This is the first time this knee has really been evaluated.  She has been taking a large volume of ibuprofen, and she also tried some glucosamine and conjoint for about 6 weeks which did not help at all.  During the COVID-19 outbreak she has gained 15 pounds and has been exercising less.  R > L medial joint like.    Past Medical History, Surgical History, Social History, Family History, Problem List, Medications, and Allergies have been reviewed and updated if relevant.  Patient Active Problem List   Diagnosis Date Noted  . Well woman exam without gynecological exam 09/19/2019  . Asthmatic bronchitis 12/28/2018  . History of thymectomy 09/28/2018  . Snoring 12/02/2016  . 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 Peripheral edema 12/22/2015  . Dyspnea on exertion 12/22/2015  . Insomnia 07/18/2015   . Fatigue 07/18/2015  . Myasthenia gravis ([place]) 10/31/2014  . Hypertension   . Hyperlipidemia   . Depression     Past Medical History:  Diagnosis Date  . Allergy    mild - no meds   . Anemia    with twin pregnancy   . Depression   . Dyspnea    slight sob when lying flat  . 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 Hyperlipidemia   . Hypertension   . Myasthenia gravis ([place])    dx 2005   s/p  thymectomy 2006--  per pt no other symtpoms since  . Neck stiffness   . Palpitations   . Transfusion history    s/p hysterectomy. pt states she received 9 pints of blood  . Vaginal intraepithelial neoplasia III (VAIN III)     Past Surgical History:  Procedure Laterality Date  . ABDOMINAL HYSTERECTOMY  05-07-2000   w/  Right Ovarian Cystectomy  . CARDIOVASCULAR STRESS TEST  12-11-2016  dr Daneen Schick   normal nuclera study w/ no ischemia/  normal LV function and wall motion, nuclear stress ef 59% (BP demonstrated hypertensive response to exercise)  . [place]  . CO2 LASER APPLICATION N/A 3/75/4237   Procedure: CO2 LASER VAPORIZATION OF THE VAGINA;  Surgeon: Everitt Amber, MD;  Location: [facility];  Service: Gynecology;  Laterality: N/A;  . CO2 LASER APPLICATION N/A 2/58/5277   Procedure: CO2 LASER VAPORIZATION OF THE VAGINA;  Surgeon: Everitt Amber, MD;  Location: [place];  Service: Gynecology;  Laterality: N/A;  . COMBINED ABDOMINOPLASTY AND LIPOSUCTION  03-12-2002  . LAPAROTOMY/  SUTURE INTRAPERITONEAL BLEEDING  05-08-2000  . LUMBAR DISC SURGERY  11-07-2010   L5 - S1  . TRANSSTERNAL THYMECTOMY  04-29-2005  . TRANSTHORACIC ECHOCARDIOGRAM  12-11-2016  dr Daneen Schick   grade 1 diastolic dysfunction, ef 82-42%/  trivial MR, PR, and TR    Social History   Socioeconomic History  . Marital status: Single    Spouse name: Not on file  . Number of children: Not on file   . Years of education: Not on file  . Highest education level: Not on file  Occupational History  . Not on file  Social Needs  . Financial resource strain: Not on file  . Food insecurity    Worry: Not on file    Inability: Not on file  . Transportation needs    Medical: Not on file    Non-medical: Not on file  Tobacco Use  . Smoking status: Never Smoker  . Smokeless tobacco: Never Used  Substance and Sexual Activity  . Alcohol use: Yes    Comment: rarely  . Drug use: No  . Sexual activity: Not on file  Lifestyle  . Physical activity    Days per week: Not on file    Minutes per session: Not on file  . Stress: Not on file  Relationships  . Social Herbalist on phone: Not on file    Gets together: Not on file    Attends religious service: Not on file    Active member of club or organization: Not on file    Attends meetings of clubs or organizations: Not on file    Relationship status: Not on file  . Intimate partner violence    Fear of current or ex partner: Not on file    Emotionally abused: Not on file    Physically abused: Not on file    Forced sexual activity: Not on file  Other Topics Concern  . Not on file  Social History Narrative   Married, twin daughters born 40, son born 1988   Works at [employer] in [employer], [employer].    Family History  Problem Relation Age of Onset  . Cancer Mother 38       breast CA  . Hypertension Mother   . Diabetes Mother   . Breast cancer Mother   . Cancer Father 69       colon CA  . Alcohol abuse Father   . Hypertension Father   . Stroke Father   . Colon cancer Father 28  . Heart disease Brother 58       MI  . Colon polyps Brother   . Colon polyps Sister   . Colon polyps Sister   . Colon polyps Brother   . Esophageal cancer Neg Hx   . Rectal cancer Neg Hx   . Stomach cancer Neg Hx     Allergies  Allergen Reactions  . Penicillins Rash    - has tolerated cephalosporins    Medication list reviewed and updated in full  in [place].  GEN: No fevers, chills. Nontoxic. Primarily MSK c/o today. MSK: Detailed in the HPI GI: tolerating PO intake without difficulty Neuro: No numbness, parasthesias, or tingling associated. Otherwise the  pertinent positives of the ROS are noted above.   Objective:   BP 132/80   Pulse 79   Temp 98 F (36.7 C) (Temporal)   Ht 5' 6.5" (1.689 m)   Wt 205 lb 8 oz (93.2 kg)   SpO2 96%   BMI 32.67 kg/m    GEN: WDWN, NAD, Non-toxic, Alert & Oriented x 3 HEENT: Atraumatic, Normocephalic.  Ears and Nose: No external deformity. EXTR: No clubbing/cyanosis/edema NEURO: Normal gait.  PSYCH: Normally interactive. Conversant. Not depressed or anxious appearing.  Calm demeanor.    Bilateral knee exam: Full extension bilaterally.  Flexion to 130 degrees without difficulty, but with terminal flexion on the right there is some significant pain.  Right-sided medial joint line is significant tender to palpation and worse in the posterior medial lateral joint line.  Nontender laterally and nontender on the left side medial and lateral.  Stable ACL, PCL, MCL, and LCL bilaterally.  There is some crepitus with motion, right greater than left as well as some pain at the medial patellar facet and lateral patellar facet right greater than left.  McMurray's and flexion pinch causes pain on the right.  Bounce home is negative bilaterally.  All provocative maneuvers on the left are grossly nontender.  Radiology: Dg Knee 4 Views W/patella Left  Result Date: 11/17/2019 CLINICAL DATA:  Bilateral knee pain.  No known injury. EXAM: LEFT KNEE - COMPLETE 4+ VIEW; RIGHT KNEE - COMPLETE 4+ VIEW COMPARISON:  None. FINDINGS: There is no acute bony or joint abnormality on the right or left. Irregularity of subchondral bone of both the right and left patella is worse on the right. There is also a small osteophyte off the superior margin of the right patella. Medial and lateral compartment joint spaces  are preserved. No erosion or focal bony lesion. No chondrocalcinosis or joint effusion. IMPRESSION: Right worse than left patellofemoral osteoarthritis. Electronically Signed   By: Inge Rise M.D.   On: 11/17/2019 15:14   Dg Knee 4 Views W/patella Right  Result Date: 11/17/2019 CLINICAL DATA:  Bilateral knee pain.  No known injury. EXAM: LEFT KNEE - COMPLETE 4+ VIEW; RIGHT KNEE - COMPLETE 4+ VIEW COMPARISON:  None. FINDINGS: There is no acute bony or joint abnormality on the right or left. Irregularity of subchondral bone of both the right and left patella is worse on the right. There is also a small osteophyte off the superior margin of the right patella. Medial and lateral compartment joint spaces are preserved. No erosion or focal bony lesion. No chondrocalcinosis or joint effusion. IMPRESSION: Right worse than left patellofemoral osteoarthritis. Electronically Signed   By: Inge Rise M.D.   On: 11/17/2019 15:14    The radiological images were independently reviewed by myself in the office and results were reviewed with the patient. My independent interpretation of images: The right knee does have some modest to mild narrowing at the medial joint space and also has some evidence of early subchondral sclerosis.  There is no evidence of fracture or dislocation.  There does appear to be some osteoarthritic changes to a mild degree at the patellofemoral joint as well bilaterally. Electronically Signed  By: Owens Loffler, MD On: 11/17/2019  9:20 AM EST   Assessment and Plan:     ICD-10-CM   1. Primary osteoarthritis of right knee  M17.11   2. Pain in both knees, unspecified chronicity  M25.561 DG Knee 4 Views W/Patella Left   M25.562 DG Knee 4 Views W/Patella Right  3.  Right knee pain, unspecified chronicity  M25.561 methylPREDNISolone acetate (DEPO-MEDROL) injection 80 mg  4. 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  Recommended weight loss to the patient and we went over multiple supplements that can be helpful.  We also talked about various forms of low impact exercise.  Patient Instructions  OSTEOARTHRITIS:  For symptomatic relief:  Tylenol: 2 tablets up to 3-4 times a day Regular NSAIDS are helpful (avoid in kidney disease and ulcers)  Topical Capzaicin Cream, as needed (wear glove to put on) - THIS IS EXCEPTIONALLY HOT Supplements: Tart cherry juice and Curcumin (Turmeric extract) have good scientific evidence  For flares, corticosteroid injections help. Hyaluronic Acid injections have good success, average relief is 6 months  Glucosamine and Chondroitin often helpful - will take about 3 months to see if you have an effect. If you do, great, keep them up, if none at that point, no need to take in the future.  Omega-3 fish oils may help, 2 grams daily  Ice joints on bad days, 20 min, 2-3 x / day REGULAR EXERCISE: swimming, Yoga, Tai Chi, bicycle (NON-IMPACT activity)   Weight loss will always take stress off of the joints and back     Aspiration/Injection Procedure Note MALLOREY ODONELL [birth year] Date of procedure: 11/17/2019  Procedure: Large Joint Aspiration / Injection of Knee, R Indications: Pain  Procedure Details Patient verbally consented to procedure. Risks (including potential rare risk of infection), benefits, and alternatives explained. Sterilely prepped with Chloraprep. Ethyl cholride used for anesthesia. 8 cc Lidocaine 1% mixed with 2 mL Depo-Medrol 40 mg injected using the anteromedial approach without difficulty. No complications with procedure and tolerated well. Patient had decreased pain post-injection. Medication: 2 mL of Depo-Medrol 40 mg, equaling Depo-Medrol 80 mg total   This visit occurred during the SARS-CoV-2 public health emergency.  Safety protocols were in place, including  screening questions prior to the visit, additional usage of staff PPE, and extensive cleaning of exam room while observing appropriate contact time as indicated for disinfecting solutions.   Follow-up: prn  Meds ordered this encounter  Medications  . methylPREDNISolone acetate (DEPO-MEDROL) injection 80 mg   Orders Placed This Encounter  Procedures  . DG Knee 4 Views W/Patella Left  . DG Knee 4 Views W/Patella Right    Signed,  Frederico Hamman T. Lucero Auzenne, MD   Outpatient Encounter Medications as of 11/17/2019  Medication Sig  . amLODipine (NORVASC) 5 MG tablet TAKE 1 TABLET BY MOUTH DAILY.  . citalopram (CELEXA) 40 MG tablet TAKE 1 TABLET (40 MG TOTAL) BY MOUTH DAILY.  Marland Kitchen colchicine 0.6 MG tablet Take 1 tablet (0.6 mg total) by mouth 2 (two) times daily. For 7-10 days and then as needed for attacks.  . hydrochlorothiazide (HYDRODIURIL) 25 MG tablet Take 1 tablet (25 mg total) by mouth daily.  . Multiple Vitamin (MULTIVITAMIN) tablet Take 1 tablet by mouth daily.  . pravastatin (PRAVACHOL) 20 MG tablet TAKE 1 TABLET BY MOUTH DAILY WITH EVENING MEAL  . traZODone (DESYREL) 50 MG tablet Take 0.5-1 tablets (25-50 mg total) by mouth at bedtime as needed for sleep.  . [EXPIRED] methylPREDNISolone acetate (DEPO-MEDROL) injection 80 mg    No facility-administered encounter medications on file as of 11/17/2019.

## 2019-12-06 ENCOUNTER — Other Ambulatory Visit: Payer: Self-pay | Admitting: Family Medicine

## 2019-12-06 MED FILL — traZODone HCL 50 MG TABS: 50 | 90 days supply | Qty: 90 | Fill #1

## 2019-12-06 MED FILL — AMLODIPINE BESYLATE 5 MG TA: 5 | 90 days supply | Qty: 90 | Fill #1

## 2019-12-06 MED FILL — PRAVASTATIN SODIUM 20 MG TA: 20 | 90 days supply | Qty: 90 | Fill #0

## 2019-12-06 NOTE — Telephone Encounter (Signed)
Last ov 09/20/19 Last fill 09/06/19  #90/0

## 2020-01-24 MED FILL — HYDROCHLOROTHIAZIDE 25 MG T: 25 | 90 days supply | Qty: 90 | Fill #1

## 2020-02-09 MED FILL — CITALOPRAM HBR 40 MG TABLET: 40 | 90 days supply | Qty: 90 | Fill #1

## 2020-03-02 MED FILL — traZODone HCL 50 MG TABS: 50 | 90 days supply | Qty: 90 | Fill #2

## 2020-03-08 ENCOUNTER — Other Ambulatory Visit: Payer: Self-pay

## 2020-03-08 MED ORDER — PRAVASTATIN SODIUM 20 MG PO TABS: ORAL_TABLET | ORAL | 0 refills | Status: DC

## 2020-03-08 MED FILL — PRAVASTATIN SODIUM 20 MG TA: 20 | 90 days supply | Qty: 90 | Fill #0

## 2020-04-06 ENCOUNTER — Encounter: Payer: Self-pay | Admitting: Internal Medicine

## 2020-04-06 MED ORDER — AMLODIPINE BESYLATE 5 MG PO TABS: 5.0000 mg | ORAL_TABLET | Freq: Every day | ORAL | 0 refills | Status: DC

## 2020-04-07 MED FILL — AMLODIPINE BESYLATE 5 MG TA: 5 | 30 days supply | Qty: 30 | Fill #0

## 2020-04-11 ENCOUNTER — Other Ambulatory Visit: Payer: Self-pay

## 2020-04-17 ENCOUNTER — Encounter: Payer: No Typology Code available for payment source | Admitting: Internal Medicine

## 2020-04-18 MED FILL — HYDROCHLOROTHIAZIDE 25 MG T: 25 | 90 days supply | Qty: 90 | Fill #2

## 2020-05-04 ENCOUNTER — Encounter: Payer: Self-pay | Admitting: Internal Medicine

## 2020-05-04 ENCOUNTER — Other Ambulatory Visit: Payer: Self-pay | Admitting: Internal Medicine

## 2020-05-04 MED FILL — CITALOPRAM HBR 40 MG TABLET: 40 | 90 days supply | Qty: 90 | Fill #2

## 2020-05-05 MED ORDER — AMLODIPINE BESYLATE 5 MG PO TABS: 5.0000 mg | ORAL_TABLET | Freq: Every day | ORAL | 0 refills | Status: DC

## 2020-05-17 MED FILL — AMLODIPINE BESYLATE 5 MG TA: 5 | 30 days supply | Qty: 30 | Fill #0

## 2020-05-18 ENCOUNTER — Other Ambulatory Visit: Payer: Self-pay

## 2020-05-18 ENCOUNTER — Encounter: Payer: Self-pay | Admitting: Internal Medicine

## 2020-05-18 ENCOUNTER — Ambulatory Visit (INDEPENDENT_AMBULATORY_CARE_PROVIDER_SITE_OTHER): Payer: No Typology Code available for payment source | Admitting: Internal Medicine

## 2020-05-18 VITALS — BP 126/82 | HR 59 | Temp 97.9°F | Ht 67.33 in | Wt 202.0 lb

## 2020-05-18 DIAGNOSIS — E7800 Pure hypercholesterolemia, unspecified: Secondary | ICD-10-CM

## 2020-05-18 DIAGNOSIS — F3289 Other specified depressive episodes: Secondary | ICD-10-CM | POA: Diagnosis not present

## 2020-05-18 DIAGNOSIS — I10 Essential (primary) hypertension: Secondary | ICD-10-CM

## 2020-05-18 DIAGNOSIS — F5101 Primary insomnia: Secondary | ICD-10-CM

## 2020-05-18 DIAGNOSIS — M10079 Idiopathic gout, unspecified ankle and foot: Secondary | ICD-10-CM

## 2020-05-18 DIAGNOSIS — S39012S Strain of muscle, fascia and tendon of lower back, sequela: Secondary | ICD-10-CM

## 2020-05-18 DIAGNOSIS — M109 Gout, unspecified: Secondary | ICD-10-CM | POA: Insufficient documentation

## 2020-05-18 DIAGNOSIS — G7000 Myasthenia gravis without (acute) exacerbation: Secondary | ICD-10-CM

## 2020-05-18 MED ORDER — CYCLOBENZAPRINE HCL 5 MG PO TABS: 5.0000 mg | ORAL_TABLET | Freq: Three times a day (TID) | ORAL | 0 refills | Status: DC | PRN

## 2020-05-18 NOTE — Assessment & Plan Note (Signed)
Continue Citalopram Will monitor

## 2020-05-18 NOTE — Assessment & Plan Note (Signed)
Continue Trazadone

## 2020-05-18 NOTE — Assessment & Plan Note (Signed)
Will check uric acid annually Continue Colchicine as needed

## 2020-05-18 NOTE — Assessment & Plan Note (Signed)
Continue Amlodipine and HCTZ Will monitor

## 2020-05-18 NOTE — Patient Instructions (Signed)
Muscle Strain A muscle strain is an injury that happens when a muscle is stretched longer than normal. This can happen during a fall, sports, or lifting. This can tear some muscle fibers. Usually, recovery from muscle strain takes 1-2 weeks. Complete healing normally takes 5-6 weeks. This condition is first treated with PRICE therapy. This involves:  Protecting your muscle from being injured again.  Resting your injured muscle.  Icing your injured muscle.  Applying pressure (compression) to your injured muscle. This may be done with a splint or elastic bandage.  Raising (elevating) your injured muscle. Your doctor may also recommend medicine for pain. Follow these instructions at home: If you have a splint:  Wear the splint as told by your doctor. Take it off only as told by your doctor.  Loosen the splint if your fingers or toes tingle, get numb, or turn cold and blue.  Keep the splint clean.  If the splint is not waterproof: ? Do not let it get wet. ? Cover it with a watertight covering when you take a bath or a shower. Managing pain, stiffness, and swelling   If directed, put ice on your injured area. ? If you have a removable splint, take it off as told by your doctor. ? Put ice in a plastic bag. ? Place a towel between your skin and the bag. ? Leave the ice on for 20 minutes, 2-3 times a day.  Move your fingers or toes often. This helps to avoid stiffness and lessen swelling.  Raise your injured area above the level of your heart while you are sitting or lying down.  Wear an elastic bandage as told by your doctor. Make sure it is not too tight. General instructions  Take over-the-counter and prescription medicines only as told by your doctor.  Limit your activity. Rest your injured muscle as told by your doctor. Your doctor may say that gentle movements are okay.  If physical therapy was prescribed, do exercises as told by your doctor.  Do not put pressure on any  part of the splint until it is fully hardened. This may take many hours.  Do not use any products that contain nicotine or tobacco, such as cigarettes and e-cigarettes. These can delay bone healing. If you need help quitting, ask your doctor.  Warm up before you exercise. This helps to prevent more muscle strains.  Ask your doctor when it is safe to drive if you have a splint.  Keep all follow-up visits as told by your doctor. This is important. Contact a doctor if:  You have more pain or swelling in your injured area. Get help right away if:  You have any of these problems in your injured area: ? You have numbness. ? You have tingling. ? You lose a lot of strength. Summary  A muscle strain is an injury that happens when a muscle is stretched longer than normal.  This condition is first treated with PRICE therapy. This includes protecting, resting, icing, adding pressure, and raising your injury.  Limit your activity. Rest your injured muscle as told by your doctor. Your doctor may say that gentle movements are okay.  Warm up before you exercise. This helps to prevent more muscle strains. This information is not intended to replace advice given to you by your health care provider. Make sure you discuss any questions you have with your health care provider. Document Revised: 01/28/2019 Document Reviewed: 01/08/2017 Elsevier Patient Education  2020 [employer]  

## 2020-05-18 NOTE — Assessment & Plan Note (Signed)
No issues

## 2020-05-18 NOTE — Assessment & Plan Note (Signed)
Consider holding Pravastatin x 2 weeks to see if joint pain improves Encouraged low fat diet

## 2020-05-18 NOTE — Progress Notes (Signed)
HPI  Patient presents the clinic today to establish for management of the conditions listed below.  She is transferring care from Dr. Deborra Medina.  Depression: Chronic, managed on Citalopram.  She is not currently seeing a therapist.  She denies anxiety, SI/HI.  HTN: Her BP today is 126/82.  She is taking Amlodipine and HCTZ as prescribed.  ECG from 11/2016 reviewed.  HLD: Her last LDL was 89, 09/2019.  She is having some joint pain, Pravastatin.  She tries to consume a low-fat diet  Myasthenia Gravis: s/p thymectomy. No issues. She is not seeing a spealist for this.  Insomnia: She has trouble staying asleep.  She is taking Trazodone as prescribed.  There is no sleep study on file.  Gout: Her last flare was 2 months ago. She takes Colchicine as needed with some relief.  She also reports low back pain. This started yesterday after she tried to lift something heavy. She describes the pain as tightness. The pain radiates into her hips. She denies numbness, tingling or weakness of the lower extremities.  Flu: 09/2019 Tetanus: 03/2016 Covid: 2 shots Pap Smear: 05/2018 Mammogram: 04/2020 Colon Screening: 10/2019 Vision Screening: as needed Dentist: biannually  Past Medical History:  Diagnosis Date  . Allergy    mild - no meds   . Anemia    with twin pregnancy   . Depression   . Dyspnea    slight sob when lying flat  . 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 Hyperlipidemia   . Hypertension   . Myasthenia gravis ([place])    dx 2005   s/p  thymectomy 2006--  per pt no other symtpoms since  . Neck stiffness   . Palpitations   . Transfusion history    s/p hysterectomy. pt states she received 9 pints of blood  . Vaginal intraepithelial neoplasia III (VAIN III)     Current Outpatient Medications  Medication Sig Dispense Refill  . amLODipine (NORVASC) 5 MG tablet Take 1 tablet (5 mg total) by mouth daily. 30 tablet 0  .  citalopram (CELEXA) 40 MG tablet TAKE 1 TABLET (40 MG TOTAL) BY MOUTH DAILY. 90 tablet 3  . colchicine 0.6 MG tablet Take 1 tablet (0.6 mg total) by mouth 2 (two) times daily. For 7-10 days and then as needed for attacks. 60 tablet 0  . hydrochlorothiazide (HYDRODIURIL) 25 MG tablet Take 1 tablet (25 mg total) by mouth daily. 90 tablet 3  . Multiple Vitamin (MULTIVITAMIN) tablet Take 1 tablet by mouth daily.    . pravastatin (PRAVACHOL) 20 MG tablet Take 1qd (Plz sched appt with new provider for future fills) 90 tablet 0  . traZODone (DESYREL) 50 MG tablet Take 0.5-1 tablets (25-50 mg total) by mouth at bedtime as needed for sleep. 90 tablet 3   No current facility-administered medications for this visit.    Allergies  Allergen Reactions  . Penicillins Rash    - has tolerated cephalosporins    Family History  Problem Relation Age of Onset  . Cancer Mother 46       breast CA  . Hypertension Mother   . Diabetes Mother   . Breast cancer Mother   . Cancer Father 82       colon CA  . Alcohol abuse Father   . Hypertension Father   . Stroke Father   . Colon cancer Father 48  . Heart disease Brother 26  MI  . Colon polyps Brother   . Colon polyps Sister   . Colon polyps Sister   . Colon polyps Brother   . Esophageal cancer Neg Hx   . Rectal cancer Neg Hx   . Stomach cancer Neg Hx     Social History   Socioeconomic History  . Marital status: Single    Spouse name: Not on file  . Number of children: Not on file  . Years of education: Not on file  . Highest education level: Not on file  Occupational History  . Not on file  Tobacco Use  . Smoking status: Never Smoker  . Smokeless tobacco: Never Used  Substance and Sexual Activity  . Alcohol use: Yes    Comment: rarely  . Drug use: No  . Sexual activity: Not on file  Other Topics Concern  . Not on file  Social History Narrative   Married, twin daughters born 5, son born 1988   Works at [employer] in Engineer, mining.    Social Determinants of Health   Financial Resource Strain:   . Difficulty of Paying Living Expenses:   Food Insecurity:   . Worried About Charity fundraiser in the Last Year:   . Arboriculturist in the Last Year:   Transportation Needs:   . Film/video editor (Medical):   Marland Kitchen Lack of Transportation (Non-Medical):   Physical Activity:   . Days of Exercise per Week:   . Minutes of Exercise per Session:   Stress:   . Feeling of Stress :   Social Connections:   . Frequency of Communication with Friends and Family:   . Frequency of Social Gatherings with Friends and Family:   . Attends Religious Services:   . Active Member of Clubs or Organizations:   . Attends Archivist Meetings:   Marland Kitchen Marital Status:   Intimate Partner Violence:   . Fear of Current or Ex-Partner:   . Emotionally Abused:   Marland Kitchen Physically Abused:   . Sexually Abused:     ROS:  Constitutional: Denies fever, malaise, fatigue, headache or abrupt weight changes.  HEENT: Denies eye pain, eye redness, ear pain, ringing in the ears, wax buildup, runny nose, nasal congestion, bloody nose, or sore throat. Respiratory: Denies difficulty breathing, shortness of breath, cough or sputum production.   Cardiovascular: Denies chest pain, chest tightness, palpitations or swelling in the hands or feet.  Gastrointestinal: Denies abdominal pain, bloating, constipation, diarrhea or blood in the stool.  GU: Denies frequency, urgency, pain with urination, blood in urine, odor or discharge. Musculoskeletal: Pt reports low back pain, bilateral knee pain. Denies decrease in range of motion, difficulty with gait, or joint pain and swelling.  Skin: Denies redness, rashes, lesions or ulcercations.  Neurological: Patient reports insomnia.  Denies dizziness, difficulty with memory, difficulty with speech or problems with balance and coordination.  Psych: Patient has a history of depression.  Denies anxiety,  SI/HI.  No other  specific complaints in a complete review of systems (except as listed in HPI above).  PE:  BP 126/82   Pulse (!) 59   Temp 97.9 F (36.6 C) (Temporal)   Ht 5' 7.33" (1.71 m)   Wt 202 lb (91.6 kg)   SpO2 98%   BMI 31.33 kg/m   Wt Readings from Last 3 Encounters:  11/17/19 205 lb 8 oz (93.2 kg)  11/01/19 205 lb 12.8 oz (93.4 kg)  10/18/19 205 lb 12.8 oz (93.4 kg)  General: Appears her stated age, obese, in NAD. HEENT: Head: normal shape and size;  Cardiovascular: Normal rate and rhythm. S1,S2 noted.  No murmur, rubs or gallops noted.  Pulmonary/Chest: Normal effort and positive vesicular breath sounds. No respiratory distress. No wheezes, rales or ronchi noted.  Musculoskeletal: Normal extension and rotation. Pain with flexion. No bony tenderness noted over the lumbar spine. Pain with palpation of bilateral paralumbar muscles. Strength 5/5 BUE/BLE. No signs of joint swelling. No difficulty with gait.  Neurological: Alert and oriented. Psychiatric: Mood and affect normal. Behavior is normal. Judgment and thought content normal.     BMET    Component Value Date/Time   NA 141 09/20/2019 1004   K 3.0 (L) 09/20/2019 1004   CL 101 09/20/2019 1004   CO2 29 09/20/2019 1004   GLUCOSE 99 09/20/2019 1004   BUN 12 09/20/2019 1004   CREATININE 0.73 09/20/2019 1004   CREATININE 0.88 03/04/2014 1710   CALCIUM 9.6 09/20/2019 1004   GFRNONAA >60 11/05/2010 1319   GFRAA  11/05/2010 1319    >60        The eGFR has been calculated using the MDRD equation. This calculation has not been validated in all clinical situations. eGFR's persistently <60 mL/min signify possible Chronic Kidney Disease.    Lipid Panel     Component Value Date/Time   CHOL 169 09/20/2019 1004   TRIG 192.0 (H) 09/20/2019 1004   HDL 41.30 09/20/2019 1004   CHOLHDL 4 09/20/2019 1004   VLDL 38.4 09/20/2019 1004   LDLCALC 89 09/20/2019 1004    CBC    Component Value Date/Time   WBC 7.7 09/20/2019 1004    RBC 4.91 09/20/2019 1004   HGB 13.9 09/20/2019 1004   HCT 41.0 09/20/2019 1004   PLT 286.0 09/20/2019 1004   MCV 83.4 09/20/2019 1004   MCH 28.8 03/04/2014 1710   MCHC 33.8 09/20/2019 1004   RDW 13.9 09/20/2019 1004   LYMPHSABS 1.9 09/20/2019 1004   MONOABS 0.5 09/20/2019 1004   EOSABS 0.2 09/20/2019 1004   BASOSABS 0.0 09/20/2019 1004    Hgb A1C No results found for: HGBA1C   Assessment and Plan:  Muscle Strain, Low Back:  RX for Flexeril 5 mg TID prn- sedation caution given Encouraged NSAID's OTC, stretching and heat  RTC in 1 year, sooner if needed [name], NP This visit occurred during the SARS-CoV-2 public health emergency.  Safety protocols were in place, including screening questions prior to the visit, additional usage of staff PPE, and extensive cleaning of exam room while observing appropriate contact time as indicated for disinfecting solutions.

## 2020-06-12 ENCOUNTER — Other Ambulatory Visit: Payer: Self-pay | Admitting: Internal Medicine

## 2020-06-13 ENCOUNTER — Other Ambulatory Visit: Payer: Self-pay | Admitting: Internal Medicine

## 2020-06-15 ENCOUNTER — Other Ambulatory Visit: Payer: Self-pay | Admitting: Internal Medicine

## 2020-06-15 MED FILL — AMLODIPINE BESYLATE 5 MG TA: 5 | 90 days supply | Qty: 90 | Fill #0

## 2020-06-19 ENCOUNTER — Encounter: Payer: Self-pay | Admitting: Internal Medicine

## 2020-06-20 ENCOUNTER — Other Ambulatory Visit: Payer: Self-pay | Admitting: Internal Medicine

## 2020-06-20 MED FILL — traZODone HCL 50 MG TABS: 50 | 90 days supply | Qty: 90 | Fill #0

## 2020-06-20 MED FILL — PRAVASTATIN SODIUM 20 MG TA: 20 | 90 days supply | Qty: 90 | Fill #0

## 2020-07-20 MED FILL — HYDROCHLOROTHIAZIDE 25 MG T: 25 | 90 days supply | Qty: 90 | Fill #3

## 2020-08-09 MED FILL — CITALOPRAM HBR 40 MG TABLET: 40 | 90 days supply | Qty: 90 | Fill #3

## 2020-09-04 MED FILL — AMLODIPINE BESYLATE 5 MG TA: 5 | 90 days supply | Qty: 90 | Fill #1

## 2020-09-04 MED FILL — traZODone HCL 50 MG TABS: 50 | 90 days supply | Qty: 90 | Fill #1

## 2020-09-04 MED FILL — PRAVASTATIN SODIUM 20 MG TA: 20 | 90 days supply | Qty: 90 | Fill #1

## 2020-10-04 ENCOUNTER — Ambulatory Visit (INDEPENDENT_AMBULATORY_CARE_PROVIDER_SITE_OTHER): Payer: No Typology Code available for payment source

## 2020-10-04 ENCOUNTER — Other Ambulatory Visit: Payer: Self-pay

## 2020-10-04 DIAGNOSIS — Z23 Encounter for immunization: Secondary | ICD-10-CM

## 2020-10-11 ENCOUNTER — Encounter: Payer: Self-pay | Admitting: Internal Medicine

## 2020-10-16 ENCOUNTER — Ambulatory Visit: Payer: No Typology Code available for payment source | Admitting: Internal Medicine

## 2020-10-19 ENCOUNTER — Other Ambulatory Visit: Payer: Self-pay | Admitting: Internal Medicine

## 2020-10-19 ENCOUNTER — Ambulatory Visit (INDEPENDENT_AMBULATORY_CARE_PROVIDER_SITE_OTHER): Payer: No Typology Code available for payment source | Admitting: Internal Medicine

## 2020-10-19 ENCOUNTER — Other Ambulatory Visit: Payer: Self-pay

## 2020-10-19 ENCOUNTER — Encounter: Payer: Self-pay | Admitting: Internal Medicine

## 2020-10-19 VITALS — BP 116/68 | HR 62 | Temp 99.3°F | Resp 16 | Ht 67.0 in | Wt 191.0 lb

## 2020-10-19 DIAGNOSIS — I10 Essential (primary) hypertension: Secondary | ICD-10-CM | POA: Diagnosis not present

## 2020-10-19 DIAGNOSIS — M109 Gout, unspecified: Secondary | ICD-10-CM | POA: Diagnosis not present

## 2020-10-19 DIAGNOSIS — F32A Depression, unspecified: Secondary | ICD-10-CM

## 2020-10-19 DIAGNOSIS — F419 Anxiety disorder, unspecified: Secondary | ICD-10-CM

## 2020-10-19 MED ORDER — ALPRAZOLAM 0.25 MG PO TABS: 0.2500 mg | ORAL_TABLET | Freq: Every day | ORAL | 0 refills | Status: DC | PRN

## 2020-10-19 MED ORDER — COLCHICINE 0.6 MG PO TABS: 0.6000 mg | ORAL_TABLET | Freq: Two times a day (BID) | ORAL | 0 refills | Status: DC

## 2020-10-19 MED ORDER — HYDROCHLOROTHIAZIDE 25 MG PO TABS: 25.0000 mg | ORAL_TABLET | Freq: Every day | ORAL | 0 refills | Status: DC

## 2020-10-19 MED FILL — COLCHICINE 0.6 MG TABS: 0.6 | 15 days supply | Qty: 30 | Fill #0

## 2020-10-19 MED FILL — HYDROCHLOROTHIAZIDE 25 MG T: 25 | 90 days supply | Qty: 90 | Fill #0

## 2020-10-19 NOTE — Patient Instructions (Signed)

## 2020-10-19 NOTE — Progress Notes (Signed)
Subjective:    Patient ID: Regina Huber, female    DOB: 08-10-1961, 59 y.o.   MRN: [id number]  HPI  Pt presents to the clinic today for follow up of anxiety and depression. She reports she has been having a really hard time lately, her daughter recently got sent to rehab. She is having a really hard time focusing due to worsening anxiety. She is taking Citalopram as prescribed. She uses Trazadone as needed for sleep with good results. She is not currently seeing a therapist. She has taken Xanax in the past and would like a RX for this to use sparingly.   She needs a refill of HCTZ and Colchicine as well.  Review of Systems  Past Medical History:  Diagnosis Date  . Allergy    mild - no meds   . Anemia    with twin pregnancy   . Depression   . Dyspnea    slight sob when lying flat  . 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 Hyperlipidemia   . Hypertension   . Myasthenia gravis ([place])    dx 2005   s/p  thymectomy 2006--  per pt no other symtpoms since  . Neck stiffness   . Palpitations   . Transfusion history    s/p hysterectomy. pt states she received 9 pints of blood  . Vaginal intraepithelial neoplasia III (VAIN III)     Current Outpatient Medications  Medication Sig Dispense Refill  . amLODipine (NORVASC) 5 MG tablet TAKE 1 TABLET (5 MG TOTAL) BY MOUTH DAILY. 90 tablet 2  . citalopram (CELEXA) 40 MG tablet TAKE 1 TABLET (40 MG TOTAL) BY MOUTH DAILY. 90 tablet 3  . colchicine 0.6 MG tablet Take 1 tablet (0.6 mg total) by mouth 2 (two) times daily. For 7-10 days and then as needed for attacks. 60 tablet 0  . cyclobenzaprine (FLEXERIL) 5 MG tablet Take 1 tablet (5 mg total) by mouth 3 (three) times daily as needed for muscle spasms. 15 tablet 0  . hydrochlorothiazide (HYDRODIURIL) 25 MG tablet Take 1 tablet (25 mg total) by mouth daily. 90 tablet 3  . Multiple Vitamin (MULTIVITAMIN) tablet Take 1 tablet by mouth  daily.    . pravastatin (PRAVACHOL) 20 MG tablet TAKE 1 TABLET BY MOUTH DAILY 90 tablet 2  . traZODone (DESYREL) 50 MG tablet TAKE 1/2 TO 1 TABLET BY MOUTH AT BEDTIME AS NEEDED FOR SLEEP 90 tablet 2   No current facility-administered medications for this visit.    Allergies  Allergen Reactions  . Penicillins Rash    - has tolerated cephalosporins    Family History  Problem Relation Age of Onset  . Hypertension Mother   . Diabetes Mother   . Breast cancer Mother   . Alcohol abuse Father   . Hypertension Father   . Stroke Father   . Colon cancer Father 30  . Heart disease Brother 34       MI  . Colon polyps Brother   . Colon polyps Sister   . Colon polyps Sister   . Colon polyps Brother   . Esophageal cancer Neg Hx   . Rectal cancer Neg Hx   . Stomach cancer Neg Hx     Social History   Socioeconomic History  . Marital status: Single    Spouse name: Not on file  . Number of children: Not on file  . Years of  education: Not on file  . Highest education level: Not on file  Occupational History  . Not on file  Tobacco Use  . Smoking status: Never Smoker  . Smokeless tobacco: Never Used  Vaping Use  . Vaping Use: Never used  Substance and Sexual Activity  . Alcohol use: Yes    Comment: rarely  . Drug use: No  . Sexual activity: Not on file  Other Topics Concern  . Not on file  Social History Narrative   Married, twin daughters born 20, son born 1988   Works at [employer] in Engineer, mining.   Social Determinants of Health   Financial Resource Strain:   . Difficulty of Paying Living Expenses: Not on file  Food Insecurity:   . Worried About Charity fundraiser in the Last Year: Not on file  . Ran Out of Food in the Last Year: Not on file  Transportation Needs:   . Lack of Transportation (Medical): Not on file  . Lack of Transportation (Non-Medical): Not on file  Physical Activity:   . Days of Exercise per Week: Not on file  . Minutes of Exercise per Session: Not on  file  Stress:   . Feeling of Stress : Not on file  Social Connections:   . Frequency of Communication with Friends and Family: Not on file  . Frequency of Social Gatherings with Friends and Family: Not on file  . Attends Religious Services: Not on file  . Active Member of Clubs or Organizations: Not on file  . Attends Archivist Meetings: Not on file  . Marital Status: Not on file  Intimate Partner Violence:   . Fear of Current or Ex-Partner: Not on file  . Emotionally Abused: Not on file  . Physically Abused: Not on file  . Sexually Abused: Not on file     Constitutional: Denies fever, malaise, fatigue, headache or abrupt weight changes.  Respiratory: Denies difficulty breathing, shortness of breath, cough or sputum production.   Cardiovascular: Denies chest pain, chest tightness, palpitations or swelling in the hands or feet.  Neurological: Pt reports insomnia. Denies dizziness, difficulty with memory, difficulty with speech or problems with balance and coordination.  Psych: Pt reports anxiety and depression. Denies anxiety, SI/HI.  No other specific complaints in a complete review of systems (except as listed in HPI above).     Objective:   Physical Exam  BP 116/68 (BP Location: Left Arm, Patient Position: Sitting, Cuff Size: Small)   Pulse 62   Temp 99.3 F (37.4 C) (Oral)   Resp 16   Ht 5' 7"  (1.702 m)   Wt 191 lb (86.6 kg)   SpO2 98%   BMI 29.91 kg/m   Wt Readings from Last 3 Encounters:  05/18/20 202 lb (91.6 kg)  11/17/19 205 lb 8 oz (93.2 kg)  11/01/19 205 lb 12.8 oz (93.4 kg)    General: Appears her stated age, well developed, well nourished in NAD. Cardiovascular: Normal rate and rhythm.  Pulmonary/Chest: Normal effort and positive vesicular breath sounds.   Neurological: Alert and oriented.  Psychiatric: Mood and affect mildly flat. Behavior is normal. Judgment and thought content normal.    BMET    Component Value Date/Time   NA 141  09/20/2019 1004   K 3.0 (L) 09/20/2019 1004   CL 101 09/20/2019 1004   CO2 29 09/20/2019 1004   GLUCOSE 99 09/20/2019 1004   BUN 12 09/20/2019 1004   CREATININE 0.73 09/20/2019 1004   CREATININE  0.88 03/04/2014 1710   CALCIUM 9.6 09/20/2019 1004   GFRNONAA >60 11/05/2010 1319   GFRAA  11/05/2010 1319    >60        The eGFR has been calculated using the MDRD equation. This calculation has not been validated in all clinical situations. eGFR's persistently <60 mL/min signify possible Chronic Kidney Disease.    Lipid Panel     Component Value Date/Time   CHOL 169 09/20/2019 1004   TRIG 192.0 (H) 09/20/2019 1004   HDL 41.30 09/20/2019 1004   CHOLHDL 4 09/20/2019 1004   VLDL 38.4 09/20/2019 1004   LDLCALC 89 09/20/2019 1004    CBC    Component Value Date/Time   WBC 7.7 09/20/2019 1004   RBC 4.91 09/20/2019 1004   HGB 13.9 09/20/2019 1004   HCT 41.0 09/20/2019 1004   PLT 286.0 09/20/2019 1004   MCV 83.4 09/20/2019 1004   MCH 28.8 03/04/2014 1710   MCHC 33.8 09/20/2019 1004   RDW 13.9 09/20/2019 1004   LYMPHSABS 1.9 09/20/2019 1004   MONOABS 0.5 09/20/2019 1004   EOSABS 0.2 09/20/2019 1004   BASOSABS 0.0 09/20/2019 1004    Hgb A1C No results found for: HGBA1C         Assessment & Plan:     [name], NP This visit occurred during the SARS-CoV-2 public health emergency.  Safety protocols were in place, including screening questions prior to the visit, additional usage of staff PPE, and extensive cleaning of exam room while observing appropriate contact time as indicated for disinfecting solutions.

## 2020-10-19 NOTE — Assessment & Plan Note (Signed)
HCTZ refilled Continue Amlodipine Will monitor

## 2020-10-19 NOTE — Assessment & Plan Note (Signed)
Continue Colchicine prn, refilled today

## 2020-10-19 NOTE — Assessment & Plan Note (Signed)
Deteriorated due to family stress Continue Citalopram and Trazadone RX for Xanax 0.25 mg daily prn- use sparingly, addiction caution given She declines referral for therapy at this time  Return precautions discussed

## 2020-11-08 LAB — HM PAP SMEAR

## 2020-11-08 LAB — HM MAMMOGRAPHY

## 2020-11-15 ENCOUNTER — Encounter: Payer: Self-pay | Admitting: Internal Medicine

## 2020-11-15 ENCOUNTER — Other Ambulatory Visit: Payer: Self-pay | Admitting: Internal Medicine

## 2020-11-15 MED ORDER — CITALOPRAM HYDROBROMIDE 40 MG PO TABS: 40.0000 mg | ORAL_TABLET | Freq: Every day | ORAL | 0 refills | Status: DC

## 2020-11-15 MED FILL — CITALOPRAM HBR 40 MG TABLET: 40 | 90 days supply | Qty: 90 | Fill #0

## 2020-12-14 ENCOUNTER — Other Ambulatory Visit: Payer: Self-pay | Admitting: Internal Medicine

## 2020-12-14 DIAGNOSIS — F419 Anxiety disorder, unspecified: Secondary | ICD-10-CM

## 2020-12-14 MED FILL — traZODone HCL 50 MG TABS: 50 | 90 days supply | Qty: 90 | Fill #2

## 2020-12-14 MED FILL — AMLODIPINE BESYLATE 5 MG TA: 5 | 90 days supply | Qty: 90 | Fill #2

## 2020-12-14 MED FILL — PRAVASTATIN SODIUM 20 MG TA: 20 | 90 days supply | Qty: 90 | Fill #2

## 2020-12-18 ENCOUNTER — Encounter: Payer: Self-pay | Admitting: Internal Medicine

## 2020-12-18 ENCOUNTER — Other Ambulatory Visit: Payer: Self-pay | Admitting: Internal Medicine

## 2020-12-18 DIAGNOSIS — F419 Anxiety disorder, unspecified: Secondary | ICD-10-CM

## 2020-12-18 NOTE — Telephone Encounter (Signed)
Last filled 10/19/2020...Marland Kitchen please advise upcoming appt CPE 01/10/21

## 2020-12-30 ENCOUNTER — Encounter: Payer: Self-pay | Admitting: Internal Medicine

## 2021-01-02 ENCOUNTER — Telehealth: Payer: No Typology Code available for payment source | Admitting: Family

## 2021-01-02 DIAGNOSIS — U071 COVID-19: Secondary | ICD-10-CM

## 2021-01-02 DIAGNOSIS — B370 Candidal stomatitis: Secondary | ICD-10-CM

## 2021-01-02 MED ORDER — BENZONATATE 100 MG PO CAPS: 100.0000 mg | ORAL_CAPSULE | Freq: Three times a day (TID) | ORAL | 0 refills | Status: DC | PRN

## 2021-01-02 MED ORDER — DEXAMETHASONE 6 MG PO TABS: 6.0000 mg | ORAL_TABLET | Freq: Every day | ORAL | 0 refills | Status: DC

## 2021-01-02 MED ORDER — ALBUTEROL SULFATE HFA 108 (90 BASE) MCG/ACT IN AERS: 2.0000 | INHALATION_SPRAY | Freq: Four times a day (QID) | RESPIRATORY_TRACT | 0 refills | Status: DC | PRN

## 2021-01-02 MED ORDER — NYSTATIN 100000 UNIT/ML MT SUSP: 5.0000 mL | Freq: Four times a day (QID) | OROMUCOSAL | 0 refills | Status: DC

## 2021-01-02 NOTE — Progress Notes (Signed)
E-Visit for Corona Virus Screening  We are sorry you are not feeling well. We are here to help!  You have tested positive for COVID-19, meaning that you were infected with the novel coronavirus and could give the virus to others.  It is vitally important that you stay home so you do not spread it to others.      Please continue isolation at home, for at least 10 days since the start of your symptoms and until you have had 24 hours with no fever (without taking a fever reducer) and with improving of symptoms.  If you have no symptoms but tested positive (or all symptoms resolve after 5 days and you have no fever) you can leave your house but continue to wear a mask around others for an additional 5 days. If you have a fever,continue to stay home until you have had 24 hours of no fever. Most cases improve 5-10 days from onset but we have seen a small number of patients who have gotten worse after the 10 days.  Please be sure to watch for worsening symptoms and remain taking the proper precautions.   Go to the nearest hospital ED for assessment if fever/cough/breathlessness are severe or illness seems like a threat to life.    The following symptoms may appear 2-14 days after exposure: . Fever . Cough . Shortness of breath or difficulty breathing . Chills . Repeated shaking with chills . Muscle pain . Headache . Sore throat . New loss of taste or smell . Fatigue . Congestion or runny nose . Nausea or vomiting . Diarrhea  You have been enrolled in [place] for COVID-19. Daily you will receive a questionnaire within the [facility] website. Our COVID-19 response team will be monitoring your responses daily.  You can use medication such as A prescription cough medication called Tessalon Perles 100 mg. You may take 1-2 capsules every 8 hours as needed for cough, A prescription inhaler called Albuterol MDI 90 mcg /actuation 2 puffs every 4 hours as needed for shortness of breath,  wheezing, cough and A prescription for Fluticasone nasal spray 2 sprays in each nostril one time per day and dexamethasone 6 mg twice a day for 7 days.   I have also sent in nystatin mouth wash.    You may also take acetaminophen (Tylenol) as needed for fever.  HOME CARE: . Only take medications as instructed by your medical team. . Drink plenty of fluids and get plenty of rest. . A steam or ultrasonic humidifier can help if you have congestion.   GET HELP RIGHT AWAY IF YOU HAVE EMERGENCY WARNING SIGNS.  Call 911 or proceed to your closest emergency facility if: . You develop worsening high fever. . Trouble breathing . Bluish lips or face . Persistent pain or pressure in the chest . New confusion . Inability to wake or stay awake . You cough up blood. . Your symptoms become more severe . Inability to hold down food or fluids  This list is not all possible symptoms. Contact your medical provider for any symptoms that are severe or concerning to you.    Your e-visit answers were reviewed by a board certified advanced clinical practitioner to complete your personal care plan.  Depending on the condition, your plan could have included both over the counter or prescription medications.  If there is a problem please reply once you have received a response from your provider.  Your safety is important to [place].  If you have drug allergies check your prescription carefully.    You can use MyChart to ask questions about today's visit, request a non-urgent call back, or ask for a work or school excuse for 24 hours related to this e-Visit. If it has been greater than 24 hours you will need to follow up with your provider, or enter a new e-Visit to address those concerns. You will get an e-mail in the next two days asking about your experience.  I hope that your e-visit has been valuable and will speed your recovery. Thank you for using e-visits.    Approximately 5 minutes was spent documenting  and reviewing patient's chart.

## 2021-01-02 NOTE — Telephone Encounter (Signed)
Please call and offer her a virtual appt to discuss and treat

## 2021-01-04 NOTE — Telephone Encounter (Signed)
LVM to c/b to sch appt

## 2021-01-10 ENCOUNTER — Encounter: Payer: No Typology Code available for payment source | Admitting: Internal Medicine

## 2021-01-16 ENCOUNTER — Other Ambulatory Visit: Payer: Self-pay | Admitting: Internal Medicine

## 2021-01-16 ENCOUNTER — Other Ambulatory Visit: Payer: Self-pay

## 2021-01-16 DIAGNOSIS — F419 Anxiety disorder, unspecified: Secondary | ICD-10-CM

## 2021-01-16 DIAGNOSIS — F32A Depression, unspecified: Secondary | ICD-10-CM

## 2021-01-16 DIAGNOSIS — U071 COVID-19: Secondary | ICD-10-CM

## 2021-01-16 MED ORDER — ALBUTEROL SULFATE HFA 108 (90 BASE) MCG/ACT IN AERS: 2.0000 | INHALATION_SPRAY | Freq: Four times a day (QID) | RESPIRATORY_TRACT | 0 refills | Status: DC | PRN

## 2021-01-16 MED ORDER — ALPRAZOLAM 0.25 MG PO TABS: 0.2500 mg | ORAL_TABLET | Freq: Every day | ORAL | 0 refills | Status: DC | PRN

## 2021-01-16 NOTE — Telephone Encounter (Signed)
Last filled 12/18/2020, due 01/17/2021... please advise....  Pt also req refill on Albuterol inhaler, pt had covid last month

## 2021-01-17 ENCOUNTER — Other Ambulatory Visit: Payer: Self-pay | Admitting: Internal Medicine

## 2021-01-17 MED FILL — HYDROCHLOROTHIAZIDE 25 MG T: 25 | 90 days supply | Qty: 90 | Fill #1

## 2021-01-19 ENCOUNTER — Telehealth: Payer: No Typology Code available for payment source | Admitting: Physician Assistant

## 2021-01-19 DIAGNOSIS — J208 Acute bronchitis due to other specified organisms: Secondary | ICD-10-CM

## 2021-01-19 DIAGNOSIS — B9689 Other specified bacterial agents as the cause of diseases classified elsewhere: Secondary | ICD-10-CM

## 2021-01-19 MED ORDER — PROMETHAZINE-DM 6.25-15 MG/5ML PO SYRP: 2.5000 mL | ORAL_SOLUTION | Freq: Four times a day (QID) | ORAL | 0 refills | Status: DC | PRN

## 2021-01-19 MED ORDER — DOXYCYCLINE HYCLATE 100 MG PO TABS: 100.0000 mg | ORAL_TABLET | Freq: Two times a day (BID) | ORAL | 0 refills | Status: DC

## 2021-01-19 NOTE — Progress Notes (Signed)
We are sorry that you are not feeling well.  Here is how we plan to help!  Based on your presentation I believe you most likely have A cough due to bacteria.  When patients have a fever and a productive cough with a change in color or increased sputum production, we are concerned about bacterial bronchitis.  If left untreated it can progress to pneumonia.  If your symptoms do not improve with your treatment plan it is important that you contact your provider.   I have prescribed Doxycycline 100 mg twice a day for 7 days     In addition you may use A prescription cough medication called promethazine-DM. Please take as directed.    From your responses in the eVisit questionnaire you describe inflammation in the upper respiratory tract which is causing a significant cough.  This is commonly called Bronchitis and has four common causes:    Allergies  Viral Infections  Acid Reflux  Bacterial Infection Allergies, viruses and acid reflux are treated by controlling symptoms or eliminating the cause. An example might be a cough caused by taking certain blood pressure medications. You stop the cough by changing the medication. Another example might be a cough caused by acid reflux. Controlling the reflux helps control the cough.  USE OF BRONCHODILATOR ("RESCUE") INHALERS: There is a risk from using your bronchodilator too frequently.  The risk is that over-reliance on a medication which only relaxes the muscles surrounding the breathing tubes can reduce the effectiveness of medications prescribed to reduce swelling and congestion of the tubes themselves.  Although you feel brief relief from the bronchodilator inhaler, your asthma may actually be worsening with the tubes becoming more swollen and filled with mucus.  This can delay other crucial treatments, such as oral steroid medications. If you need to use a bronchodilator inhaler daily, several times per day, you should discuss this with your provider.   There are probably better treatments that could be used to keep your asthma under control.     HOME CARE . Only take medications as instructed by your medical team. . Complete the entire course of an antibiotic. . Drink plenty of fluids and get plenty of rest. . Avoid close contacts especially the very young and the elderly . Cover your mouth if you cough or cough into your sleeve. . Always remember to wash your hands . A steam or ultrasonic humidifier can help congestion.   GET HELP RIGHT AWAY IF: . You develop worsening fever. . You become short of breath . You cough up blood. . Your symptoms persist after you have completed your treatment plan MAKE SURE YOU   Understand these instructions.  Will watch your condition.  Will get help right away if you are not doing well or get worse.  Your e-visit answers were reviewed by a board certified advanced clinical practitioner to complete your personal care plan.  Depending on the condition, your plan could have included both over the counter or prescription medications. If there is a problem please reply  once you have received a response from your provider. Your safety is important to [place].  If you have drug allergies check your prescription carefully.    You can use MyChart to ask questions about today's visit, request a non-urgent call back, or ask for a work or school excuse for 24 hours related to this e-Visit. If it has been greater than 24 hours you will need to follow up with your provider, or enter  a new e-Visit to address those concerns. You will get an e-mail in the next two days asking about your experience.  I hope that your e-visit has been valuable and will speed your recovery. Thank you for using e-visits.

## 2021-01-19 NOTE — Progress Notes (Signed)
Reviewed EMR in detail. COVID at beginning of month. Follow-up message on 12/30/20 notes she was feeling all better. Today's visit is a new illness.

## 2021-01-19 NOTE — Progress Notes (Signed)
I have spent 5 minutes in review of e-visit questionnaire, review and updating patient chart, medical decision making and response to patient.   Doreene Forrey Cody Baby Stairs, PA-C    

## 2021-01-31 ENCOUNTER — Encounter: Payer: Self-pay | Admitting: Internal Medicine

## 2021-02-09 MED FILL — CITALOPRAM HBR 40 MG TABLET: 40 | 90 days supply | Qty: 90 | Fill #0

## 2021-02-17 ENCOUNTER — Other Ambulatory Visit: Payer: Self-pay | Admitting: Family

## 2021-02-17 DIAGNOSIS — U071 COVID-19: Secondary | ICD-10-CM

## 2021-02-17 DIAGNOSIS — B370 Candidal stomatitis: Secondary | ICD-10-CM

## 2021-02-22 ENCOUNTER — Encounter: Payer: Self-pay | Admitting: Family Medicine

## 2021-02-22 ENCOUNTER — Ambulatory Visit (INDEPENDENT_AMBULATORY_CARE_PROVIDER_SITE_OTHER): Payer: No Typology Code available for payment source | Admitting: Family Medicine

## 2021-02-22 ENCOUNTER — Other Ambulatory Visit: Payer: Self-pay

## 2021-02-22 VITALS — BP 130/80 | HR 70 | Temp 97.6°F | Ht 67.0 in | Wt 204.0 lb

## 2021-02-22 DIAGNOSIS — M722 Plantar fascial fibromatosis: Secondary | ICD-10-CM

## 2021-02-22 NOTE — Progress Notes (Unsigned)
Spencer T. Copland, MD, [place] at [facility] [place], [address]  Phone: [phone]  FAX: [phone]  Regina Huber - 60 y.o. female  MRN [id number]  Date of Birth: 09-19-1961  Date: 02/22/2021  PCP: Jearld Fenton, NP  Referral: Jearld Fenton, NP  Chief Complaint  Patient presents with  . Foot Pain    Left Heel    This visit occurred during the SARS-CoV-2 public health emergency.  Safety protocols were in place, including screening questions prior to the visit, additional usage of staff PPE, and extensive cleaning of exam room while observing appropriate contact time as indicated for disinfecting solutions.   Subjective:   This 60 y.o. female patient presents with a 6-8 week long history of heel pain. This is notable for worsening pain first thing in the morning when arising and standing after sitting.   Heel is really bothering her.  Stretches and exercises bothers all the time. 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  Will walk as much as she can during the day.    She has not had any specific injury, and this started to develop fairly slowly.  Achilles stretching Moves her foot around as she is able Heating massage - manual    Prior foot or ankle fractures: none Prior operations: none Orthotics or bracing: She has tried some over-the-counter orthotics Medications: She has tried ibuprofen PT or home rehab: Home rehab Night splints: no Ice massage: no Ball massage: no  Metatarsal pain: no   Review of Systems is noted in the HPI, as appropriate  Objective:   Blood pressure 130/80, pulse 70, temperature 97.6 F (36.4 C), temperature source Temporal, height 5\' 7"  (1.702 m), weight 204 lb (92.5 kg), SpO2 97 %.  GEN: No acute distress; alert,appropriate. PULM: Breathing comfortably in no respiratory distress PSYCH: Normally interactive.   Foot:  L Echymosis: no Edema: no ROM: full LE B Gait: heel toe, non-antalgic MT pain: no Callus pattern: none Lateral Mall: NT Medial Mall: NT Talus: NT Navicular: NT Calcaneous: NT Metatarsals: NT 5th MT: NT Phalanges: NT Achilles: NT Plantar Fascia: tender, medial along PF. Pain with forced dorsi Fat Pad: NT Peroneals: NT Post Tib: NT Great Toe: Nml motion Ant Drawer: neg Other foot breakdown: none Long arch: preserved Transverse arch: preserved Hindfoot breakdown: none Sensation: intact  Assessment and Plan:     ICD-10-CM   1. Plantar fasciitis of left foot  M72.2    Anatomy reviewed. Stretching and rehab are critically important to the treatment of PF. Reviewed footwear. Rigid soles have been shown to help with PF. I also gave her an arch binder  Reviewed rehab of stretching and calf raises.  Reviewed rehab from [place] [facility]  Could consider a corticosteroid injection if conservative treatment fails.  Patient Instructions  Please read handouts on Plantar Fascitis.  STRETCHING and Strengthening program critically important.  Strengthening on foot and calf muscles as seen in handout. Heel drops / stretching on a step Calf raises, 2 legged, then 1 legged - pnly when you are able to without pain  Foot massage with tennis ball. Ice massage.  Towel Scrunches: get a towel or hand towel, use toes to pick up and scrunch up the towel. And/or Marble pick-ups, practice picking up marbles with toes and placing into a cup  NEEDS TO BE DONE EVERY DAY  Recommended over  the counter insoles. (Spenco or Hapad)  A rigid shoe with good arch support helps: Dansko (great), Bronwen Betters - a clog like a Dansko generally the best  No easily bendable shoes.   Tuli's heel cups      Follow-up: No follow-ups on file.  No orders of the defined types were placed in this encounter.  No orders of the defined types were placed in this  encounter.   Signed,  Maud Deed. Copland, MD   Patient's Medications  New Prescriptions   No medications on file  Previous Medications   ALBUTEROL (VENTOLIN HFA) 108 (90 BASE) MCG/ACT INHALER    Inhale 2 puffs into the lungs every 6 (six) hours as needed for wheezing or shortness of breath.   ALPRAZOLAM (XANAX) 0.25 MG TABLET    Take 1 tablet (0.25 mg total) by mouth daily as needed. for anxiety   AMLODIPINE (NORVASC) 5 MG TABLET    TAKE 1 TABLET (5 MG TOTAL) BY MOUTH DAILY.   CITALOPRAM (CELEXA) 40 MG TABLET    TAKE 1 TABLET BY MOUTH DAILY   COLCHICINE 0.6 MG TABLET    Take 1 tablet (0.6 mg total) by mouth 2 (two) times daily. For 7-10 days and then as needed for attacks.   HYDROCHLOROTHIAZIDE (HYDRODIURIL) 25 MG TABLET    Take 1 tablet (25 mg total) by mouth daily.   MULTIPLE VITAMIN (MULTIVITAMIN) TABLET    Take 1 tablet by mouth daily.   PRAVASTATIN (PRAVACHOL) 20 MG TABLET    TAKE 1 TABLET BY MOUTH DAILY   TRAZODONE (DESYREL) 50 MG TABLET    TAKE 1/2 TO 1 TABLET BY MOUTH AT BEDTIME AS NEEDED FOR SLEEP  Modified Medications   No medications on file  Discontinued Medications   DOXYCYCLINE (VIBRA-TABS) 100 MG TABLET    Take 1 tablet (100 mg total) by mouth 2 (two) times daily.   PROMETHAZINE-DEXTROMETHORPHAN (PROMETHAZINE-DM) 6.25-15 MG/5ML SYRUP    Take 2.5 mLs by mouth 4 (four) times daily as needed for cough.

## 2021-02-22 NOTE — Patient Instructions (Signed)
Please read handouts on Plantar Fascitis.  STRETCHING and Strengthening program critically important.  Strengthening on foot and calf muscles as seen in handout. Heel drops / stretching on a step Calf raises, 2 legged, then 1 legged - pnly when you are able to without pain  Foot massage with tennis ball. Ice massage.  Towel Scrunches: get a towel or hand towel, use toes to pick up and scrunch up the towel. And/or Marble pick-ups, practice picking up marbles with toes and placing into a cup  NEEDS TO BE DONE EVERY DAY  Recommended over the counter insoles. (Spenco or Hapad)  A rigid shoe with good arch support helps: Dansko (great), Bronwen Betters - a clog like a Dansko generally the best  No easily bendable shoes.   Tuli's heel cups

## 2021-02-23 ENCOUNTER — Encounter: Payer: Self-pay | Admitting: Family Medicine

## 2021-03-01 ENCOUNTER — Encounter: Payer: No Typology Code available for payment source | Admitting: Internal Medicine

## 2021-03-05 ENCOUNTER — Other Ambulatory Visit (HOSPITAL_BASED_OUTPATIENT_CLINIC_OR_DEPARTMENT_OTHER): Payer: Self-pay

## 2021-03-16 ENCOUNTER — Other Ambulatory Visit: Payer: Self-pay | Admitting: Internal Medicine

## 2021-03-16 MED ORDER — TRAZODONE HCL 50 MG PO TABS: 25.0000 mg | ORAL_TABLET | Freq: Every evening | ORAL | 0 refills | Status: DC | PRN

## 2021-03-16 MED ORDER — PRAVASTATIN SODIUM 20 MG PO TABS: ORAL_TABLET | ORAL | 0 refills | Status: DC

## 2021-03-16 MED ORDER — AMLODIPINE BESYLATE 5 MG PO TABS: 5.0000 mg | ORAL_TABLET | Freq: Every day | ORAL | 0 refills | Status: DC

## 2021-03-16 NOTE — Addendum Note (Signed)
Addended by: Lurlean Nanny on: 03/16/2021 05:19 PM   Modules accepted: Orders

## 2021-03-20 ENCOUNTER — Ambulatory Visit (INDEPENDENT_AMBULATORY_CARE_PROVIDER_SITE_OTHER): Payer: No Typology Code available for payment source

## 2021-03-20 ENCOUNTER — Encounter: Payer: Self-pay | Admitting: Podiatry

## 2021-03-20 ENCOUNTER — Ambulatory Visit (INDEPENDENT_AMBULATORY_CARE_PROVIDER_SITE_OTHER): Payer: No Typology Code available for payment source | Admitting: Podiatry

## 2021-03-20 ENCOUNTER — Other Ambulatory Visit (HOSPITAL_COMMUNITY): Payer: Self-pay

## 2021-03-20 ENCOUNTER — Other Ambulatory Visit: Payer: Self-pay

## 2021-03-20 DIAGNOSIS — M722 Plantar fascial fibromatosis: Secondary | ICD-10-CM

## 2021-03-20 MED ORDER — PRAVASTATIN SODIUM 20 MG PO TABS
20.0000 mg | ORAL_TABLET | Freq: Every day | ORAL | 0 refills | Status: DC
  Filled 2021-03-20: qty 90, 90d supply, fill #0

## 2021-03-20 MED ORDER — MELOXICAM 15 MG PO TABS: 15.0000 mg | ORAL_TABLET | Freq: Every day | ORAL | 3 refills | Status: DC

## 2021-03-20 MED ORDER — METHYLPREDNISOLONE 4 MG PO TBPK: ORAL_TABLET | ORAL | 0 refills | Status: DC

## 2021-03-20 MED ORDER — AMLODIPINE BESYLATE 5 MG PO TABS
1.0000 | ORAL_TABLET | Freq: Every day | ORAL | 0 refills | Status: DC
  Filled 2021-03-20: qty 90, 90d supply, fill #0

## 2021-03-20 MED ORDER — AMLODIPINE BESYLATE 5 MG PO TABS
1.0000 | ORAL_TABLET | Freq: Once | ORAL | 0 refills | Status: DC
  Filled 2021-03-20: qty 1, 1d supply, fill #0

## 2021-03-20 MED ORDER — TRIAMCINOLONE ACETONIDE 40 MG/ML IJ SUSP
20.0000 mg | Freq: Once | INTRAMUSCULAR | Status: AC
  Administered 2021-03-20: 20 mg

## 2021-03-20 MED ORDER — TRAZODONE HCL 50 MG PO TABS
50.0000 mg | ORAL_TABLET | Freq: Every evening | ORAL | 0 refills | Status: DC
Start: 2021-03-17 — End: 2021-06-27
  Filled 2021-03-20: qty 90, 90d supply, fill #0

## 2021-03-20 NOTE — Patient Instructions (Signed)

## 2021-03-20 NOTE — Progress Notes (Signed)
Subjective:  Patient ID: Regina Huber, female    DOB: [birth year],  MRN: [id number] HPI Chief Complaint  Patient presents with  . Foot Pain    Plantar heel left - aching x 2 months, AM pain, went to another doc-only recommended stretching-no help, also has episodes of gout 1st MPJ left  . New Patient (Initial Visit)    60 y.o. female presents with the above complaint.   Discussed etiology pathology conservative versus surgical therapies.  Past Medical History:  Diagnosis Date  . Allergy    mild - no meds   . Anemia    with twin pregnancy   . Depression   . Dyspnea    slight sob when lying flat  . 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 Hyperlipidemia   . Hypertension   . Myasthenia gravis ([place])    dx 2005   s/p  thymectomy 2006--  per pt no other symtpoms since  . Neck stiffness   . Palpitations   . Transfusion history    s/p hysterectomy. pt states she received 9 pints of blood  . Vaginal intraepithelial neoplasia III (VAIN III)    Past Surgical History:  Procedure Laterality Date  . ABDOMINAL HYSTERECTOMY  05-07-2000   w/  Right Ovarian Cystectomy  . CARDIOVASCULAR STRESS TEST  12-11-2016  dr Daneen Schick   normal nuclera study w/ no ischemia/  normal LV function and wall motion, nuclear stress ef 59% (BP demonstrated hypertensive response to exercise)  . [place]  . CO2 LASER APPLICATION N/A 01/09/5808   Procedure: CO2 LASER VAPORIZATION OF THE VAGINA;  Surgeon: Everitt Amber, MD;  Location: [facility];  Service: Gynecology;  Laterality: N/A;  . CO2 LASER APPLICATION N/A 9/83/3825   Procedure: CO2 LASER VAPORIZATION OF THE VAGINA;  Surgeon: Everitt Amber, MD;  Location: [facility];  Service: Gynecology;  Laterality: N/A;  . COMBINED ABDOMINOPLASTY AND LIPOSUCTION  03-12-2002  . LAPAROTOMY/  SUTURE INTRAPERITONEAL BLEEDING  05-08-2000  . LUMBAR DISC  SURGERY  11-07-2010   L5 - S1  . TRANSSTERNAL THYMECTOMY  04-29-2005  . TRANSTHORACIC ECHOCARDIOGRAM  12-11-2016  dr Daneen Schick   grade 1 diastolic dysfunction, ef 05-39%/  trivial MR, PR, and TR    Current Outpatient Medications:  .  meloxicam (MOBIC) 15 MG tablet, Take 1 tablet (15 mg total) by mouth daily., Disp: 30 tablet, Rfl: 3 .  methylPREDNISolone (MEDROL DOSEPAK) 4 MG TBPK tablet, 6 day dose pack - take as directed, Disp: 21 tablet, Rfl: 0 .  ALPRAZolam (XANAX) 0.25 MG tablet, Take 1 tablet (0.25 mg total) by mouth daily as needed. for anxiety, Disp: 20 tablet, Rfl: 0 .  amLODipine (NORVASC) 5 MG tablet, Take 1 tablet (5 mg total) by mouth daily., Disp: 90 tablet, Rfl: 0 .  citalopram (CELEXA) 40 MG tablet, TAKE 1 TABLET BY MOUTH DAILY, Disp: 90 tablet, Rfl: 0 .  colchicine 0.6 MG tablet, TAKE 1 TABLET BY MOUTH 2 TIMES DAILY FOR 7-10 DAYS AND THEN AS NEEDED FOR ATTACKS., Disp: 30 tablet, Rfl: 0 .  hydrochlorothiazide (HYDRODIURIL) 25 MG tablet, TAKE 1 TABLET BY MOUTH ONCE DAILY, Disp: 90 tablet, Rfl: 2 .  Multiple Vitamin (MULTIVITAMIN) tablet, Take 1 tablet by mouth daily., Disp: , Rfl:  .  pravastatin (PRAVACHOL) 20 MG tablet, Take 1 tablet (20 mg total) by mouth daily., Disp: 90 tablet,  Rfl: 0 .  traZODone (DESYREL) 50 MG tablet, Take 1/2 to 1 tablet by mouth at bedtime as needed, Disp: 90 tablet, Rfl: 0  Allergies  Allergen Reactions  . Doxycycline Other (See Comments)    Oral blisters  . Penicillins Rash    - has tolerated cephalosporins   Review of Systems Objective:  There were no vitals filed for this visit.  General: Well developed, nourished, in no acute distress, alert and oriented x3   Dermatological: Skin is warm, dry and supple bilateral. Nails x 10 are well maintained; remaining integument appears unremarkable at this time. There are no open sores, no preulcerative lesions, no rash or signs of infection present.  Vascular: Dorsalis Pedis artery and Posterior  Tibial artery pedal pulses are 2/4 bilateral with immedate capillary fill time. Pedal hair growth present. No varicosities and no lower extremity edema present bilateral.   Neruologic: Grossly intact via light touch bilateral. Vibratory intact via tuning fork bilateral. Protective threshold with Semmes Wienstein monofilament intact to all pedal sites bilateral. Patellar and Achilles deep tendon reflexes 2+ bilateral. No Babinski or clonus noted bilateral.   Musculoskeletal: No gross boney pedal deformities bilateral. No pain, crepitus, or limitation noted with foot and ankle range of motion bilateral. Muscular strength 5/5 in all groups tested bilateral.  Pain on palpation medial calcaneal tubercle of the left heel.  No pain on posterior palpation.  Gait: Unassisted, Nonantalgic.    Radiographs:  Radiographs taken today demonstrate an osseously mature individual with plantar distally oriented calcaneal heel spur and soft tissue increase in density at the plantar fashion calcaneal insertion site of the left heel.  No fractures of the calcaneus are identified.  Rectus foot type.  Assessment & Plan:   Assessment: Planter fasciitis left.  Plan: Discussed etiology pathology conservative surgical therapies at this point injected the left heel today 20 mg Kenalog 5 mg of Marcaine started on a Medrol Dosepak to be followed by meloxicam.  Placed on plantar fascial brace to be followed by a night splint.  Discussed appropriate shoe gear stretching exercises ice therapy and shoe gear modifications.  I would like to follow-up with her in 1 month should she have questions or concerns she will notify us immediately.     Rosetta Rupnow T. [place], [place]

## 2021-03-22 ENCOUNTER — Other Ambulatory Visit (HOSPITAL_COMMUNITY): Payer: Self-pay

## 2021-03-27 ENCOUNTER — Ambulatory Visit: Payer: No Typology Code available for payment source | Admitting: Internal Medicine

## 2021-03-27 ENCOUNTER — Other Ambulatory Visit (HOSPITAL_COMMUNITY): Payer: Self-pay

## 2021-03-27 ENCOUNTER — Encounter: Payer: Self-pay | Admitting: Internal Medicine

## 2021-03-29 ENCOUNTER — Other Ambulatory Visit (HOSPITAL_COMMUNITY): Payer: Self-pay

## 2021-03-29 MED ORDER — AMLODIPINE BESYLATE 5 MG PO TABS
5.0000 mg | ORAL_TABLET | Freq: Every day | ORAL | 0 refills | Status: DC
  Filled 2021-03-29: qty 90, 90d supply, fill #0

## 2021-03-30 ENCOUNTER — Other Ambulatory Visit (HOSPITAL_COMMUNITY): Payer: Self-pay

## 2021-04-19 ENCOUNTER — Ambulatory Visit: Payer: No Typology Code available for payment source | Admitting: Podiatry

## 2021-04-25 ENCOUNTER — Other Ambulatory Visit: Payer: Self-pay | Admitting: Internal Medicine

## 2021-04-25 ENCOUNTER — Other Ambulatory Visit (HOSPITAL_COMMUNITY): Payer: Self-pay

## 2021-04-25 ENCOUNTER — Encounter: Payer: Self-pay | Admitting: Internal Medicine

## 2021-04-25 MED FILL — Hydrochlorothiazide Tab 25 MG: ORAL | 90 days supply | Qty: 90 | Fill #0 | Status: AC

## 2021-04-25 NOTE — Telephone Encounter (Signed)
   Notes to clinic:  Patient has appt on 05/15/2021  Review for refill    Requested Prescriptions  Pending Prescriptions Disp Refills   citalopram (CELEXA) 40 MG tablet 90 tablet 0    Sig: TAKE 1 TABLET BY MOUTH DAILY      There is no refill protocol information for this order

## 2021-04-26 ENCOUNTER — Other Ambulatory Visit (HOSPITAL_COMMUNITY): Payer: Self-pay

## 2021-04-26 MED ORDER — CITALOPRAM HYDROBROMIDE 40 MG PO TABS
ORAL_TABLET | Freq: Every day | ORAL | 0 refills | Status: DC
  Filled 2021-04-26: qty 90, 90d supply, fill #0

## 2021-05-02 ENCOUNTER — Encounter: Payer: Self-pay | Admitting: Internal Medicine

## 2021-05-15 ENCOUNTER — Other Ambulatory Visit: Payer: Self-pay

## 2021-05-15 ENCOUNTER — Ambulatory Visit (INDEPENDENT_AMBULATORY_CARE_PROVIDER_SITE_OTHER): Payer: No Typology Code available for payment source | Admitting: Internal Medicine

## 2021-05-15 ENCOUNTER — Encounter: Payer: Self-pay | Admitting: Internal Medicine

## 2021-05-15 VITALS — BP 125/77 | HR 61 | Temp 97.3°F | Resp 17 | Ht 67.0 in | Wt 202.2 lb

## 2021-05-15 DIAGNOSIS — F419 Anxiety disorder, unspecified: Secondary | ICD-10-CM

## 2021-05-15 DIAGNOSIS — E6609 Other obesity due to excess calories: Secondary | ICD-10-CM

## 2021-05-15 DIAGNOSIS — F5101 Primary insomnia: Secondary | ICD-10-CM | POA: Diagnosis not present

## 2021-05-15 DIAGNOSIS — E7800 Pure hypercholesterolemia, unspecified: Secondary | ICD-10-CM

## 2021-05-15 DIAGNOSIS — Z0001 Encounter for general adult medical examination with abnormal findings: Secondary | ICD-10-CM

## 2021-05-15 DIAGNOSIS — M10079 Idiopathic gout, unspecified ankle and foot: Secondary | ICD-10-CM

## 2021-05-15 DIAGNOSIS — Z6831 Body mass index (BMI) 31.0-31.9, adult: Secondary | ICD-10-CM

## 2021-05-15 DIAGNOSIS — F32A Depression, unspecified: Secondary | ICD-10-CM

## 2021-05-15 DIAGNOSIS — I10 Essential (primary) hypertension: Secondary | ICD-10-CM | POA: Diagnosis not present

## 2021-05-15 DIAGNOSIS — G7000 Myasthenia gravis without (acute) exacerbation: Secondary | ICD-10-CM | POA: Diagnosis not present

## 2021-05-15 NOTE — Assessment & Plan Note (Signed)
No issues off meds °Will monitor °

## 2021-05-15 NOTE — Assessment & Plan Note (Signed)
Stable on current dose of Trazodone Will monitor

## 2021-05-15 NOTE — Assessment & Plan Note (Signed)
Controlled on Amlodipine and HCTZ °Reinforced DASH diet and exercise for weight loss °C-Met today °

## 2021-05-15 NOTE — Assessment & Plan Note (Signed)
Uric acid level today Continue Colchicine as needed Encourage low purine diet

## 2021-05-15 NOTE — Progress Notes (Signed)
Subjective:    Patient ID: Regina Huber, female    DOB: December 31, 1960, 60 y.o.   MRN: [id number]  HPI  Patient presents the clinic today for her annual exam.  She is also due to follow-up chronic conditions.  Anxiety and Depression: Chronic, managed on Citalopram.  She takes Xanax very rarely. She is not currently seeing a therapist.  She denies anxiety, SI/HI.  HTN: Her BP today is 122/77.  She is taking Amlodipine and HCTZ as prescribed.  ECG from 11/2016 reviewed.  HLD: Her last LDL was 89, triglycerides 192, 09/2019.  She denies myalgias on Pravastatin.  She tries to consume a low-fat diet.  Myasthenia Gravis: No issues, currently not medicated.  She is not seeing a neurologist at this time.  Insomnia: She has difficulty staying asleep.  She takes Trazodone as prescribed with good results.  There is no sleep study on file.  Gout: Her last flare was last week.  She takes Colchicine as needed with some relief of symptoms.  Flu: 09/2020 Tetanus: 03/2016 COVID: [place] x2 Shingrix: never Pap smear: 05/2018 Bone Density: Never Mammogram: 2022, [facility] Colon screening: 10/2019 Vision screening: annually Dentist: biannually  Diet: She does eat meat. She consume fruits and veggies. She tries to avoid fried foods. She drinks mostly water. Exercise: Walking   Review of Systems  Past Medical History:  Diagnosis Date  . Allergy    mild - no meds   . Anemia    with twin pregnancy   . Depression   . Dyspnea    slight sob when lying flat  . 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 Hyperlipidemia   . Hypertension   . Myasthenia gravis ([place])    dx 2005   s/p  thymectomy 2006--  per pt no other symtpoms since  . Neck stiffness   . Palpitations   . Transfusion history    s/p hysterectomy. pt states she received 9 pints of blood  . Vaginal intraepithelial neoplasia III (VAIN III)     Current  Outpatient Medications  Medication Sig Dispense Refill  . ALPRAZolam (XANAX) 0.25 MG tablet Take 1 tablet (0.25 mg total) by mouth daily as needed. for anxiety 20 tablet 0  . amLODipine (NORVASC) 5 MG tablet Take 1 tablet (5 mg total) by mouth daily. 90 tablet 0  . citalopram (CELEXA) 40 MG tablet TAKE 1 TABLET BY MOUTH DAILY 90 tablet 0  . colchicine 0.6 MG tablet TAKE 1 TABLET BY MOUTH 2 TIMES DAILY FOR 7-10 DAYS AND THEN AS NEEDED FOR ATTACKS. 30 tablet 0  . hydrochlorothiazide (HYDRODIURIL) 25 MG tablet TAKE 1 TABLET BY MOUTH ONCE DAILY 90 tablet 2  . meloxicam (MOBIC) 15 MG tablet Take 1 tablet (15 mg total) by mouth daily. 30 tablet 3  . methylPREDNISolone (MEDROL DOSEPAK) 4 MG TBPK tablet 6 day dose pack - take as directed 21 tablet 0  . Multiple Vitamin (MULTIVITAMIN) tablet Take 1 tablet by mouth daily.    . pravastatin (PRAVACHOL) 20 MG tablet Take 1 tablet (20 mg total) by mouth daily. 90 tablet 0  . traZODone (DESYREL) 50 MG tablet Take 1/2 to 1 tablet by mouth at bedtime as needed 90 tablet 0   No current facility-administered medications for this visit.    Allergies  Allergen Reactions  . Doxycycline Other (See Comments)    Oral blisters  . Penicillins Rash    -  has tolerated cephalosporins    Family History  Problem Relation Age of Onset  . Hypertension Mother   . Diabetes Mother   . Breast cancer Mother   . Alcohol abuse Father   . Hypertension Father   . Stroke Father   . Colon cancer Father 19  . Heart disease Brother 36       MI  . Colon polyps Brother   . Colon polyps Sister   . Colon polyps Sister   . Colon polyps Brother   . Esophageal cancer Neg Hx   . Rectal cancer Neg Hx   . Stomach cancer Neg Hx     Social History   Socioeconomic History  . Marital status: Single    Spouse name: Not on file  . Number of children: Not on file  . Years of education: Not on file  . Highest education level: Not on file  Occupational History  . Not on file   Tobacco Use  . Smoking status: Never Smoker  . Smokeless tobacco: Never Used  Vaping Use  . Vaping Use: Never used  Substance and Sexual Activity  . Alcohol use: Yes    Comment: rarely  . Drug use: No  . Sexual activity: Not on file  Other Topics Concern  . Not on file  Social History Narrative   Married, twin daughters born 26, son born 1988   Works at [employer] in [employer], mining.   Social Determinants of Health   Financial Resource Strain: Not on file  Food Insecurity: Not on file  Transportation Needs: Not on file  Physical Activity: Not on file  Stress: Not on file  Social Connections: Not on file  Intimate Partner Violence: Not on file     Constitutional: Denies fever, malaise, fatigue, headache or abrupt weight changes.  HEENT: Denies eye pain, eye redness, ear pain, ringing in the ears, wax buildup, runny nose, nasal congestion, bloody nose, or sore throat. Respiratory: Denies difficulty breathing, shortness of breath, cough or sputum production.   Cardiovascular: Denies chest pain, chest tightness, palpitations or swelling in the hands or feet.  Gastrointestinal: Denies abdominal pain, bloating, constipation, diarrhea or blood in the stool.  GU: Denies urgency, frequency, pain with urination, burning sensation, blood in urine, odor or discharge. Musculoskeletal: Pt reports intermittent gout flares. Denies decrease in range of motion, difficulty with gait, muscle pain.  Skin: Denies redness, rashes, lesions or ulcercations.  Neurological: Patient reports insomnia.  Denies dizziness, difficulty with memory, difficulty with speech or problems with balance and coordination.  Psych: Patient has a history of anxiety and depression.  Denies SI/HI.  No other specific complaints in a complete review of systems (except as listed in HPI above).     Objective:   Physical Exam  BP 125/77 (BP Location: Right Arm, Patient Position: Sitting, Cuff Size: Normal)   Pulse 61   Temp (!)  97.3 F (36.3 C) (Temporal)   Resp 17   Ht 5' 7"  (1.702 m)   Wt 202 lb 3.2 oz (91.7 kg)   SpO2 100%   BMI 31.67 kg/m   Wt Readings from Last 3 Encounters:  02/22/21 204 lb (92.5 kg)  10/19/20 191 lb (86.6 kg)  05/18/20 202 lb (91.6 kg)    General: Appears her stated age, obese, in NAD. Skin: Warm, dry and intact. No rashes noted. HEENT: Head: normal shape and size; Eyes: sclera white and EOMs intact;  Neck:  Neck supple, trachea midline. No masses, lumps or thyromegaly present.  Cardiovascular: Normal rate and rhythm. S1,S2 noted.  No murmur, rubs or gallops noted. No JVD or BLE edema. No carotid bruits noted. Pulmonary/Chest: Normal effort and positive vesicular breath sounds. No respiratory distress. No wheezes, rales or ronchi noted.  Abdomen: Soft and nontender. Normal bowel sounds. No distention or masses noted. Liver, spleen and kidneys non palpable. Musculoskeletal: Strength 5/5 BUE/BLE. No difficulty with gait.  Neurological: Alert and oriented. Cranial nerves II-XII grossly intact. Coordination normal.  Psychiatric: Mood and affect normal. Behavior is normal. Judgment and thought content normal.    BMET    Component Value Date/Time   NA 141 09/20/2019 1004   K 3.0 (L) 09/20/2019 1004   CL 101 09/20/2019 1004   CO2 29 09/20/2019 1004   GLUCOSE 99 09/20/2019 1004   BUN 12 09/20/2019 1004   CREATININE 0.73 09/20/2019 1004   CREATININE 0.88 03/04/2014 1710   CALCIUM 9.6 09/20/2019 1004   GFRNONAA >60 11/05/2010 1319   GFRAA  11/05/2010 1319    >60        The eGFR has been calculated using the MDRD equation. This calculation has not been validated in all clinical situations. eGFR's persistently <60 mL/min signify possible Chronic Kidney Disease.    Lipid Panel     Component Value Date/Time   CHOL 169 09/20/2019 1004   TRIG 192.0 (H) 09/20/2019 1004   HDL 41.30 09/20/2019 1004   CHOLHDL 4 09/20/2019 1004   VLDL 38.4 09/20/2019 1004   LDLCALC 89  09/20/2019 1004    CBC    Component Value Date/Time   WBC 7.7 09/20/2019 1004   RBC 4.91 09/20/2019 1004   HGB 13.9 09/20/2019 1004   HCT 41.0 09/20/2019 1004   PLT 286.0 09/20/2019 1004   MCV 83.4 09/20/2019 1004   MCH 28.8 03/04/2014 1710   MCHC 33.8 09/20/2019 1004   RDW 13.9 09/20/2019 1004   LYMPHSABS 1.9 09/20/2019 1004   MONOABS 0.5 09/20/2019 1004   EOSABS 0.2 09/20/2019 1004   BASOSABS 0.0 09/20/2019 1004    Hgb A1C No results found for: HGBA1C          Assessment & Plan:    Preventative Health Maintenance:  Encouraged her to get a flu shot in the fall Tetanus UTD Encouraged her to get her COVID booster She will schedule nurse visit for her Shingrix vaccine Pap smear UTD Mammogram UTD, will request copy Advised her to get a bone density with her next mammogram Colon screening UTD Encouraged her to consume a balanced diet and exercise regimen Advised her to see an eye doctor and dentist annually We will check CBC, c-Met, TSH, lipid, A1c and uric acid today  Obesity:  Encouraged high-protein, low-carb diet Encouraged 150 minutes of exercise weekly We will consider Saxenda pending labs  RTC in 1 year, sooner as needed [name], NP This visit occurred during the SARS-CoV-2 public health emergency.  Safety protocols were in place, including screening questions prior to the visit, additional usage of staff PPE, and extensive cleaning of exam room while observing appropriate contact time as indicated for disinfecting solutions.

## 2021-05-15 NOTE — Assessment & Plan Note (Signed)
Continue Citalopram and Xanax CSA today Support offered

## 2021-05-15 NOTE — Assessment & Plan Note (Signed)
C-Met and lipid profile today Encouraged her to consume a low-fat diet Continue Pravastatin  

## 2021-05-15 NOTE — Patient Instructions (Signed)

## 2021-05-16 LAB — COMPLETE METABOLIC PANEL WITH GFR
AG Ratio: 1.7 (calc) (ref 1.0–2.5)
ALT: 20 U/L (ref 6–29)
AST: 19 U/L (ref 10–35)
Albumin: 4.5 g/dL (ref 3.6–5.1)
Alkaline phosphatase (APISO): 71 U/L (ref 37–153)
BUN: 14 mg/dL (ref 7–25)
CO2: 29 mmol/L (ref 20–32)
Calcium: 9.7 mg/dL (ref 8.6–10.4)
Chloride: 103 mmol/L (ref 98–110)
Creat: 0.87 mg/dL (ref 0.50–1.05)
GFR, Est African American: 85 mL/min/{1.73_m2} (ref >=60)
GFR, Est Non African American: 73 mL/min/{1.73_m2} (ref >=60)
Globulin: 2.7 g/dL (calc) (ref 1.9–3.7)
Glucose, Bld: 91 mg/dL (ref 65–99)
Potassium: 3.7 mmol/L (ref 3.5–5.3)
Sodium: 143 mmol/L (ref 135–146)
Total Bilirubin: 0.6 mg/dL (ref 0.2–1.2)
Total Protein: 7.2 g/dL (ref 6.1–8.1)

## 2021-05-16 LAB — CBC
HCT: 46.2 % — ABNORMAL HIGH (ref 35.0–45.0)
Hemoglobin: 15.1 g/dL (ref 11.7–15.5)
MCH: 28.7 pg (ref 27.0–33.0)
MCHC: 32.7 g/dL (ref 32.0–36.0)
MCV: 87.8 fL (ref 80.0–100.0)
MPV: 9.8 fL (ref 7.5–12.5)
Platelets: 283 10*3/uL (ref 140–400)
RBC: 5.26 10*6/uL — ABNORMAL HIGH (ref 3.80–5.10)
RDW: 13.6 % (ref 11.0–15.0)
WBC: 7.1 10*3/uL (ref 3.8–10.8)

## 2021-05-16 LAB — HEMOGLOBIN A1C
Hgb A1c MFr Bld: 5.8 % of total Hgb — ABNORMAL HIGH (ref <5.7)
Mean Plasma Glucose: 120 mg/dL
eAG (mmol/L): 6.6 mmol/L

## 2021-05-16 LAB — LIPID PANEL
Cholesterol: 218 mg/dL — ABNORMAL HIGH (ref <200)
HDL: 57 mg/dL (ref >=50)
LDL Cholesterol (Calc): 127 mg/dL (calc) — ABNORMAL HIGH
Non-HDL Cholesterol (Calc): 161 mg/dL (calc) — ABNORMAL HIGH (ref <130)
Total CHOL/HDL Ratio: 3.8 (calc) (ref <5.0)
Triglycerides: 197 mg/dL — ABNORMAL HIGH (ref <150)

## 2021-05-16 LAB — TSH: TSH: 1.2 mIU/L (ref 0.40–4.50)

## 2021-05-16 LAB — URIC ACID: Uric Acid, Serum: 8.7 mg/dL — ABNORMAL HIGH (ref 2.5–7.0)

## 2021-05-17 ENCOUNTER — Other Ambulatory Visit (HOSPITAL_COMMUNITY): Payer: Self-pay

## 2021-05-17 MED ORDER — ATORVASTATIN CALCIUM 10 MG PO TABS
10.0000 mg | ORAL_TABLET | Freq: Every day | ORAL | 2 refills | Status: DC
  Filled 2021-05-17: qty 30, 30d supply, fill #0
  Filled 2021-06-14: qty 30, 30d supply, fill #1

## 2021-05-17 MED ORDER — INSULIN PEN NEEDLE 31G X 5 MM MISC
1.0000 | Freq: Every day | 0 refills | Status: DC
  Filled 2021-05-17: qty 100, 90d supply, fill #0

## 2021-05-17 MED ORDER — ALLOPURINOL 100 MG PO TABS
100.0000 mg | ORAL_TABLET | Freq: Every day | ORAL | 1 refills | Status: DC
  Filled 2021-05-17: qty 90, 90d supply, fill #0

## 2021-05-17 MED ORDER — SAXENDA 18 MG/3ML ~~LOC~~ SOPN
PEN_INJECTOR | SUBCUTANEOUS | 0 refills | Status: DC
  Filled 2021-05-17: qty 3, 14d supply, fill #0

## 2021-05-17 NOTE — Addendum Note (Signed)
Addended by: Jearld Fenton on: 05/17/2021 03:29 PM   Modules accepted: Orders

## 2021-05-18 ENCOUNTER — Other Ambulatory Visit (HOSPITAL_COMMUNITY): Payer: Self-pay

## 2021-05-19 ENCOUNTER — Encounter: Payer: Self-pay | Admitting: Internal Medicine

## 2021-05-19 DIAGNOSIS — F419 Anxiety disorder, unspecified: Secondary | ICD-10-CM

## 2021-05-19 DIAGNOSIS — M109 Gout, unspecified: Secondary | ICD-10-CM

## 2021-05-24 ENCOUNTER — Other Ambulatory Visit (HOSPITAL_COMMUNITY): Payer: Self-pay

## 2021-05-24 MED ORDER — ALPRAZOLAM 0.25 MG PO TABS: 0.2500 mg | ORAL_TABLET | Freq: Every day | ORAL | 0 refills | Status: DC | PRN

## 2021-05-25 ENCOUNTER — Other Ambulatory Visit (HOSPITAL_COMMUNITY): Payer: Self-pay

## 2021-05-28 ENCOUNTER — Other Ambulatory Visit (HOSPITAL_COMMUNITY): Payer: Self-pay

## 2021-05-29 ENCOUNTER — Other Ambulatory Visit (HOSPITAL_COMMUNITY): Payer: Self-pay

## 2021-05-30 ENCOUNTER — Telehealth: Payer: Self-pay | Admitting: Internal Medicine

## 2021-05-30 NOTE — Telephone Encounter (Signed)
Patient called to schedule a 1st shingles vaccine.  Tried to schedule a nurse visit, but system would not accept.  Please call patient to schedule vaccine.

## 2021-05-31 ENCOUNTER — Telehealth: Payer: Self-pay | Admitting: Internal Medicine

## 2021-05-31 NOTE — Telephone Encounter (Signed)
Pt called to check status on Prior Authorization for Saxenda, please advise. Requesting a call back

## 2021-06-01 ENCOUNTER — Ambulatory Visit (INDEPENDENT_AMBULATORY_CARE_PROVIDER_SITE_OTHER): Payer: No Typology Code available for payment source

## 2021-06-01 ENCOUNTER — Other Ambulatory Visit: Payer: Self-pay

## 2021-06-01 DIAGNOSIS — Z23 Encounter for immunization: Secondary | ICD-10-CM | POA: Diagnosis not present

## 2021-06-04 ENCOUNTER — Other Ambulatory Visit (HOSPITAL_COMMUNITY): Payer: Self-pay

## 2021-06-05 ENCOUNTER — Other Ambulatory Visit (HOSPITAL_COMMUNITY): Payer: Self-pay

## 2021-06-05 ENCOUNTER — Other Ambulatory Visit: Payer: Self-pay | Admitting: Internal Medicine

## 2021-06-05 MED ORDER — UNIFINE PENTIPS 32G X 4 MM MISC
1 refills | Status: DC
  Filled 2021-06-05: qty 100, 90d supply, fill #0

## 2021-06-05 MED ORDER — SAXENDA 18 MG/3ML ~~LOC~~ SOPN
1.8000 mg | PEN_INJECTOR | Freq: Every day | SUBCUTANEOUS | 0 refills | Status: DC
  Filled 2021-06-05: qty 15, 50d supply, fill #0

## 2021-06-11 ENCOUNTER — Other Ambulatory Visit: Payer: Self-pay | Admitting: Internal Medicine

## 2021-06-11 DIAGNOSIS — M109 Gout, unspecified: Secondary | ICD-10-CM

## 2021-06-11 NOTE — Telephone Encounter (Signed)
   Notes to clinic:  this script has expired  Review for continued use   Requested Prescriptions  Pending Prescriptions Disp Refills   colchicine 0.6 MG tablet 30 tablet 0      There is no refill protocol information for this order

## 2021-06-12 ENCOUNTER — Other Ambulatory Visit (HOSPITAL_COMMUNITY): Payer: Self-pay

## 2021-06-13 ENCOUNTER — Other Ambulatory Visit (HOSPITAL_COMMUNITY): Payer: Self-pay

## 2021-06-13 NOTE — Addendum Note (Signed)
Addended by: Wilson Singer on: 06/13/2021 04:07 PM   Modules accepted: Orders

## 2021-06-14 ENCOUNTER — Other Ambulatory Visit: Payer: Self-pay | Admitting: Internal Medicine

## 2021-06-14 ENCOUNTER — Encounter: Payer: Self-pay | Admitting: Internal Medicine

## 2021-06-14 MED ORDER — AMLODIPINE BESYLATE 5 MG PO TABS
5.0000 mg | ORAL_TABLET | Freq: Every day | ORAL | 1 refills | Status: DC
  Filled 2021-06-14: qty 90, 90d supply, fill #0
  Filled 2021-09-30: qty 90, 90d supply, fill #1

## 2021-06-14 MED ORDER — COLCHICINE 0.6 MG PO TABS: ORAL_TABLET | ORAL | 0 refills | Status: DC

## 2021-06-15 ENCOUNTER — Other Ambulatory Visit: Payer: Self-pay

## 2021-06-15 ENCOUNTER — Other Ambulatory Visit (HOSPITAL_COMMUNITY): Payer: Self-pay

## 2021-06-15 DIAGNOSIS — M109 Gout, unspecified: Secondary | ICD-10-CM

## 2021-06-15 MED ORDER — COLCHICINE 0.6 MG PO TABS
ORAL_TABLET | ORAL | 0 refills | Status: DC
  Filled 2021-06-15: qty 30, 15d supply, fill #0

## 2021-06-19 ENCOUNTER — Other Ambulatory Visit (HOSPITAL_COMMUNITY): Payer: Self-pay

## 2021-06-27 ENCOUNTER — Other Ambulatory Visit: Payer: Self-pay | Admitting: Internal Medicine

## 2021-06-28 ENCOUNTER — Other Ambulatory Visit (HOSPITAL_COMMUNITY): Payer: Self-pay

## 2021-06-28 MED ORDER — TRAZODONE HCL 50 MG PO TABS
50.0000 mg | ORAL_TABLET | Freq: Every evening | ORAL | 0 refills | Status: DC
  Filled 2021-06-28: qty 90, 90d supply, fill #0

## 2021-06-28 NOTE — Telephone Encounter (Signed)
   Notes to clinic:  Verify dose and direction for refill    Requested Prescriptions  Pending Prescriptions Disp Refills   traZODone (DESYREL) 50 MG tablet 90 tablet 0    Sig: Take 1 tablet (50 mg total) by mouth at bedtime.      There is no refill protocol information for this order

## 2021-07-09 ENCOUNTER — Encounter: Payer: Self-pay | Admitting: Internal Medicine

## 2021-07-09 DIAGNOSIS — F419 Anxiety disorder, unspecified: Secondary | ICD-10-CM

## 2021-07-09 DIAGNOSIS — F32A Depression, unspecified: Secondary | ICD-10-CM

## 2021-07-10 ENCOUNTER — Other Ambulatory Visit (HOSPITAL_COMMUNITY): Payer: Self-pay

## 2021-07-10 MED ORDER — ATORVASTATIN CALCIUM 10 MG PO TABS
10.0000 mg | ORAL_TABLET | Freq: Every day | ORAL | 2 refills | Status: DC
  Filled 2021-07-10: qty 90, 90d supply, fill #0
  Filled 2021-09-30: qty 90, 90d supply, fill #1
  Filled 2022-01-15: qty 90, 90d supply, fill #2

## 2021-07-11 ENCOUNTER — Other Ambulatory Visit: Payer: Self-pay | Admitting: Internal Medicine

## 2021-07-11 DIAGNOSIS — I10 Essential (primary) hypertension: Secondary | ICD-10-CM

## 2021-07-12 ENCOUNTER — Other Ambulatory Visit (HOSPITAL_COMMUNITY): Payer: Self-pay

## 2021-07-12 MED ORDER — HYDROCHLOROTHIAZIDE 25 MG PO TABS
ORAL_TABLET | Freq: Every day | ORAL | 0 refills | Status: DC
  Filled 2021-07-12: qty 90, 90d supply, fill #0

## 2021-07-30 ENCOUNTER — Other Ambulatory Visit: Payer: Self-pay | Admitting: Internal Medicine

## 2021-07-30 ENCOUNTER — Other Ambulatory Visit (HOSPITAL_COMMUNITY): Payer: Self-pay

## 2021-07-30 MED ORDER — ALPRAZOLAM 0.25 MG PO TABS
0.2500 mg | ORAL_TABLET | Freq: Every day | ORAL | 0 refills | Status: DC | PRN
  Filled 2021-07-30: qty 20, 20d supply, fill #0

## 2021-07-30 NOTE — Addendum Note (Signed)
Addended by: Wilson Singer on: 07/30/2021 07:59 AM   Modules accepted: Orders

## 2021-07-31 ENCOUNTER — Other Ambulatory Visit (HOSPITAL_COMMUNITY): Payer: Self-pay

## 2021-07-31 MED ORDER — CITALOPRAM HYDROBROMIDE 40 MG PO TABS
ORAL_TABLET | Freq: Every day | ORAL | 0 refills | Status: DC
  Filled 2021-07-31: qty 90, 90d supply, fill #0

## 2021-07-31 MED ORDER — ALLOPURINOL 100 MG PO TABS
100.0000 mg | ORAL_TABLET | Freq: Every day | ORAL | 1 refills | Status: DC
  Filled 2021-07-31: qty 90, 90d supply, fill #0
  Filled 2021-11-07: qty 90, 90d supply, fill #1

## 2021-07-31 NOTE — Addendum Note (Signed)
Addended by: Wilson Singer on: 07/31/2021 01:08 PM   Modules accepted: Orders

## 2021-08-01 ENCOUNTER — Other Ambulatory Visit: Payer: Self-pay

## 2021-08-01 ENCOUNTER — Ambulatory Visit (INDEPENDENT_AMBULATORY_CARE_PROVIDER_SITE_OTHER): Payer: No Typology Code available for payment source | Admitting: Internal Medicine

## 2021-08-01 ENCOUNTER — Ambulatory Visit: Payer: No Typology Code available for payment source

## 2021-08-01 DIAGNOSIS — Z23 Encounter for immunization: Secondary | ICD-10-CM | POA: Diagnosis not present

## 2021-08-07 ENCOUNTER — Other Ambulatory Visit: Payer: Self-pay | Admitting: Internal Medicine

## 2021-08-07 ENCOUNTER — Other Ambulatory Visit (HOSPITAL_COMMUNITY): Payer: Self-pay

## 2021-08-07 NOTE — Telephone Encounter (Signed)
  Notes to clinic:  Verify for continued use and refill    Requested Prescriptions  Pending Prescriptions Disp Refills   Liraglutide -Weight Management (SAXENDA) 18 MG/3ML SOPN 15 mL 0    Sig: Inject 1.8 mg into the skin daily.     Endocrinology:  Diabetes - GLP-1 Receptor Agonists Passed - 08/07/2021  9:56 AM      Passed - HBA1C is between 0 and 7.9 and within 180 days    Hgb A1c MFr Bld  Date Value Ref Range Status  05/15/2021 5.8 (H) <5.7 % of total Hgb Final    Comment:    For someone without known diabetes, a hemoglobin  A1c value between 5.7% and 6.4% is consistent with prediabetes and should be confirmed with a  follow-up test. . For someone with known diabetes, a value <7% indicates that their diabetes is well controlled. A1c targets should be individualized based on duration of diabetes, age, comorbid conditions, and other considerations. . This assay result is consistent with an increased risk of diabetes. . Currently, no consensus exists regarding use of hemoglobin A1c for diagnosis of diabetes for children. Renella Cunas - Valid encounter within last 6 months    Recent Outpatient Visits           2 months ago Encounter for general adult medical examination with abnormal findings   [facility] [place], Coralie Keens, NP

## 2021-08-08 ENCOUNTER — Other Ambulatory Visit (HOSPITAL_COMMUNITY): Payer: Self-pay

## 2021-08-08 ENCOUNTER — Ambulatory Visit (INDEPENDENT_AMBULATORY_CARE_PROVIDER_SITE_OTHER): Payer: No Typology Code available for payment source | Admitting: Internal Medicine

## 2021-08-08 ENCOUNTER — Encounter: Payer: Self-pay | Admitting: Internal Medicine

## 2021-08-08 ENCOUNTER — Other Ambulatory Visit: Payer: Self-pay

## 2021-08-08 VITALS — BP 123/68 | HR 62 | Temp 97.3°F | Resp 17 | Ht 67.0 in | Wt 195.8 lb

## 2021-08-08 DIAGNOSIS — Z6831 Body mass index (BMI) 31.0-31.9, adult: Secondary | ICD-10-CM

## 2021-08-08 DIAGNOSIS — E6609 Other obesity due to excess calories: Secondary | ICD-10-CM | POA: Diagnosis not present

## 2021-08-08 DIAGNOSIS — R7303 Prediabetes: Secondary | ICD-10-CM | POA: Diagnosis not present

## 2021-08-08 MED ORDER — SAXENDA 18 MG/3ML ~~LOC~~ SOPN
1.8000 mg | PEN_INJECTOR | Freq: Every day | SUBCUTANEOUS | 0 refills | Status: DC
  Filled 2021-08-08: qty 15, 50d supply, fill #0

## 2021-08-08 NOTE — Progress Notes (Signed)
Subjective:    Patient ID: Regina Huber, female    DOB: 1961/12/13, 60 y.o.   MRN: [id number]  HPI  Patient presents to clinic today for 13-monthfollow-up for weight check and med refill.  She was started on Saxenda 04/2021.  Her starting weight was 202.2 pounds with a BMI of 31.67.  Her weight today is 195.8 pounds with a BMI of 30.67.  Her goal weight is 170 pounds.  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 pt states she received 9 pints of blood   Vaginal intraepithelial neoplasia III (VAIN III)     Current Outpatient Medications  Medication Sig Dispense Refill   hydrochlorothiazide (HYDRODIURIL) 25 MG tablet TAKE 1 TABLET BY MOUTH ONCE DAILY 90 tablet 0   allopurinol (ZYLOPRIM) 100 MG tablet Take 1 tablet by mouth daily. 90 tablet 1   ALPRAZolam (XANAX) 0.25 MG tablet Take 1 tablet by mouth daily as needed for anxiety 20 tablet 0   amLODipine (NORVASC) 5 MG tablet Take 1 tablet (5 mg total) by mouth daily. 90 tablet 1   atorvastatin (LIPITOR) 10 MG tablet Take 1 tablet (10 mg total) by mouth daily. 90 tablet 2   citalopram (CELEXA) 40 MG tablet TAKE 1 TABLET BY MOUTH DAILY 90 tablet 0   colchicine 0.6 MG tablet TAKE 1 TABLET BY MOUTH 2 TIMES DAILY FOR 7-10 DAYS AND THEN AS NEEDED FOR ATTACKS. 30 tablet 0   Insulin Pen Needle 31G X 5 MM MISC Use daily as directed 100 each 0   Liraglutide -Weight  Management (SAXENDA) 18 MG/3ML SOPN Inject 1.8 mg into the skin daily. 15 mL 0   meloxicam (MOBIC) 15 MG tablet Take 1 tablet (15 mg total) by mouth daily. 30 tablet 3   Multiple Vitamin (MULTIVITAMIN) tablet Take 1 tablet by mouth daily.     traZODone (DESYREL) 50 MG tablet Take 1 tablet (50 mg total) by mouth at bedtime. 90 tablet 0   No current facility-administered medications for this visit.    Allergies  Allergen Reactions   Doxycycline Other (See Comments)    Oral blisters   Penicillins Rash    - has tolerated cephalosporins    Family History  Problem Relation Age of Onset   Hypertension Mother    Diabetes Mother    Breast cancer Mother    Alcohol abuse Father    Hypertension Father    Stroke Father    Colon cancer Father [phone]  Heart disease Brother 534      MI   Colon polyps Brother    Colon polyps Sister    Colon polyps Sister    Colon polyps Brother    Esophageal cancer Neg Hx    Rectal  cancer Neg Hx    Stomach cancer Neg Hx     Social History   Socioeconomic History   Marital status: Single    Spouse name: Not on file   Number of children: Not on file   Years of education: Not on file   Highest education level: Not on file  Occupational History   Not on file  Tobacco Use   Smoking status: Never   Smokeless tobacco: Never  Vaping Use   Vaping Use: Never used  Substance and Sexual Activity   Alcohol use: Yes    Comment: rarely   Drug use: No   Sexual activity: Not on file  Other Topics Concern   Not on file  Social History Narrative   Married, twin daughters born 43, son born 8   Works at [employer] in [employer], mining.   Social Determinants of Health   Financial Resource Strain: Not on file  Food Insecurity: Not on file  Transportation Needs: Not on file  Physical Activity: Not on file  Stress: Not on file  Social Connections: Not on file  Intimate Partner Violence: Not on file     Constitutional: Denies fever, malaise, fatigue, headache or  abrupt weight changes.  Respiratory: Denies difficulty breathing, shortness of breath, cough or sputum production.   Cardiovascular: Denies chest pain, chest tightness, palpitations or swelling in the hands or feet.  Neurological: Denies dizziness, difficulty with memory, difficulty with speech or problems with balance and coordination.    No other specific complaints in a complete review of systems (except as listed in HPI above).  Objective:   Physical Exam  BP 123/68 (BP Location: Right Arm, Patient Position: Sitting, Cuff Size: Normal)   Pulse 62   Temp (!) 97.3 F (36.3 C) (Temporal)   Resp 17   Ht '5\' 7"'$  (1.702 m)   Wt 195 lb 12.8 oz (88.8 kg)   SpO2 98%   BMI 30.67 kg/m   Wt Readings from Last 3 Encounters:  05/15/21 202 lb 3.2 oz (91.7 kg)  02/22/21 204 lb (92.5 kg)  10/19/20 191 lb (86.6 kg)    General: Appears her stated age, obese, in NAD. Skin: Warm, dry and intact. Cardiovascular: Normal rate and rhythm.  Pulmonary/Chest: Normal effort and positive vesicular breath sounds.  Neurological: Alert and oriented.    BMET    Component Value Date/Time   NA 143 05/15/2021 0822   K 3.7 05/15/2021 0822   CL 103 05/15/2021 0822   CO2 29 05/15/2021 0822   GLUCOSE 91 05/15/2021 0822   BUN 14 05/15/2021 0822   CREATININE 0.87 05/15/2021 0822   CALCIUM 9.7 05/15/2021 0822   GFRNONAA 73 05/15/2021 0822   GFRAA 85 05/15/2021 0822    Lipid Panel     Component Value Date/Time   CHOL 218 (H) 05/15/2021 0822   TRIG 197 (H) 05/15/2021 0822   HDL 57 05/15/2021 0822   CHOLHDL 3.8 05/15/2021 0822   VLDL 38.4 09/20/2019 1004   LDLCALC 127 (H) 05/15/2021 0822    CBC    Component Value Date/Time   WBC 7.1 05/15/2021 0822   RBC 5.26 (H) 05/15/2021 0822   HGB 15.1 05/15/2021 0822   HCT 46.2 (H) 05/15/2021 0822   PLT 283 05/15/2021 0822   MCV 87.8 05/15/2021 0822   MCH 28.7 05/15/2021 0822   MCHC 32.7 05/15/2021 0822   RDW 13.6 05/15/2021 0822   LYMPHSABS 1.9  09/20/2019 1004   MONOABS 0.5 09/20/2019 1004   EOSABS 0.2  09/20/2019 1004   BASOSABS 0.0 09/20/2019 1004    Hgb A1C Lab Results  Component Value Date   HGBA1C 5.8 (H) 05/15/2021            Assessment & Plan:    [name], NP This visit occurred during the SARS-CoV-2 public health emergency.  Safety protocols were in place, including screening questions prior to the visit, additional usage of staff PPE, and extensive cleaning of exam room while observing appropriate contact time as indicated for disinfecting solutions.

## 2021-08-08 NOTE — Patient Instructions (Signed)

## 2021-08-08 NOTE — Assessment & Plan Note (Signed)
Encourage low-carb diet and exercise for weight loss 

## 2021-08-08 NOTE — Assessment & Plan Note (Signed)
Encourage low-carb diet Increase exercise if able Saxenda refilled today, she will titrate up to 3 mg daily  RTC in 3 months for weight check and med refill

## 2021-08-09 ENCOUNTER — Other Ambulatory Visit (HOSPITAL_COMMUNITY): Payer: Self-pay

## 2021-08-10 ENCOUNTER — Other Ambulatory Visit: Payer: Self-pay | Admitting: Internal Medicine

## 2021-08-10 ENCOUNTER — Other Ambulatory Visit (HOSPITAL_COMMUNITY): Payer: Self-pay

## 2021-08-10 NOTE — Telephone Encounter (Signed)
Requested medications are due for refill today NO  Requested medications are on the active medication list NO, dose inconsistent with current  med list  Last refill 6/21  Last visit 8/24  Future visit scheduled 2023  Notes to clinic Not consistent with current med list

## 2021-08-10 NOTE — Telephone Encounter (Signed)
Requested medications are due for refill today NO  Requested medications are on the active medication list Dose inconsistent with current med list  Last refill 06/05/21  Last visit 08/08/21  Future visit scheduled 2023  Notes to clinic Dose inconsistent with current med list.

## 2021-08-13 ENCOUNTER — Other Ambulatory Visit (HOSPITAL_COMMUNITY): Payer: Self-pay

## 2021-08-13 ENCOUNTER — Telehealth: Payer: Self-pay

## 2021-08-13 MED ORDER — SAXENDA 18 MG/3ML ~~LOC~~ SOPN
3.0000 mg | PEN_INJECTOR | Freq: Every day | SUBCUTANEOUS | 0 refills | Status: DC
  Filled 2021-08-13: qty 15, 30d supply, fill #0

## 2021-08-13 NOTE — Telephone Encounter (Signed)
Copied from [place] [id number]. Topic: General - Other >> Aug 13, 2021 11:07 AM Yvette Rack wrote: Reason for CRM: Pt stated she needs to speak with [facility] nurse because her pharmacy advised that the Rx for Liraglutide -Weight Management ([place]) 18 MG/3ML SOPN was denied. Informed pt that it shows the Rx was approved on 08/08/21. Pt stated she is not sure what is going on. Pt stated it could be an issue with insurance and requests call back.

## 2021-08-13 NOTE — Telephone Encounter (Signed)
The pt Saxenda prescription was resubmitted to her pharmacy.

## 2021-08-14 ENCOUNTER — Other Ambulatory Visit (HOSPITAL_COMMUNITY): Payer: Self-pay

## 2021-09-11 ENCOUNTER — Other Ambulatory Visit: Payer: Self-pay | Admitting: Internal Medicine

## 2021-09-12 ENCOUNTER — Other Ambulatory Visit (HOSPITAL_COMMUNITY): Payer: Self-pay

## 2021-09-12 MED ORDER — SAXENDA 18 MG/3ML ~~LOC~~ SOPN
3.0000 mg | PEN_INJECTOR | Freq: Every day | SUBCUTANEOUS | 0 refills | Status: DC
  Filled 2021-09-12: qty 15, 30d supply, fill #0

## 2021-09-30 ENCOUNTER — Other Ambulatory Visit: Payer: Self-pay | Admitting: Internal Medicine

## 2021-09-30 DIAGNOSIS — I10 Essential (primary) hypertension: Secondary | ICD-10-CM

## 2021-10-01 ENCOUNTER — Other Ambulatory Visit (HOSPITAL_COMMUNITY): Payer: Self-pay

## 2021-10-01 ENCOUNTER — Other Ambulatory Visit: Payer: Self-pay | Admitting: Internal Medicine

## 2021-10-01 DIAGNOSIS — F32A Depression, unspecified: Secondary | ICD-10-CM

## 2021-10-01 MED ORDER — HYDROCHLOROTHIAZIDE 25 MG PO TABS
ORAL_TABLET | Freq: Every day | ORAL | 0 refills | Status: DC
  Filled 2021-10-01: qty 90, 90d supply, fill #0

## 2021-10-01 MED ORDER — TRAZODONE HCL 50 MG PO TABS
50.0000 mg | ORAL_TABLET | Freq: Every evening | ORAL | 0 refills | Status: DC
  Filled 2021-10-01: qty 90, 90d supply, fill #0

## 2021-10-01 NOTE — Telephone Encounter (Signed)
Requested Prescriptions  Pending Prescriptions Disp Refills  . traZODone (DESYREL) 50 MG tablet 90 tablet 0    Sig: Take 1 tablet (50 mg total) by mouth at bedtime.     Psychiatry: Antidepressants - Serotonin Modulator Passed - 10/01/2021  8:23 AM      Passed - Completed PHQ-2 or PHQ-9 in the last 360 days      Passed - Valid encounter within last 6 months    Recent Outpatient Visits          1 month ago Class 1 obesity due to excess calories with serious comorbidity and body mass index (BMI) of 31.0 to 31.9 in adult   [place], [place], NP   4 months ago Encounter for general adult medical examination with abnormal findings   [facility] [place], Coralie Keens, NP             . hydrochlorothiazide (HYDRODIURIL) 25 MG tablet 90 tablet 0    Sig: TAKE 1 TABLET BY MOUTH ONCE DAILY     Cardiovascular: Diuretics - Thiazide Passed - 10/01/2021  8:23 AM      Passed - Ca in normal range and within 360 days    Calcium  Date Value Ref Range Status  05/15/2021 9.7 8.6 - 10.4 mg/dL Final   Calcium, Ion  Date Value Ref Range Status  09/11/2017 1.12 (L) 1.15 - 1.40 mmol/L Final         Passed - Cr in normal range and within 360 days    Creat  Date Value Ref Range Status  05/15/2021 0.87 0.50 - 1.05 mg/dL Final    Comment:    For patients >62 years of age, the reference limit for Creatinine is approximately 13% higher for people identified as African-American. .          Passed - K in normal range and within 360 days    Potassium  Date Value Ref Range Status  05/15/2021 3.7 3.5 - 5.3 mmol/L Final         Passed - Na in normal range and within 360 days    Sodium  Date Value Ref Range Status  05/15/2021 143 135 - 146 mmol/L Final         Passed - Last BP in normal range    BP Readings from Last 1 Encounters:  08/08/21 123/68         Passed - Valid encounter within last 6 months    Recent Outpatient Visits          1 month ago  Class 1 obesity due to excess calories with serious comorbidity and body mass index (BMI) of 31.0 to 31.9 in adult   [place], Coralie Keens, NP   4 months ago Encounter for general adult medical examination with abnormal findings   [facility] [place], Coralie Keens, NP

## 2021-10-01 NOTE — Telephone Encounter (Signed)
Requested medications are due for refill today.  yes  Requested medications are on the active medications list.  yes  Last refill. 07/30/2021  Future visit scheduled.   Nurse visit  Notes to clinic.  Medication not delegated.

## 2021-10-02 ENCOUNTER — Other Ambulatory Visit (HOSPITAL_COMMUNITY): Payer: Self-pay

## 2021-10-02 ENCOUNTER — Ambulatory Visit (INDEPENDENT_AMBULATORY_CARE_PROVIDER_SITE_OTHER): Payer: No Typology Code available for payment source

## 2021-10-02 ENCOUNTER — Other Ambulatory Visit: Payer: Self-pay

## 2021-10-02 DIAGNOSIS — Z23 Encounter for immunization: Secondary | ICD-10-CM | POA: Diagnosis not present

## 2021-10-02 MED ORDER — ALPRAZOLAM 0.25 MG PO TABS
0.2500 mg | ORAL_TABLET | Freq: Every day | ORAL | 0 refills | Status: DC | PRN
Start: 2021-10-02 — End: 2021-11-23
  Filled 2021-10-02: qty 20, 20d supply, fill #0

## 2021-10-03 ENCOUNTER — Other Ambulatory Visit (HOSPITAL_COMMUNITY): Payer: Self-pay

## 2021-10-20 ENCOUNTER — Other Ambulatory Visit (HOSPITAL_COMMUNITY): Payer: Self-pay

## 2021-10-20 ENCOUNTER — Other Ambulatory Visit: Payer: Self-pay | Admitting: Internal Medicine

## 2021-10-20 MED ORDER — CITALOPRAM HYDROBROMIDE 40 MG PO TABS
ORAL_TABLET | Freq: Every day | ORAL | 0 refills | Status: DC
  Filled 2021-10-20: qty 90, 90d supply, fill #0

## 2021-10-20 MED ORDER — SAXENDA 18 MG/3ML ~~LOC~~ SOPN
3.0000 mg | PEN_INJECTOR | Freq: Every day | SUBCUTANEOUS | 0 refills | Status: DC
  Filled 2021-10-20 – 2021-10-26 (×2): qty 15, 30d supply, fill #0

## 2021-10-20 NOTE — Telephone Encounter (Signed)
Requested Prescriptions  Pending Prescriptions Disp Refills  . Liraglutide -Weight Management (SAXENDA) 18 MG/3ML SOPN 15 mL 0    Sig: Inject 3 mg into the skin daily.     Endocrinology:  Diabetes - GLP-1 Receptor Agonists Passed - 10/20/2021  7:02 AM      Passed - HBA1C is between 0 and 7.9 and within 180 days    Hgb A1c MFr Bld  Date Value Ref Range Status  05/15/2021 5.8 (H) <5.7 % of total Hgb Final    Comment:    For someone without known diabetes, a hemoglobin  A1c value between 5.7% and 6.4% is consistent with prediabetes and should be confirmed with a  follow-up test. . For someone with known diabetes, a value <7% indicates that their diabetes is well controlled. A1c targets should be individualized based on duration of diabetes, age, comorbid conditions, and other considerations. . This assay result is consistent with an increased risk of diabetes. . Currently, no consensus exists regarding use of hemoglobin A1c for diagnosis of diabetes for children. Renella Cunas - Valid encounter within last 6 months    Recent Outpatient Visits          2 months ago Class 1 obesity due to excess calories with serious comorbidity and body mass index (BMI) of 31.0 to 31.9 in adult   [place], Coralie Keens, NP   5 months ago Encounter for general adult medical examination with abnormal findings   [facility] [place], Coralie Keens, NP

## 2021-10-20 NOTE — Telephone Encounter (Signed)
Requested Prescriptions  Pending Prescriptions Disp Refills  . citalopram (CELEXA) 40 MG tablet 90 tablet 0    Sig: TAKE 1 TABLET BY MOUTH DAILY     Psychiatry:  Antidepressants - SSRI Passed - 10/20/2021  7:26 AM      Passed - Completed PHQ-2 or PHQ-9 in the last 360 days      Passed - Valid encounter within last 6 months    Recent Outpatient Visits          2 months ago Class 1 obesity due to excess calories with serious comorbidity and body mass index (BMI) of 31.0 to 31.9 in adult   [place], Coralie Keens, NP   5 months ago Encounter for general adult medical examination with abnormal findings   [facility] [place], Coralie Keens, NP

## 2021-10-22 ENCOUNTER — Other Ambulatory Visit (HOSPITAL_COMMUNITY): Payer: Self-pay

## 2021-10-26 ENCOUNTER — Other Ambulatory Visit (HOSPITAL_COMMUNITY): Payer: Self-pay

## 2021-11-07 ENCOUNTER — Other Ambulatory Visit (HOSPITAL_COMMUNITY): Payer: Self-pay

## 2021-11-07 ENCOUNTER — Encounter: Payer: Self-pay | Admitting: Internal Medicine

## 2021-11-13 ENCOUNTER — Other Ambulatory Visit (HOSPITAL_COMMUNITY): Payer: Self-pay

## 2021-11-13 LAB — RESULTS CONSOLE HPV: CHL HPV: NEGATIVE

## 2021-11-13 LAB — HM MAMMOGRAPHY

## 2021-11-13 LAB — HM PAP SMEAR: HM Pap smear: NORMAL

## 2021-11-13 MED ORDER — NYSTATIN-TRIAMCINOLONE 100000-0.1 UNIT/GM-% EX OINT
TOPICAL_OINTMENT | CUTANEOUS | 2 refills | Status: DC
  Filled 2021-11-13: qty 30, 30d supply, fill #0

## 2021-11-14 ENCOUNTER — Other Ambulatory Visit (HOSPITAL_COMMUNITY): Payer: Self-pay

## 2021-11-23 ENCOUNTER — Other Ambulatory Visit: Payer: Self-pay | Admitting: Internal Medicine

## 2021-11-23 DIAGNOSIS — F419 Anxiety disorder, unspecified: Secondary | ICD-10-CM

## 2021-11-23 DIAGNOSIS — F32A Depression, unspecified: Secondary | ICD-10-CM

## 2021-11-23 NOTE — Telephone Encounter (Signed)
Requested medication (s) are due for refill today: yes  Requested medication (s) are on the active medication list: yes  Last refill:  10/03/21  Future visit scheduled: no, last seen and this med/dx addressed 05/15/21  Notes to clinic: This medication can not be delegated, please assess.    Requested Prescriptions  Pending Prescriptions Disp Refills   ALPRAZolam (XANAX) 0.25 MG tablet 20 tablet 0    Sig: Take 1 tablet by mouth daily as needed for anxiety     Not Delegated - Psychiatry:  Anxiolytics/Hypnotics Failed - 11/23/2021  6:08 PM      Failed - This refill cannot be delegated      Failed - Urine Drug Screen completed in last 360 days      Passed - Valid encounter within last 6 months    Recent Outpatient Visits           3 months ago Class 1 obesity due to excess calories with serious comorbidity and body mass index (BMI) of 31.0 to 31.9 in adult   [place], Coralie Keens, NP   6 months ago Encounter for general adult medical examination with abnormal findings   [facility] [place], Coralie Keens, NP

## 2021-11-26 ENCOUNTER — Other Ambulatory Visit (HOSPITAL_COMMUNITY): Payer: Self-pay

## 2021-11-26 MED ORDER — ALPRAZOLAM 0.25 MG PO TABS
0.2500 mg | ORAL_TABLET | Freq: Every day | ORAL | 0 refills | Status: DC | PRN
  Filled 2021-11-26: qty 20, 20d supply, fill #0

## 2021-12-17 ENCOUNTER — Other Ambulatory Visit: Payer: Self-pay | Admitting: Internal Medicine

## 2021-12-18 ENCOUNTER — Other Ambulatory Visit (HOSPITAL_COMMUNITY): Payer: Self-pay

## 2021-12-18 ENCOUNTER — Encounter: Payer: Self-pay | Admitting: Internal Medicine

## 2021-12-19 ENCOUNTER — Other Ambulatory Visit (HOSPITAL_COMMUNITY): Payer: Self-pay

## 2021-12-19 MED ORDER — SAXENDA 18 MG/3ML ~~LOC~~ SOPN
3.0000 mg | PEN_INJECTOR | Freq: Every day | SUBCUTANEOUS | 0 refills | Status: DC
  Filled 2021-12-19: qty 15, 30d supply, fill #0

## 2021-12-22 ENCOUNTER — Other Ambulatory Visit: Payer: Self-pay | Admitting: Internal Medicine

## 2021-12-23 MED ORDER — AMLODIPINE BESYLATE 5 MG PO TABS
5.0000 mg | ORAL_TABLET | Freq: Every day | ORAL | 0 refills | Status: DC
  Filled 2021-12-23: qty 90, 90d supply, fill #0

## 2021-12-23 MED ORDER — TRAZODONE HCL 50 MG PO TABS
50.0000 mg | ORAL_TABLET | Freq: Every evening | ORAL | 0 refills | Status: DC
  Filled 2021-12-23: qty 90, 90d supply, fill #0

## 2021-12-23 NOTE — Telephone Encounter (Signed)
Requested Prescriptions  Pending Prescriptions Disp Refills   amLODipine (NORVASC) 5 MG tablet 90 tablet 1    Sig: Take 1 tablet (5 mg total) by mouth daily.     Cardiovascular:  Calcium Channel Blockers Passed - 12/23/2021  7:37 AM      Passed - Last BP in normal range    BP Readings from Last 1 Encounters:  08/08/21 123/68         Passed - Valid encounter within last 6 months    Recent Outpatient Visits          4 months ago Class 1 obesity due to excess calories with serious comorbidity and body mass index (BMI) of 31.0 to 31.9 in adult   [place], Coralie Keens, NP   7 months ago Encounter for general adult medical examination with abnormal findings   [facility] [place], Coralie Keens, NP

## 2021-12-23 NOTE — Telephone Encounter (Signed)
Requested Prescriptions  Pending Prescriptions Disp Refills   traZODone (DESYREL) 50 MG tablet 90 tablet 0    Sig: Take 1 tablet (50 mg total) by mouth at bedtime.     Psychiatry: Antidepressants - Serotonin Modulator Passed - 12/22/2021  8:41 PM      Passed - Completed PHQ-2 or PHQ-9 in the last 360 days      Passed - Valid encounter within last 6 months    Recent Outpatient Visits          4 months ago Class 1 obesity due to excess calories with serious comorbidity and body mass index (BMI) of 31.0 to 31.9 in adult   [place], Coralie Keens, NP   7 months ago Encounter for general adult medical examination with abnormal findings   [facility] [place], Coralie Keens, NP

## 2021-12-24 ENCOUNTER — Other Ambulatory Visit (HOSPITAL_COMMUNITY): Payer: Self-pay

## 2022-01-08 ENCOUNTER — Ambulatory Visit: Payer: Self-pay | Admitting: *Deleted

## 2022-01-08 ENCOUNTER — Other Ambulatory Visit: Payer: Self-pay | Admitting: Internal Medicine

## 2022-01-08 DIAGNOSIS — F419 Anxiety disorder, unspecified: Secondary | ICD-10-CM

## 2022-01-08 NOTE — Telephone Encounter (Signed)
°  Chief Complaint: Coughing when eating, choking sensation Symptoms: "Get choked a lot when eating or drinking, not like I'm choking on something, not like something is stuck." Also noted lump right side of neck. Frequency: onset 6 weeks ago Pertinent Negatives: Patient denies pain, SOB, sore throat Disposition: [] ED /[] Urgent Care (no appt availability in office) / [x] Appointment(In office/virtual)/ []  [facility]/ [] Home Care/ [] Refused Recommended Disposition /[] [place] [facility]/ []  Follow-up with PCP Additional Notes: ADvised ED for worsening symptoms.       Reason for Disposition  Difficulty swallowing is a chronic symptom (recurrent or ongoing AND present > 4 weeks)  Answer Assessment - Initial Assessment Questions 1. SYMPTOM: "Are you having difficulty swallowing liquids, solids, or both?"     Get choked a lot when eating and with water 2. ONSET: "When did the swallowing problems begin?"      6 weeks ago 3. CAUSE: "What do you think is causing the problem?"      "Feel a knot right side of neck" 4. CHRONIC/RECURRENT: "Is this a new problem for you?"  If no, ask: "How long have you had this problem?" (e.g., days, weeks, months)      6 weeks ago 5. OTHER SYMPTOMS: "Do you have any other symptoms?" (e.g., difficulty breathing, sore throat, swollen tongue, chest pain)     No  Protocols used: Swallowing Difficulty-A-AH

## 2022-01-09 ENCOUNTER — Other Ambulatory Visit (HOSPITAL_COMMUNITY): Payer: Self-pay

## 2022-01-09 MED ORDER — ALPRAZOLAM 0.25 MG PO TABS
0.2500 mg | ORAL_TABLET | Freq: Every day | ORAL | 0 refills | Status: DC | PRN
  Filled 2022-01-09: qty 20, 20d supply, fill #0

## 2022-01-09 NOTE — Telephone Encounter (Signed)
Requested medication (s) are due for refill today: yes  Requested medication (s) are on the active medication list: yes  Last refill:  11/26/21  Future visit scheduled: 01/15/22  Notes to clinic:  This medication can not be delegated, please assess.   Requested Prescriptions  Pending Prescriptions Disp Refills   ALPRAZolam (XANAX) 0.25 MG tablet 20 tablet 0    Sig: Take 1 tablet by mouth daily as needed for anxiety     Not Delegated - Psychiatry:  Anxiolytics/Hypnotics Failed - 01/08/2022  7:38 PM      Failed - This refill cannot be delegated      Failed - Urine Drug Screen completed in last 360 days      Passed - Valid encounter within last 6 months    Recent Outpatient Visits           5 months ago Class 1 obesity due to excess calories with serious comorbidity and body mass index (BMI) of 31.0 to 31.9 in adult   [place], Coralie Keens, NP   7 months ago Encounter for general adult medical examination with abnormal findings   [place], NP       Future Appointments             In 6 days Baity, Coralie Keens, NP [facility]

## 2022-01-15 ENCOUNTER — Other Ambulatory Visit (HOSPITAL_COMMUNITY): Payer: Self-pay

## 2022-01-15 ENCOUNTER — Other Ambulatory Visit: Payer: Self-pay | Admitting: Internal Medicine

## 2022-01-15 ENCOUNTER — Ambulatory Visit: Payer: No Typology Code available for payment source | Admitting: Internal Medicine

## 2022-01-15 DIAGNOSIS — I10 Essential (primary) hypertension: Secondary | ICD-10-CM

## 2022-01-15 MED ORDER — HYDROCHLOROTHIAZIDE 25 MG PO TABS
ORAL_TABLET | Freq: Every day | ORAL | 0 refills | Status: DC
  Filled 2022-01-15: qty 90, 90d supply, fill #0

## 2022-01-15 NOTE — Telephone Encounter (Signed)
Requested Prescriptions  Pending Prescriptions Disp Refills   hydrochlorothiazide (HYDRODIURIL) 25 MG tablet 90 tablet 0    Sig: TAKE 1 TABLET BY MOUTH ONCE DAILY     Cardiovascular: Diuretics - Thiazide Passed - 01/15/2022  9:17 AM      Passed - Ca in normal range and within 360 days    Calcium  Date Value Ref Range Status  05/15/2021 9.7 8.6 - 10.4 mg/dL Final   Calcium, Ion  Date Value Ref Range Status  09/11/2017 1.12 (L) 1.15 - 1.40 mmol/L Final         Passed - Cr in normal range and within 360 days    Creat  Date Value Ref Range Status  05/15/2021 0.87 0.50 - 1.05 mg/dL Final    Comment:    For patients >56 years of age, the reference limit for Creatinine is approximately 13% higher for people identified as African-American. .          Passed - K in normal range and within 360 days    Potassium  Date Value Ref Range Status  05/15/2021 3.7 3.5 - 5.3 mmol/L Final         Passed - Na in normal range and within 360 days    Sodium  Date Value Ref Range Status  05/15/2021 143 135 - 146 mmol/L Final         Passed - Last BP in normal range    BP Readings from Last 1 Encounters:  08/08/21 123/68         Passed - Valid encounter within last 6 months    Recent Outpatient Visits          5 months ago Class 1 obesity due to excess calories with serious comorbidity and body mass index (BMI) of 31.0 to 31.9 in adult   [place], Coralie Keens, NP   8 months ago Encounter for general adult medical examination with abnormal findings   [facility] [place], Coralie Keens, NP

## 2022-01-16 ENCOUNTER — Other Ambulatory Visit: Payer: Self-pay | Admitting: Internal Medicine

## 2022-01-16 NOTE — Telephone Encounter (Signed)
Requested medication (s) are due for refill today: yes  Requested medication (s) are on the active medication list: yes  Last refill:  12/19/21  Future visit scheduled: no  Notes to clinic:  Pt got a curtesy refill and then canceled her appt, there is no upcoming appt scheduled. Please assess.   Requested Prescriptions  Pending Prescriptions Disp Refills   Liraglutide -Weight Management (SAXENDA) 18 MG/3ML SOPN 15 mL 0    Sig: Inject 3 mg into the skin daily.     Endocrinology:  Diabetes - GLP-1 Receptor Agonists Failed - 01/16/2022 11:10 AM      Failed - HBA1C is between 0 and 7.9 and within 180 days    Hgb A1c MFr Bld  Date Value Ref Range Status  05/15/2021 5.8 (H) <5.7 % of total Hgb Final    Comment:    For someone without known diabetes, a hemoglobin  A1c value between 5.7% and 6.4% is consistent with prediabetes and should be confirmed with a  follow-up test. . For someone with known diabetes, a value <7% indicates that their diabetes is well controlled. A1c targets should be individualized based on duration of diabetes, age, comorbid conditions, and other considerations. . This assay result is consistent with an increased risk of diabetes. . Currently, no consensus exists regarding use of hemoglobin A1c for diagnosis of diabetes for children. Renella Cunas - Valid encounter within last 6 months    Recent Outpatient Visits           5 months ago Class 1 obesity due to excess calories with serious comorbidity and body mass index (BMI) of 31.0 to 31.9 in adult   [place], Coralie Keens, NP   8 months ago Encounter for general adult medical examination with abnormal findings   [facility] [place], Coralie Keens, NP

## 2022-01-18 ENCOUNTER — Other Ambulatory Visit (HOSPITAL_COMMUNITY): Payer: Self-pay

## 2022-01-19 ENCOUNTER — Other Ambulatory Visit: Payer: Self-pay | Admitting: Internal Medicine

## 2022-01-21 ENCOUNTER — Other Ambulatory Visit (HOSPITAL_COMMUNITY): Payer: Self-pay

## 2022-01-21 NOTE — Telephone Encounter (Signed)
Requested medication (s) are due for refill today: yes  Requested medication (s) are on the active medication list: yes  Last refill:  12/19/21 #55ml 0 refills   Future visit scheduled: no  Notes to clinic:  called patient to schedule appt. No answer, LVMTCB . Do you want to refill Rx?     Requested Prescriptions  Pending Prescriptions Disp Refills   Liraglutide -Weight Management (SAXENDA) 18 MG/3ML SOPN 15 mL 0    Sig: Inject 3 mg into the skin daily.     Endocrinology:  Diabetes - GLP-1 Receptor Agonists Failed - 01/19/2022  7:38 PM      Failed - HBA1C is between 0 and 7.9 and within 180 days    Hgb A1c MFr Bld  Date Value Ref Range Status  05/15/2021 5.8 (H) <5.7 % of total Hgb Final    Comment:    For someone without known diabetes, a hemoglobin  A1c value between 5.7% and 6.4% is consistent with prediabetes and should be confirmed with a  follow-up test. . For someone with known diabetes, a value <7% indicates that their diabetes is well controlled. A1c targets should be individualized based on duration of diabetes, age, comorbid conditions, and other considerations. . This assay result is consistent with an increased risk of diabetes. . Currently, no consensus exists regarding use of hemoglobin A1c for diagnosis of diabetes for children. Renella Cunas - Valid encounter within last 6 months    Recent Outpatient Visits           5 months ago Class 1 obesity due to excess calories with serious comorbidity and body mass index (BMI) of 31.0 to 31.9 in adult   [place], Coralie Keens, NP   8 months ago Encounter for general adult medical examination with abnormal findings   [facility] [place], Coralie Keens, NP

## 2022-01-21 NOTE — Telephone Encounter (Signed)
Called patient to schedule appt. No answer , LVMTCB [phone].

## 2022-01-30 ENCOUNTER — Other Ambulatory Visit: Payer: Self-pay | Admitting: Internal Medicine

## 2022-01-31 ENCOUNTER — Other Ambulatory Visit (HOSPITAL_COMMUNITY): Payer: Self-pay

## 2022-01-31 MED ORDER — CITALOPRAM HYDROBROMIDE 40 MG PO TABS
ORAL_TABLET | Freq: Every day | ORAL | 0 refills | Status: DC
  Filled 2022-01-31: qty 30, 30d supply, fill #0

## 2022-01-31 NOTE — Telephone Encounter (Signed)
Patient will need an office visit for further refills. Courtesy refill. Requested Prescriptions  Pending Prescriptions Disp Refills   citalopram (CELEXA) 40 MG tablet 30 tablet 0    Sig: TAKE 1 TABLET BY MOUTH DAILY     Psychiatry:  Antidepressants - SSRI Passed - 01/30/2022  9:59 PM      Passed - Completed PHQ-2 or PHQ-9 in the last 360 days      Passed - Valid encounter within last 6 months    Recent Outpatient Visits          5 months ago Class 1 obesity due to excess calories with serious comorbidity and body mass index (BMI) of 31.0 to 31.9 in adult   [place], Coralie Keens, NP   8 months ago Encounter for general adult medical examination with abnormal findings   [facility] [place], Coralie Keens, NP

## 2022-02-17 ENCOUNTER — Other Ambulatory Visit: Payer: Self-pay | Admitting: Internal Medicine

## 2022-02-17 DIAGNOSIS — F419 Anxiety disorder, unspecified: Secondary | ICD-10-CM

## 2022-02-18 ENCOUNTER — Other Ambulatory Visit (HOSPITAL_COMMUNITY): Payer: Self-pay

## 2022-02-19 ENCOUNTER — Other Ambulatory Visit (HOSPITAL_COMMUNITY): Payer: Self-pay

## 2022-02-19 ENCOUNTER — Other Ambulatory Visit: Payer: Self-pay | Admitting: Internal Medicine

## 2022-02-19 DIAGNOSIS — M109 Gout, unspecified: Secondary | ICD-10-CM

## 2022-02-19 MED ORDER — ALLOPURINOL 100 MG PO TABS
100.0000 mg | ORAL_TABLET | Freq: Every day | ORAL | 1 refills | Status: DC
  Filled 2022-02-19: qty 90, 90d supply, fill #0

## 2022-02-19 MED ORDER — ALPRAZOLAM 0.25 MG PO TABS
0.2500 mg | ORAL_TABLET | Freq: Every day | ORAL | 0 refills | Status: DC | PRN
  Filled 2022-02-19: qty 20, 20d supply, fill #0

## 2022-02-19 MED ORDER — COLCHICINE 0.6 MG PO TABS
ORAL_TABLET | ORAL | 0 refills | Status: DC
  Filled 2022-02-19: qty 90, 70d supply, fill #0

## 2022-02-19 NOTE — Telephone Encounter (Signed)
Requested Prescriptions  ?Pending Prescriptions Disp Refills  ?? colchicine 0.6 MG tablet 30 tablet 0  ?  Sig: TAKE 1 TABLET BY MOUTH 2 TIMES DAILY FOR 7-10 DAYS AND THEN AS NEEDED FOR ATTACKS.  ?  ? Endocrinology:  Gout Agents - colchicine Failed - 02/19/2022  9:24 AM  ?  ?  Failed - CBC within normal limits and completed in the last 12 months  ?  WBC  ?Date Value Ref Range Status  ?05/15/2021 7.1 3.8 - 10.8 Thousand/uL Final  ? ?RBC  ?Date Value Ref Range Status  ?05/15/2021 5.26 (H) 3.80 - 5.10 Million/uL Final  ? ?Hemoglobin  ?Date Value Ref Range Status  ?05/15/2021 15.1 11.7 - 15.5 g/dL Final  ? ?HCT  ?Date Value Ref Range Status  ?05/15/2021 46.2 (H) 35.0 - 45.0 % Final  ? ?MCHC  ?Date Value Ref Range Status  ?05/15/2021 32.7 32.0 - 36.0 g/dL Final  ? ?MCH  ?Date Value Ref Range Status  ?05/15/2021 28.7 27.0 - 33.0 pg Final  ? ?MCV  ?Date Value Ref Range Status  ?05/15/2021 87.8 80.0 - 100.0 fL Final  ? ?No results found for: PLTCOUNTKUC, LABPLAT, [place] ?RDW  ?Date Value Ref Range Status  ?05/15/2021 13.6 11.0 - 15.0 % Final  ? ?  ?  ?  Passed - Cr in normal range and within 360 days  ?  Creat  ?Date Value Ref Range Status  ?05/15/2021 0.87 0.50 - 1.05 mg/dL Final  ?  Comment:  ?  For patients >50 years of age, the reference limit ?for Creatinine is approximately 13% higher for people ?identified as African-American. ?. ?  ?   ?  ?  Passed - ALT in normal range and within 360 days  ?  ALT  ?Date Value Ref Range Status  ?05/15/2021 20 6 - 29 U/L Final  ?   ?  ?  Passed - AST in normal range and within 360 days  ?  AST  ?Date Value Ref Range Status  ?05/15/2021 19 10 - 35 U/L Final  ?   ?  ?  Passed - Valid encounter within last 12 months  ?  Recent Outpatient Visits   ?      ? 6 months ago Class 1 obesity due to excess calories with serious comorbidity and body mass index (BMI) of 31.0 to 31.9 in adult  ? [facility] [place], Coralie Keens, NP  ? 9 months ago Encounter for general adult medical  examination with abnormal findings  ? [facility] [place], Coralie Keens, NP  ?  ?  ? ?  ?  ?  ? ? ?

## 2022-02-19 NOTE — Telephone Encounter (Signed)
Requested medication (s) are due for refill today: Yes ? ?Requested medication (s) are on the active medication list: Yes ? ?Last refill:  01/09/22 ? ?Future visit scheduled: No ? ?Notes to clinic:  See request. ? ? ? ?Requested Prescriptions  ?Pending Prescriptions Disp Refills  ? ALPRAZolam (XANAX) 0.25 MG tablet 20 tablet 0  ?  Sig: Take 1 tablet by mouth daily as needed for anxiety  ?  ? Not Delegated - Psychiatry: Anxiolytics/Hypnotics 2 Failed - 02/17/2022  3:32 PM  ?  ?  Failed - This refill cannot be delegated  ?  ?  Failed - Urine Drug Screen completed in last 360 days  ?  ?  Failed - Valid encounter within last 6 months  ?  Recent Outpatient Visits   ? ?      ? 6 months ago Class 1 obesity due to excess calories with serious comorbidity and body mass index (BMI) of 31.0 to 31.9 in adult  ? [facility] [place], Coralie Keens, NP  ? 9 months ago Encounter for general adult medical examination with abnormal findings  ? [facility] [place], Coralie Keens, NP  ? ?  ?  ? ?  ?  ?  Passed - Patient is not pregnant  ?  ?  ?Signed Prescriptions Disp Refills  ? allopurinol (ZYLOPRIM) 100 MG tablet 90 tablet 1  ?  Sig: Take 1 tablet by mouth daily.  ?  ? Endocrinology:  Gout Agents - allopurinol Failed - 02/17/2022  3:32 PM  ?  ?  Failed - Uric Acid in normal range and within 360 days  ?  Uric Acid, Serum  ?Date Value Ref Range Status  ?05/15/2021 8.7 (H) 2.5 - 7.0 mg/dL Final  ?  Comment:  ?  Therapeutic target for gout patients: <6.0 mg/dL ?. ?  ?  ?  ?  ?  Failed - CBC within normal limits and completed in the last 12 months  ?  WBC  ?Date Value Ref Range Status  ?05/15/2021 7.1 3.8 - 10.8 Thousand/uL Final  ? ?RBC  ?Date Value Ref Range Status  ?05/15/2021 5.26 (H) 3.80 - 5.10 Million/uL Final  ? ?Hemoglobin  ?Date Value Ref Range Status  ?05/15/2021 15.1 11.7 - 15.5 g/dL Final  ? ?HCT  ?Date Value Ref Range Status  ?05/15/2021 46.2 (H) 35.0 - 45.0 % Final  ? ?MCHC  ?Date Value Ref Range Status   ?05/15/2021 32.7 32.0 - 36.0 g/dL Final  ? ?MCH  ?Date Value Ref Range Status  ?05/15/2021 28.7 27.0 - 33.0 pg Final  ? ?MCV  ?Date Value Ref Range Status  ?05/15/2021 87.8 80.0 - 100.0 fL Final  ? ?No results found for: PLTCOUNTKUC, LABPLAT, [place] ?RDW  ?Date Value Ref Range Status  ?05/15/2021 13.6 11.0 - 15.0 % Final  ? ?  ?  ?  Passed - Cr in normal range and within 360 days  ?  Creat  ?Date Value Ref Range Status  ?05/15/2021 0.87 0.50 - 1.05 mg/dL Final  ?  Comment:  ?  For patients >49 years of age, the reference limit ?for Creatinine is approximately 13% higher for people ?identified as African-American. ?. ?  ?  ?  ?  ?  Passed - Valid encounter within last 12 months  ?  Recent Outpatient Visits   ? ?      ? 6 months ago Class 1 obesity due to excess calories with serious comorbidity and body  mass index (BMI) of 31.0 to 31.9 in adult  ? [facility] [place], Coralie Keens, NP  ? 9 months ago Encounter for general adult medical examination with abnormal findings  ? [facility] [place], Coralie Keens, NP  ? ?  ?  ? ?  ?  ?  ? ?

## 2022-02-20 ENCOUNTER — Other Ambulatory Visit (HOSPITAL_COMMUNITY): Payer: Self-pay

## 2022-03-20 ENCOUNTER — Other Ambulatory Visit: Payer: Self-pay | Admitting: Internal Medicine

## 2022-03-21 ENCOUNTER — Other Ambulatory Visit (HOSPITAL_COMMUNITY): Payer: Self-pay

## 2022-03-21 MED ORDER — CITALOPRAM HYDROBROMIDE 40 MG PO TABS
ORAL_TABLET | Freq: Every day | ORAL | 0 refills | Status: DC
  Filled 2022-03-21: qty 30, 30d supply, fill #0

## 2022-03-21 NOTE — Telephone Encounter (Signed)
Requested medication (s) are due for refill today - yes ? ?Requested medication (s) are on the active medication list -yes ? ?Future visit scheduled -yes ? ?Last refill: 01/31/22 #30 ? ?Notes to clinic: Request RF: Call to patient - appointment scheduled- last RF has notes- sent for review  ? ?Requested Prescriptions  ?Pending Prescriptions Disp Refills  ? citalopram (CELEXA) 40 MG tablet 30 tablet 0  ?  Sig: TAKE 1 TABLET BY MOUTH DAILY  ?  ? Psychiatry:  Antidepressants - SSRI Failed - 03/20/2022 10:17 PM  ?  ?  Failed - Valid encounter within last 6 months  ?  Recent Outpatient Visits   ? ?      ? 7 months ago Class 1 obesity due to excess calories with serious comorbidity and body mass index (BMI) of 31.0 to 31.9 in adult  ? [facility] [place], Coralie Keens, NP  ? 10 months ago Encounter for general adult medical examination with abnormal findings  ? [facility] [place], Coralie Keens, NP  ? ?  ?  ?Future Appointments   ? ?        ? In 4 days Baity, Coralie Keens, NP [facility] [place]  ? ?  ? ?  ?  ?  Passed - Completed PHQ-2 or PHQ-9 in the last 360 days  ?  ?  ? ? ? ?Requested Prescriptions  ?Pending Prescriptions Disp Refills  ? citalopram (CELEXA) 40 MG tablet 30 tablet 0  ?  Sig: TAKE 1 TABLET BY MOUTH DAILY  ?  ? Psychiatry:  Antidepressants - SSRI Failed - 03/20/2022 10:17 PM  ?  ?  Failed - Valid encounter within last 6 months  ?  Recent Outpatient Visits   ? ?      ? 7 months ago Class 1 obesity due to excess calories with serious comorbidity and body mass index (BMI) of 31.0 to 31.9 in adult  ? [facility] [place], Coralie Keens, NP  ? 10 months ago Encounter for general adult medical examination with abnormal findings  ? [facility] [place], Coralie Keens, NP  ? ?  ?  ?Future Appointments   ? ?        ? In 4 days Baity, Coralie Keens, NP [facility] [place]  ? ?  ? ?  ?  ?  Passed - Completed PHQ-2 or PHQ-9 in the last 360 days  ?  ?  ? ? ? ?

## 2022-03-25 ENCOUNTER — Telehealth (INDEPENDENT_AMBULATORY_CARE_PROVIDER_SITE_OTHER): Payer: No Typology Code available for payment source | Admitting: Internal Medicine

## 2022-03-25 ENCOUNTER — Encounter: Payer: Self-pay | Admitting: Internal Medicine

## 2022-03-25 ENCOUNTER — Other Ambulatory Visit (HOSPITAL_COMMUNITY): Payer: Self-pay

## 2022-03-25 DIAGNOSIS — G7000 Myasthenia gravis without (acute) exacerbation: Secondary | ICD-10-CM | POA: Diagnosis not present

## 2022-03-25 DIAGNOSIS — R7303 Prediabetes: Secondary | ICD-10-CM

## 2022-03-25 DIAGNOSIS — M10079 Idiopathic gout, unspecified ankle and foot: Secondary | ICD-10-CM

## 2022-03-25 DIAGNOSIS — I10 Essential (primary) hypertension: Secondary | ICD-10-CM | POA: Diagnosis not present

## 2022-03-25 DIAGNOSIS — E6609 Other obesity due to excess calories: Secondary | ICD-10-CM

## 2022-03-25 DIAGNOSIS — F419 Anxiety disorder, unspecified: Secondary | ICD-10-CM

## 2022-03-25 DIAGNOSIS — F5101 Primary insomnia: Secondary | ICD-10-CM

## 2022-03-25 DIAGNOSIS — Z6831 Body mass index (BMI) 31.0-31.9, adult: Secondary | ICD-10-CM

## 2022-03-25 DIAGNOSIS — E7800 Pure hypercholesterolemia, unspecified: Secondary | ICD-10-CM

## 2022-03-25 DIAGNOSIS — F32A Depression, unspecified: Secondary | ICD-10-CM

## 2022-03-25 DIAGNOSIS — R0683 Snoring: Secondary | ICD-10-CM

## 2022-03-25 MED ORDER — CITALOPRAM HYDROBROMIDE 40 MG PO TABS
ORAL_TABLET | Freq: Every day | ORAL | 0 refills | Status: DC
  Filled 2022-03-25: qty 90, fill #0
  Filled 2022-04-23: qty 90, 90d supply, fill #0

## 2022-03-25 MED ORDER — AMLODIPINE BESYLATE 5 MG PO TABS
5.0000 mg | ORAL_TABLET | Freq: Every day | ORAL | 0 refills | Status: DC
  Filled 2022-03-25: qty 90, 90d supply, fill #0

## 2022-03-25 MED ORDER — ATORVASTATIN CALCIUM 10 MG PO TABS
10.0000 mg | ORAL_TABLET | Freq: Every day | ORAL | 0 refills | Status: DC
  Filled 2022-03-25: qty 90, 90d supply, fill #0

## 2022-03-25 MED ORDER — HYDROCHLOROTHIAZIDE 25 MG PO TABS
ORAL_TABLET | Freq: Every day | ORAL | 0 refills | Status: DC
  Filled 2022-03-25: qty 90, 90d supply, fill #0

## 2022-03-25 MED ORDER — ALLOPURINOL 100 MG PO TABS
100.0000 mg | ORAL_TABLET | Freq: Every day | ORAL | 0 refills | Status: DC
  Filled 2022-03-25 – 2022-05-22 (×2): qty 90, 90d supply, fill #0

## 2022-03-25 NOTE — Patient Instructions (Signed)
Sleep Apnea ?Sleep apnea affects breathing during sleep. It causes breathing to stop for 10 seconds or more, or to become shallow. People with sleep apnea usually snore loudly. ?It can also increase the risk of: ?Heart attack. ?Stroke. ?Being very overweight (obese). ?Diabetes. ?Heart failure. ?Irregular heartbeat. ?High blood pressure. ?The goal of treatment is to help you breathe normally again. ?What are the causes? ?The most common cause of this condition is a collapsed or blocked airway. ?There are three kinds of sleep apnea: ?Obstructive sleep apnea. This is caused by a blocked or collapsed airway. ?Central sleep apnea. This happens when the brain does not send the right signals to the muscles that control breathing. ?Mixed sleep apnea. This is a combination of obstructive and central sleep apnea. ?What increases the risk? ?Being overweight. ?Smoking. ?Having a small airway. ?Being older. ?Being female. ?Drinking alcohol. ?Taking medicines to calm yourself (sedatives or tranquilizers). ?Having family members with the condition. ?Having a tongue or tonsils that are larger than normal. ?What are the signs or symptoms? ?Trouble staying asleep. ?Loud snoring. ?Headaches in the morning. ?Waking up gasping. ?Dry mouth or sore throat in the morning. ?Being sleepy or tired during the day. ?If you are sleepy or tired during the day, you may also: ?Not be able to focus your mind (concentrate). ?Forget things. ?Get angry a lot and have mood swings. ?Feel sad (depressed). ?Have changes in your personality. ?Have less interest in sex, if you are female. ?Be unable to have an erection, if you are female. ?How is this treated? ? ?Sleeping on your side. ?Using a medicine to get rid of mucus in your nose (decongestant). ?Avoiding the use of alcohol, medicines to help you relax, or certain pain medicines (narcotics). ?Losing weight, if needed. ?Changing your diet. ?Quitting smoking. ?Using a machine to open your airway while you  sleep, such as: ?An oral appliance. This is a mouthpiece that shifts your lower jaw forward. ?A CPAP device. This device blows air through a mask when you breathe out (exhale). ?An EPAP device. This has valves that you put in each nostril. ?A BIPAP device. This device blows air through a mask when you breathe in (inhale) and breathe out. ?Having surgery if other treatments do not work. ?Follow these instructions at home: ?Lifestyle ?Make changes that your doctor recommends. ?Eat a healthy diet. ?Lose weight if needed. ?Avoid alcohol, medicines to help you relax, and some pain medicines. ?Do not smoke or use any products that contain nicotine or tobacco. If you need help quitting, ask your doctor. ?General instructions ?Take over-the-counter and prescription medicines only as told by your doctor. ?If you were given a machine to use while you sleep, use it only as told by your doctor. ?If you are having surgery, make sure to tell your doctor you have sleep apnea. You may need to bring your device with you. ?Keep all follow-up visits. ?Contact a doctor if: ?The machine that you were given to use during sleep bothers you or does not seem to be working. ?You do not get better. ?You get worse. ?Get help right away if: ?Your chest hurts. ?You have trouble breathing in enough air. ?You have an uncomfortable feeling in your back, arms, or stomach. ?You have trouble talking. ?One side of your body feels weak. ?A part of your face is hanging down. ?These symptoms may be an emergency. Get help right away. Call your local emergency services (911 in the [place] ?Do not wait to see if the symptoms   will go away. ?Do not drive yourself to the hospital. ?Summary ?This condition affects breathing during sleep. ?The most common cause is a collapsed or blocked airway. ?The goal of treatment is to help you breathe normally while you sleep. ?This information is not intended to replace advice given to you by your health care provider. Make  sure you discuss any questions you have with your health care provider. ?Document Revised: 07/11/2021 Document Reviewed: 11/10/2020 ?Elsevier Patient Education ? 2022 [employer] ? ?

## 2022-03-25 NOTE — Assessment & Plan Note (Signed)
Will place referral to pulmonology for sleep study ?Continue Trazodone ?

## 2022-03-25 NOTE — Assessment & Plan Note (Signed)
Encouraged low carb diet and exercise for weight loss 

## 2022-03-25 NOTE — Assessment & Plan Note (Signed)
Controlled on Amlodipine and HCTZ ?Reinforced DASH diet and exercise for weight loss ?

## 2022-03-25 NOTE — Assessment & Plan Note (Signed)
Encouraged diet and exercise for weight loss ?

## 2022-03-25 NOTE — Progress Notes (Signed)
Patient ID: Regina Huber, female   DOB: February 25, 1961, 61 y.o.   MRN: [id number] ?Virtual Visit via Video Note ? ?I connected with JAKHIYA BROWER on 03/25/22 at 11:20 AM EDT by a video enabled telemedicine application and verified that I am speaking with the correct person using two identifiers. ? ?Location: ?Patient: Work ?Provider: Office ? ?Person's participating in this video call: [name], NP and Mindi Slicker.  ?  ?I discussed the limitations of evaluation and management by telemedicine and the availability of in person appointments. The patient expressed understanding and agreed to proceed. ? ?History of Present Illness: ? ?Pt due to follow up chronic conditions. ?  ?Anxiety and Depression: Chronic, managed on Citalopram and Xanax. She is not currently seeing a therapist. She denies SI/HI. ? ?Gout: She denies recent flare on Allopurinol. She has Colchicine to take as needed. She does not follow with rheumatology. ? ?HLD: Her last LDL was 127, triglycerides 197, 04/2021. She denies myalgias on Atorvastatin. She tries to consume a low fat diet. ? ?HTN: She is taking Amlodipine and HCTZ as prescribed. ECG from  11/2016 reviewed. ? ?Insomnia: She does have some concerns for sleep apnea. She is snoring at night. She takes Trazadone as prescribed. There is no sleep studying of file. ? ?Myasthenia Gravis: Asymptomatic s/p thymectomy. ? ?Prediabetes: Her last A1C was 5.8%, 04/2021. She is not taking any oral diabetic medication at this time. She does not check her sugars. ? ? ?Past Medical History:  ?Diagnosis Date  ? Allergy   ? mild - no meds   ? Anemia   ? with twin pregnancy   ? Depression   ? Dyspnea   ? slight sob when lying flat  ? History of vaginal dysplasia   ? 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 Hyperlipidemia   ? Hypertension   ? Myasthenia gravis ([place])   ? dx 2005   s/p  thymectomy 2006--  per pt no other symtpoms since  ? Neck stiffness   ? Palpitations   ? Transfusion  history   ? s/p hysterectomy. pt states she received 9 pints of blood  ? Vaginal intraepithelial neoplasia III (VAIN III)   ? ? ?Current Outpatient Medications  ?Medication Sig Dispense Refill  ? allopurinol (ZYLOPRIM) 100 MG tablet Take 1 tablet by mouth daily. 90 tablet 1  ? ALPRAZolam (XANAX) 0.25 MG tablet Take 1 tablet by mouth daily as needed for anxiety 20 tablet 0  ? amLODipine (NORVASC) 5 MG tablet Take 1 tablet (5 mg total) by mouth daily. 90 tablet 0  ? atorvastatin (LIPITOR) 10 MG tablet Take 1 tablet (10 mg total) by mouth daily. 90 tablet 2  ? citalopram (CELEXA) 40 MG tablet TAKE 1 TABLET BY MOUTH DAILY 30 tablet 0  ? colchicine 0.6 MG tablet TAKE 1 TABLET BY MOUTH 2 TIMES DAILY FOR 7-10 DAYS AND THEN AS NEEDED FOR ATTACKS. 90 tablet 0  ? hydrochlorothiazide (HYDRODIURIL) 25 MG tablet TAKE 1 TABLET BY MOUTH ONCE DAILY 90 tablet 0  ? Multiple Vitamin (MULTIVITAMIN) tablet Take 1 tablet by mouth daily.    ? nystatin-triamcinolone ointment (MYCOLOG) APPLY TO THE AFFECTED AREA(S) TOPICALLY 2 TIMES PER DAY AS NEEDED 30 g 2  ? traZODone (DESYREL) 50 MG tablet Take 1 tablet (50 mg total) by mouth at bedtime. 90 tablet 0  ? Insulin Pen Needle 31G X 5 MM MISC Use daily as directed (Patient not taking: Reported on 03/25/2022)  100 each 0  ? Liraglutide -Weight Management (SAXENDA) 18 MG/3ML SOPN Inject 3 mg into the skin daily. (Patient not taking: Reported on 03/25/2022) 15 mL 0  ? ?No current facility-administered medications for this visit.  ? ? ?Allergies  ?Allergen Reactions  ? Doxycycline Other (See Comments)  ?  Oral blisters  ? Penicillins Rash  ?  - has tolerated cephalosporins  ? ? ?Family History  ?Problem Relation Age of Onset  ? Hypertension Mother   ? Diabetes Mother   ? Breast cancer Mother   ? Alcohol abuse Father   ? Hypertension Father   ? Stroke Father   ? Colon cancer Father 5  ? Heart disease Brother 100  ?     MI  ? Colon polyps Brother   ? Colon polyps Sister   ? Colon polyps Sister   ?  Colon polyps Brother   ? Esophageal cancer Neg Hx   ? Rectal cancer Neg Hx   ? Stomach cancer Neg Hx   ? ? ?Social History  ? ?Socioeconomic History  ? Marital status: Single  ?  Spouse name: Not on file  ? Number of children: Not on file  ? Years of education: Not on file  ? Highest education level: Not on file  ?Occupational History  ? Not on file  ?Tobacco Use  ? Smoking status: Never  ? Smokeless tobacco: Never  ?Vaping Use  ? Vaping Use: Never used  ?Substance and Sexual Activity  ? Alcohol use: Yes  ?  Comment: rarely  ? Drug use: No  ? Sexual activity: Not on file  ?Other Topics Concern  ? Not on file  ?Social History Narrative  ? Married, twin daughters born 64, son born 76  ? Works at [employer] in [employer], mining.  ? ?Social Determinants of Health  ? ?Financial Resource Strain: Not on file  ?Food Insecurity: Not on file  ?Transportation Needs: Not on file  ?Physical Activity: Not on file  ?Stress: Not on file  ?Social Connections: Not on file  ?Intimate Partner Violence: Not on file  ? ? ? ?Constitutional: Denies fever, malaise, fatigue, headache or abrupt weight changes.  ?HEENT: Denies eye pain, eye redness, ear pain, ringing in the ears, wax buildup, runny nose, nasal congestion, bloody nose, or sore throat. ?Respiratory: Denies difficulty breathing, shortness of breath, cough or sputum production.   ?Cardiovascular: Denies chest pain, chest tightness, palpitations or swelling in the hands or feet.  ?Gastrointestinal: Denies abdominal pain, bloating, constipation, diarrhea or blood in the stool.  ?GU: Denies urgency, frequency, pain with urination, burning sensation, blood in urine, odor or discharge. ?Musculoskeletal: Denies decrease in range of motion, difficulty with gait, muscle pain or joint pain and swelling.  ?Skin: Denies redness, rashes, lesions or ulcercations.  ?Neurological: Pt reports insomnia, snoring. Denies dizziness, difficulty with memory, difficulty with speech or problems with balance and  coordination.  ?Psych: Pt has a history of anxiety and depression. Denies SI/HI. ? ?No other specific complaints in a complete review of systems (except as listed in HPI above). ? ?Observations/Objective: ? ? ?Wt Readings from Last 3 Encounters:  ?08/08/21 195 lb 12.8 oz (88.8 kg)  ?05/15/21 202 lb 3.2 oz (91.7 kg)  ?02/22/21 204 lb (92.5 kg)  ? ? ?General: Appears her stated age, obese, in NAD. ?HEENT: Head: normal shape and size; Eyes: EOMs intact; ?Cardiovascular: Normal rate. ?Pulmonary/Chest: Normal effort. No respiratory distress.  ?Neurological: Alert and oriented.  ?Psychiatric: Mood and affect normal. Behavior is  normal. Judgment and thought content normal.  ? ? ?BMET ?   ?Component Value Date/Time  ? NA 143 05/15/2021 0822  ? K 3.7 05/15/2021 0822  ? CL 103 05/15/2021 0822  ? CO2 29 05/15/2021 0822  ? GLUCOSE 91 05/15/2021 0822  ? BUN 14 05/15/2021 0822  ? CREATININE 0.87 05/15/2021 0822  ? CALCIUM 9.7 05/15/2021 0822  ? GFRNONAA 73 05/15/2021 0822  ? GFRAA 85 05/15/2021 0822  ? ? ?Lipid Panel  ?   ?Component Value Date/Time  ? CHOL 218 (H) 05/15/2021 3546  ? TRIG 197 (H) 05/15/2021 5681  ? HDL 57 05/15/2021 0822  ? CHOLHDL 3.8 05/15/2021 0822  ? VLDL 38.4 09/20/2019 1004  ? LDLCALC 127 (H) 05/15/2021 2751  ? ? ?CBC ?   ?Component Value Date/Time  ? WBC 7.1 05/15/2021 0822  ? RBC 5.26 (H) 05/15/2021 7001  ? HGB 15.1 05/15/2021 0822  ? HCT 46.2 (H) 05/15/2021 7494  ? PLT 283 05/15/2021 0822  ? MCV 87.8 05/15/2021 0822  ? MCH 28.7 05/15/2021 0822  ? MCHC 32.7 05/15/2021 0822  ? RDW 13.6 05/15/2021 0822  ? LYMPHSABS 1.9 09/20/2019 1004  ? MONOABS 0.5 09/20/2019 1004  ? EOSABS 0.2 09/20/2019 1004  ? BASOSABS 0.0 09/20/2019 1004  ? ? ?Hgb A1C ?Lab Results  ?Component Value Date  ? HGBA1C 5.8 (H) 05/15/2021  ? ? ? ? ? ? ?Assessment and Plan: ? ? ?Follow Up Instructions: ? ?  ?I discussed the assessment and treatment plan with the patient. The patient was provided an opportunity to ask questions and all were  answered. The patient agreed with the plan and demonstrated an understanding of the instructions. ?  ?The patient was advised to call back or seek an in-person evaluation if the symptoms worsen or if the conditio

## 2022-03-25 NOTE — Assessment & Plan Note (Signed)
Stable on Citalopram and Xanax ?Support offered ?

## 2022-03-25 NOTE — Assessment & Plan Note (Signed)
Continue Allopurinol ?Has not needed Colchicine ?

## 2022-03-25 NOTE — Assessment & Plan Note (Signed)
Encouraged her to consume a low fat diet Continue Atorvastatin 

## 2022-03-25 NOTE — Assessment & Plan Note (Signed)
Asymptomatic, not medicated ?

## 2022-03-28 ENCOUNTER — Other Ambulatory Visit (HOSPITAL_COMMUNITY): Payer: Self-pay

## 2022-03-28 ENCOUNTER — Other Ambulatory Visit: Payer: Self-pay | Admitting: Internal Medicine

## 2022-03-28 MED ORDER — TRAZODONE HCL 50 MG PO TABS
50.0000 mg | ORAL_TABLET | Freq: Every evening | ORAL | 0 refills | Status: DC
Start: 2022-03-28 — End: 2022-06-21
  Filled 2022-03-28: qty 90, 90d supply, fill #0

## 2022-03-28 NOTE — Telephone Encounter (Signed)
Requested Prescriptions  ?Pending Prescriptions Disp Refills  ?? traZODone (DESYREL) 50 MG tablet 90 tablet 0  ?  Sig: Take 1 tablet (50 mg total) by mouth at bedtime.  ?  ? Psychiatry: Antidepressants - Serotonin Modulator Passed - 03/28/2022 11:11 AM  ?  ?  Passed - Completed PHQ-2 or PHQ-9 in the last 360 days  ?  ?  Passed - Valid encounter within last 6 months  ?  Recent Outpatient Visits   ?      ? 3 days ago Primary hypertension  ? [facility] [place], [place] W, NP  ? 7 months ago Class 1 obesity due to excess calories with serious comorbidity and body mass index (BMI) of 31.0 to 31.9 in adult  ? [facility] [place], Coralie Keens, NP  ? 10 months ago Encounter for general adult medical examination with abnormal findings  ? [facility] [place], Coralie Keens, NP  ?  ?  ? ?  ?  ?  ? ?

## 2022-04-01 ENCOUNTER — Other Ambulatory Visit (HOSPITAL_COMMUNITY): Payer: Self-pay

## 2022-04-02 ENCOUNTER — Ambulatory Visit: Payer: Self-pay

## 2022-04-02 NOTE — Telephone Encounter (Signed)
Patient experiencing knot on right foot, redness and swollen for 2 days, patient requesting a x ray, patient denies hitting her foot on anything.  PCP has no available appointments  until 4/20 seeking clinical advice  ? ? ?Chief Complaint: Right foot pain,swelling,redness ?Symptoms: Has noticed "a knot on my foot." ?Frequency: Started 2 days ago ?Pertinent Negatives: Patient denies fever ?Disposition: '[]'$ ED /'[]'$ Urgent Care (no appt availability in office) / '[x]'$ Appointment(In office/virtual)/ '[]'$  [place] [facility]/ '[]'$ Home Care/ '[]'$ Refused Recommended Disposition /'[]'$  Mobile Bus/ '[]'$  Follow-up with PCP ?Additional Notes:   ?Reason for Disposition ? [1] MODERATE pain (e.g., interferes with normal activities, limping) AND [2] present > 3 days ? ?Answer Assessment - Initial Assessment Questions ?1. ONSET: "When did the pain start?"  ?    2 days ago ?2. LOCATION: "Where is the pain located?"  ?    Top of right foot ?3. PAIN: "How bad is the pain?"    (Scale 1-10; or mild, moderate, severe) ? - MILD (1-3): doesn't interfere with normal activities.  ? - MODERATE (4-7): interferes with normal activities (e.g., work or school) or awakens from sleep, limping.  ? - SEVERE (8-10): excruciating pain, unable to do any normal activities, unable to walk.  ?    Now - 7 ?4. WORK OR EXERCISE: "Has there been any recent work or exercise that involved this part of the body?"  ?    No ?5. CAUSE: "What do you think is causing the foot pain?" ?    Unsure ?6. OTHER SYMPTOMS: "Do you have any other symptoms?" (e.g., leg pain, rash, fever, numbness) ?    Swelling ?7. PREGNANCY: "Is there any chance you are pregnant?" "When was your last menstrual period?" ?    No ? ?Protocols used: Foot Pain-A-AH ? ?

## 2022-04-04 ENCOUNTER — Ambulatory Visit: Payer: No Typology Code available for payment source | Admitting: Internal Medicine

## 2022-04-24 ENCOUNTER — Other Ambulatory Visit (HOSPITAL_COMMUNITY): Payer: Self-pay

## 2022-05-15 ENCOUNTER — Other Ambulatory Visit (HOSPITAL_COMMUNITY): Payer: Self-pay

## 2022-05-15 MED ORDER — FLUTICASONE PROPIONATE 50 MCG/ACT NA SUSP
NASAL | 12 refills | Status: DC
  Filled 2022-05-15: qty 16, 30d supply, fill #0

## 2022-05-20 ENCOUNTER — Encounter: Payer: Self-pay | Admitting: Internal Medicine

## 2022-05-22 ENCOUNTER — Other Ambulatory Visit (HOSPITAL_COMMUNITY): Payer: Self-pay

## 2022-05-28 ENCOUNTER — Encounter: Payer: Self-pay | Admitting: Internal Medicine

## 2022-05-28 ENCOUNTER — Ambulatory Visit (INDEPENDENT_AMBULATORY_CARE_PROVIDER_SITE_OTHER): Payer: No Typology Code available for payment source | Admitting: Internal Medicine

## 2022-05-28 ENCOUNTER — Other Ambulatory Visit: Payer: Self-pay | Admitting: Internal Medicine

## 2022-05-28 VITALS — BP 136/92 | HR 58 | Temp 96.8°F | Ht 67.0 in | Wt 193.0 lb

## 2022-05-28 DIAGNOSIS — E782 Mixed hyperlipidemia: Secondary | ICD-10-CM

## 2022-05-28 DIAGNOSIS — G7000 Myasthenia gravis without (acute) exacerbation: Secondary | ICD-10-CM | POA: Diagnosis not present

## 2022-05-28 DIAGNOSIS — M109 Gout, unspecified: Secondary | ICD-10-CM

## 2022-05-28 DIAGNOSIS — Z6830 Body mass index (BMI) 30.0-30.9, adult: Secondary | ICD-10-CM

## 2022-05-28 DIAGNOSIS — E6609 Other obesity due to excess calories: Secondary | ICD-10-CM

## 2022-05-28 DIAGNOSIS — Z0001 Encounter for general adult medical examination with abnormal findings: Secondary | ICD-10-CM | POA: Diagnosis not present

## 2022-05-28 DIAGNOSIS — I10 Essential (primary) hypertension: Secondary | ICD-10-CM

## 2022-05-28 DIAGNOSIS — D751 Secondary polycythemia: Secondary | ICD-10-CM | POA: Insufficient documentation

## 2022-05-28 DIAGNOSIS — F419 Anxiety disorder, unspecified: Secondary | ICD-10-CM

## 2022-05-28 NOTE — Assessment & Plan Note (Signed)
Asymptomatic We will monitor

## 2022-05-28 NOTE — Assessment & Plan Note (Signed)
Encourage diet and exercise for weight loss 

## 2022-05-28 NOTE — Progress Notes (Addendum)
Subjective:    Patient ID: Regina Huber, female    DOB: 1961-03-30, 61 y.o.   MRN: [id number]  HPI  Patient presents to clinic today for her annual exam.  Flu: 09/2021 Tetanus: 03/2016 COVID: [place] x2 Shingrix: 05/2021, 07/2021 Pap smear: Hysterectomy Mammogram: 10/2021, no report in epic Bone density: Never Colon screening: 10/2019 Vision screening: annually Dentist: biannually  Diet: She does not eat meat. She consumes fruits and veggies. She tries to avoid fried foods. 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 pt states she received 9 pints of blood   Vaginal intraepithelial neoplasia III (VAIN III)     Current Outpatient Medications  Medication Sig Dispense Refill   allopurinol (ZYLOPRIM) 100 MG tablet Take 1 tablet by mouth daily. 90 tablet 0   ALPRAZolam (XANAX) 0.25 MG tablet Take 1 tablet by mouth daily as needed for anxiety 20 tablet 0   amLODipine (NORVASC) 5 MG tablet Take 1 tablet (5 mg total) by mouth daily. 90 tablet 0   atorvastatin (LIPITOR) 10 MG tablet Take 1 tablet  by mouth daily. 90 tablet 0   citalopram (CELEXA) 40 MG tablet TAKE 1 TABLET BY MOUTH DAILY 90 tablet 0   colchicine 0.6 MG tablet TAKE 1 TABLET BY MOUTH 2 TIMES DAILY FOR 7-10 DAYS AND THEN AS NEEDED FOR ATTACKS. 90 tablet 0   fluticasone (GNP FLUTICASONE PROPIONATE) 50 MCG/ACT nasal spray Place 2 sprays in each nostril daily as directed 16 g 12   hydrochlorothiazide (HYDRODIURIL) 25 MG tablet TAKE 1  TABLET BY MOUTH ONCE DAILY 90 tablet 0   Multiple Vitamin (MULTIVITAMIN) tablet Take 1 tablet by mouth daily.     nystatin-triamcinolone ointment (MYCOLOG) APPLY TO THE AFFECTED AREA(S) TOPICALLY 2 TIMES PER DAY AS NEEDED 30 g 2   traZODone (DESYREL) 50 MG tablet Take 1 tablet by mouth at bedtime. 90 tablet 0   No current facility-administered medications for this visit.    Allergies  Allergen Reactions   Doxycycline Other (See Comments)    Oral blisters   Penicillins Rash    - has tolerated cephalosporins    Family History  Problem Relation Age of Onset   Hypertension Mother    Diabetes Mother    Breast cancer Mother    Alcohol abuse Father    Hypertension Father    Stroke Father    Colon cancer Father 93   Heart disease Brother 40       MI   Colon polyps Brother    Colon polyps Sister    Colon polyps Sister    Colon polyps Brother    Esophageal cancer Neg Hx    Rectal cancer Neg Hx    Stomach cancer Neg Hx     Social History   Socioeconomic History   Marital status: Single  Spouse name: Not on file   Number of children: Not on file   Years of education: Not on file   Highest education level: Not on file  Occupational History   Not on file  Tobacco Use   Smoking status: Never   Smokeless tobacco: Never  Vaping Use   Vaping Use: Never used  Substance and Sexual Activity   Alcohol use: Yes    Comment: rarely   Drug use: No   Sexual activity: Not on file  Other Topics Concern   Not on file  Social History Narrative   Married, twin daughters born 61, son born 30   Works at [employer] in Engineer, mining.   Social Determinants of Health   Financial Resource Strain: Not on file  Food Insecurity: Not on file  Transportation Needs: Not on file  Physical Activity: Not on file  Stress: Not on file  Social Connections: Not on file  Intimate Partner Violence: Not on file     Constitutional: Denies fever, malaise, fatigue, headache or abrupt weight changes.   HEENT: Denies eye pain, eye redness, ear pain, ringing in the ears, wax buildup, runny nose, nasal congestion, bloody nose, or sore throat. Respiratory: Denies difficulty breathing, shortness of breath, cough or sputum production.   Cardiovascular: Denies chest pain, chest tightness, palpitations or swelling in the hands or feet.  Gastrointestinal: Denies abdominal pain, bloating, constipation, diarrhea or blood in the stool.  GU: Denies urgency, frequency, pain with urination, burning sensation, blood in urine, odor or discharge. Musculoskeletal: Patient reports intermittent joint pain.  Denies decrease in range of motion, difficulty with gait, muscle pain or joint swelling.  Skin: Patient reports lesion of nose.  Denies redness, rashes, ulcercations.  Neurological: Patient reports insomnia.  Denies dizziness, difficulty with memory, difficulty with speech or problems with balance and coordination.  Psych: Patient has a history of anxiety and depression.  Denies SI/HI.  No other specific complaints in a complete review of systems (except as listed in HPI above).  Objective:   Physical Exam   BP (!) 136/92 (BP Location: Right Arm, Patient Position: Sitting, Cuff Size: Normal)   Pulse (!) 58   Temp (!) 96.8 F (36 C) (Temporal)   Ht 5' 7"  (1.702 m)   Wt 193 lb (87.5 kg)   SpO2 99%   BMI 30.23 kg/m   Wt Readings from Last 3 Encounters:  08/08/21 195 lb 12.8 oz (88.8 kg)  05/15/21 202 lb 3.2 oz (91.7 kg)  02/22/21 204 lb (92.5 kg)    General: Appears her stated age, obese, in NAD. Skin: Warm, dry and intact.  Papule noted of midline nasal bridge. HEENT: Head: normal shape and size; Eyes: sclera white, no icterus, conjunctiva pink, PERRLA and EOMs intact;  Neck:  Neck supple, trachea midline. No masses, lumps or thyromegaly present.  Cardiovascular: Bradycardic with normal rhythm. S1,S2 noted.  No murmur, rubs or gallops noted. No JVD or BLE edema. No carotid bruits  noted. Pulmonary/Chest: Normal effort and positive vesicular breath sounds. No respiratory distress. No wheezes, rales or ronchi noted.  Abdomen: Soft and nontender. Normal bowel sounds.  Musculoskeletal: Strength 5/5 BUE/BLE.  No difficulty with gait.  Neurological: Alert and oriented. Cranial nerves II-XII grossly intact. Coordination normal.  Psychiatric: Mood and affect normal. Behavior is normal. Judgment and thought content normal.   BMET    Component Value Date/Time   NA 143 05/15/2021 0822   K 3.7 05/15/2021 0822   CL 103 05/15/2021 4492  CO2 29 05/15/2021 0822   GLUCOSE 91 05/15/2021 0822   BUN 14 05/15/2021 0822   CREATININE 0.87 05/15/2021 0822   CALCIUM 9.7 05/15/2021 0822   GFRNONAA 73 05/15/2021 0822   GFRAA 85 05/15/2021 0822    Lipid Panel     Component Value Date/Time   CHOL 218 (H) 05/15/2021 0822   TRIG 197 (H) 05/15/2021 0822   HDL 57 05/15/2021 0822   CHOLHDL 3.8 05/15/2021 0822   VLDL 38.4 09/20/2019 1004   LDLCALC 127 (H) 05/15/2021 0822    CBC    Component Value Date/Time   WBC 7.1 05/15/2021 0822   RBC 5.26 (H) 05/15/2021 0822   HGB 15.1 05/15/2021 0822   HCT 46.2 (H) 05/15/2021 0822   PLT 283 05/15/2021 0822   MCV 87.8 05/15/2021 0822   MCH 28.7 05/15/2021 0822   MCHC 32.7 05/15/2021 0822   RDW 13.6 05/15/2021 0822   LYMPHSABS 1.9 09/20/2019 1004   MONOABS 0.5 09/20/2019 1004   EOSABS 0.2 09/20/2019 1004   BASOSABS 0.0 09/20/2019 1004    Hgb A1C Lab Results  Component Value Date   HGBA1C 5.8 (H) 05/15/2021           Assessment & Plan:   Preventative Health Maintenance:  Encouraged her to get a flu shot in the fall Tetanus UTD Encouraged her to get her COVID booster Shingrix UTD She no longer needs Pap smears Mammogram UTD, will request copy We will have her schedule her bone density with her mammogram next year through her GYN Colon screening UTD Encouraged her to consume a balanced diet and exercise regimen Advised  her to see an eye doctor and dentist annually We will check CBC, c-Met, lipid, A1c today  RTC in 6 months, follow-up chronic conditions [name], NP

## 2022-05-28 NOTE — Assessment & Plan Note (Signed)
Elevated today because she stopped taking her Amlodipine Continue HCTZ and restart Amlodipine Reinforced DASH diet and exercise for weight loss

## 2022-05-28 NOTE — Patient Instructions (Signed)
Health Maintenance for Postmenopausal Women Menopause is a normal process in which your ability to get pregnant comes to an end. This process happens slowly over many months or years, usually between the ages of 48 and 55. Menopause is complete when you have missed your menstrual period for 12 months. It is important to talk with your health care provider about some of the most common conditions that affect women after menopause (postmenopausal women). These include heart disease, cancer, and bone loss (osteoporosis). Adopting a healthy lifestyle and getting preventive care can help to promote your health and wellness. The actions you take can also lower your chances of developing some of these common conditions. What are the signs and symptoms of menopause? During menopause, you may have the following symptoms: Hot flashes. These can be moderate or severe. Night sweats. Decrease in sex drive. Mood swings. Headaches. Tiredness (fatigue). Irritability. Memory problems. Problems falling asleep or staying asleep. Talk with your health care provider about treatment options for your symptoms. Do I need hormone replacement therapy? Hormone replacement therapy is effective in treating symptoms that are caused by menopause, such as hot flashes and night sweats. Hormone replacement carries certain risks, especially as you become older. If you are thinking about using estrogen or estrogen with progestin, discuss the benefits and risks with your health care provider. How can I reduce my risk for heart disease and stroke? The risk of heart disease, heart attack, and stroke increases as you age. One of the causes may be a change in the body's hormones during menopause. This can affect how your body uses dietary fats, triglycerides, and cholesterol. Heart attack and stroke are medical emergencies. There are many things that you can do to help prevent heart disease and stroke. Watch your blood pressure High  blood pressure causes heart disease and increases the risk of stroke. This is more likely to develop in people who have high blood pressure readings or are overweight. Have your blood pressure checked: Every 3-5 years if you are 18-39 years of age. Every year if you are 40 years old or older. Eat a healthy diet  Eat a diet that includes plenty of vegetables, fruits, low-fat dairy products, and lean protein. Do not eat a lot of foods that are high in solid fats, added sugars, or sodium. Get regular exercise Get regular exercise. This is one of the most important things you can do for your health. Most adults should: Try to exercise for at least 150 minutes each week. The exercise should increase your heart rate and make you sweat (moderate-intensity exercise). Try to do strengthening exercises at least twice each week. Do these in addition to the moderate-intensity exercise. Spend less time sitting. Even light physical activity can be beneficial. Other tips Work with your health care provider to achieve or maintain a healthy weight. Do not use any products that contain nicotine or tobacco. These products include cigarettes, chewing tobacco, and vaping devices, such as e-cigarettes. If you need help quitting, ask your health care provider. Know your numbers. Ask your health care provider to check your cholesterol and your blood sugar (glucose). Continue to have your blood tested as directed by your health care provider. Do I need screening for cancer? Depending on your health history and family history, you may need to have cancer screenings at different stages of your life. This may include screening for: Breast cancer. Cervical cancer. Lung cancer. Colorectal cancer. What is my risk for osteoporosis? After menopause, you may be   at increased risk for osteoporosis. Osteoporosis is a condition in which bone destruction happens more quickly than new bone creation. To help prevent osteoporosis or  the bone fractures that can happen because of osteoporosis, you may take the following actions: If you are 19-50 years old, get at least 1,000 mg of calcium and at least 600 international units (IU) of vitamin D per day. If you are older than age 50 but younger than age 70, get at least 1,200 mg of calcium and at least 600 international units (IU) of vitamin D per day. If you are older than age 70, get at least 1,200 mg of calcium and at least 800 international units (IU) of vitamin D per day. Smoking and drinking excessive alcohol increase the risk of osteoporosis. Eat foods that are rich in calcium and vitamin D, and do weight-bearing exercises several times each week as directed by your health care provider. How does menopause affect my mental health? Depression may occur at any age, but it is more common as you become older. Common symptoms of depression include: Feeling depressed. Changes in sleep patterns. Changes in appetite or eating patterns. Feeling an overall lack of motivation or enjoyment of activities that you previously enjoyed. Frequent crying spells. Talk with your health care provider if you think that you are experiencing any of these symptoms. General instructions See your health care provider for regular wellness exams and vaccines. This may include: Scheduling regular health, dental, and eye exams. Getting and maintaining your vaccines. These include: Influenza vaccine. Get this vaccine each year before the flu season begins. Pneumonia vaccine. Shingles vaccine. Tetanus, diphtheria, and pertussis (Tdap) booster vaccine. Your health care provider may also recommend other immunizations. Tell your health care provider if you have ever been abused or do not feel safe at home. Summary Menopause is a normal process in which your ability to get pregnant comes to an end. This condition causes hot flashes, night sweats, decreased interest in sex, mood swings, headaches, or lack  of sleep. Treatment for this condition may include hormone replacement therapy. Take actions to keep yourself healthy, including exercising regularly, eating a healthy diet, watching your weight, and checking your blood pressure and blood sugar levels. Get screened for cancer and depression. Make sure that you are up to date with all your vaccines. This information is not intended to replace advice given to you by your health care provider. Make sure you discuss any questions you have with your health care provider. Document Revised: 04/23/2021 Document Reviewed: 04/23/2021 Elsevier Patient Education  2023 Elsevier Inc.  

## 2022-05-29 ENCOUNTER — Other Ambulatory Visit (HOSPITAL_COMMUNITY): Payer: Self-pay

## 2022-05-29 LAB — CBC
HCT: 45.4 % — ABNORMAL HIGH (ref 35.0–45.0)
Hemoglobin: 14.9 g/dL (ref 11.7–15.5)
MCH: 28.3 pg (ref 27.0–33.0)
MCHC: 32.8 g/dL (ref 32.0–36.0)
MCV: 86.1 fL (ref 80.0–100.0)
MPV: 10.2 fL (ref 7.5–12.5)
Platelets: 317 10*3/uL (ref 140–400)
RBC: 5.27 10*6/uL — ABNORMAL HIGH (ref 3.80–5.10)
RDW: 13.8 % (ref 11.0–15.0)
WBC: 7.3 10*3/uL (ref 3.8–10.8)

## 2022-05-29 LAB — LIPID PANEL
Cholesterol: 277 mg/dL — ABNORMAL HIGH (ref <200)
HDL: 47 mg/dL — ABNORMAL LOW (ref >=50)
LDL Cholesterol (Calc): 186 mg/dL (calc) — ABNORMAL HIGH
Non-HDL Cholesterol (Calc): 230 mg/dL (calc) — ABNORMAL HIGH (ref <130)
Total CHOL/HDL Ratio: 5.9 (calc) — ABNORMAL HIGH (ref <5.0)
Triglycerides: 239 mg/dL — ABNORMAL HIGH (ref <150)

## 2022-05-29 LAB — COMPLETE METABOLIC PANEL WITH GFR
AG Ratio: 1.5 (calc) (ref 1.0–2.5)
ALT: 29 U/L (ref 6–29)
AST: 23 U/L (ref 10–35)
Albumin: 4.5 g/dL (ref 3.6–5.1)
Alkaline phosphatase (APISO): 110 U/L (ref 37–153)
BUN: 11 mg/dL (ref 7–25)
CO2: 29 mmol/L (ref 20–32)
Calcium: 9.8 mg/dL (ref 8.6–10.4)
Chloride: 99 mmol/L (ref 98–110)
Creat: 0.89 mg/dL (ref 0.50–1.05)
Globulin: 3 g/dL (calc) (ref 1.9–3.7)
Glucose, Bld: 93 mg/dL (ref 65–99)
Potassium: 3.4 mmol/L — ABNORMAL LOW (ref 3.5–5.3)
Sodium: 139 mmol/L (ref 135–146)
Total Bilirubin: 0.6 mg/dL (ref 0.2–1.2)
Total Protein: 7.5 g/dL (ref 6.1–8.1)
eGFR: 74 mL/min/{1.73_m2} (ref >=60)

## 2022-05-29 LAB — HEMOGLOBIN A1C
Hgb A1c MFr Bld: 5.7 % of total Hgb — ABNORMAL HIGH (ref <5.7)
Mean Plasma Glucose: 117 mg/dL
eAG (mmol/L): 6.5 mmol/L

## 2022-05-29 MED ORDER — ALPRAZOLAM 0.25 MG PO TABS
0.2500 mg | ORAL_TABLET | Freq: Every day | ORAL | 0 refills | Status: DC | PRN
  Filled 2022-05-29: qty 20, 20d supply, fill #0

## 2022-05-29 MED ORDER — COLCHICINE 0.6 MG PO TABS
ORAL_TABLET | ORAL | 0 refills | Status: DC
  Filled 2022-05-29: qty 90, 90d supply, fill #0

## 2022-05-29 NOTE — Addendum Note (Signed)
Addended by: Jearld Fenton on: 05/29/2022 09:03 AM   Modules accepted: Orders

## 2022-05-29 NOTE — Telephone Encounter (Signed)
Requested medication (s) are due for refill today:   Provider to review for both  Requested medication (s) are on the active medication list:   Yes for both  Future visit scheduled:   No   Seen yesterday 6/13   Last ordered: Xanax 02/19/2022 #20, 0 refills;   Colchicine 02/19/2022 #90, 0  reported on 6/13 that she is not taking this  Returned because it's a non delegated refill for Xanax and Colchicine is not being taken by pt as of yesterday.     Requested Prescriptions  Pending Prescriptions Disp Refills   ALPRAZolam (XANAX) 0.25 MG tablet 20 tablet 0    Sig: Take 1 tablet by mouth daily as needed for anxiety     Not Delegated - Psychiatry: Anxiolytics/Hypnotics 2 Failed - 05/28/2022  1:07 PM      Failed - This refill cannot be delegated      Failed - Urine Drug Screen completed in last 360 days      Passed - Patient is not pregnant      Passed - Valid encounter within last 6 months    Recent Outpatient Visits           Yesterday Encounter for general adult medical examination with abnormal findings   [facility] [place], Coralie Keens, NP   2 months ago Primary hypertension   [facility] [place], [place] W, NP   9 months ago Class 1 obesity due to excess calories with serious comorbidity and body mass index (BMI) of 31.0 to 31.9 in adult   [place], Coralie Keens, NP   1 year ago Encounter for general adult medical examination with abnormal findings   [facility] [place], Coralie Keens, NP               colchicine 0.6 MG tablet 90 tablet 0    Sig: TAKE 1 TABLET BY MOUTH 2 TIMES DAILY FOR 7-10 DAYS AND THEN AS NEEDED FOR ATTACKS.     Endocrinology:  Gout Agents - colchicine Failed - 05/28/2022  1:07 PM      Failed - Cr in normal range and within 360 days    Creat  Date Value Ref Range Status  05/28/2022 0.89 0.50 - 1.05 mg/dL Final         Failed - ALT in normal range and within 360 days    ALT  Date Value Ref  Range Status  05/28/2022 29 6 - 29 U/L Final         Failed - AST in normal range and within 360 days    AST  Date Value Ref Range Status  05/28/2022 23 10 - 35 U/L Final         Failed - CBC within normal limits and completed in the last 12 months    WBC  Date Value Ref Range Status  05/28/2022 7.3 3.8 - 10.8 Thousand/uL Final   RBC  Date Value Ref Range Status  05/28/2022 5.27 (H) 3.80 - 5.10 Million/uL Final   Hemoglobin  Date Value Ref Range Status  05/28/2022 14.9 11.7 - 15.5 g/dL Final   HCT  Date Value Ref Range Status  05/28/2022 45.4 (H) 35.0 - 45.0 % Final   MCHC  Date Value Ref Range Status  05/28/2022 32.8 32.0 - 36.0 g/dL Final   [facility]  Date Value Ref Range Status  05/28/2022 28.3 27.0 - 33.0 pg Final   MCV  Date Value Ref  Range Status  05/28/2022 86.1 80.0 - 100.0 fL Final   No results found for: "PLTCOUNTKUC", "LABPLAT", "POCPLA" RDW  Date Value Ref Range Status  05/28/2022 13.8 11.0 - 15.0 % Final         Passed - Valid encounter within last 12 months    Recent Outpatient Visits           Yesterday Encounter for general adult medical examination with abnormal findings   [facility] [place], Coralie Keens, NP   2 months ago Primary hypertension   [facility] [place], Coralie Keens, NP   9 months ago Class 1 obesity due to excess calories with serious comorbidity and body mass index (BMI) of 31.0 to 31.9 in adult   [place], Coralie Keens, NP   1 year ago Encounter for general adult medical examination with abnormal findings   [facility] [place], Coralie Keens, NP

## 2022-05-30 ENCOUNTER — Other Ambulatory Visit (HOSPITAL_COMMUNITY): Payer: Self-pay

## 2022-05-30 MED ORDER — ROSUVASTATIN CALCIUM 5 MG PO TABS
5.0000 mg | ORAL_TABLET | Freq: Every day | ORAL | 2 refills | Status: DC
  Filled 2022-05-30: qty 30, 30d supply, fill #0
  Filled 2022-06-21: qty 30, 30d supply, fill #1
  Filled 2022-07-19: qty 30, 30d supply, fill #2

## 2022-05-30 NOTE — Addendum Note (Signed)
Addended by: Jearld Fenton on: 05/30/2022 06:54 AM   Modules accepted: Orders

## 2022-06-03 ENCOUNTER — Encounter: Payer: Self-pay | Admitting: Internal Medicine

## 2022-06-21 ENCOUNTER — Other Ambulatory Visit (HOSPITAL_COMMUNITY): Payer: Self-pay

## 2022-06-21 ENCOUNTER — Other Ambulatory Visit: Payer: Self-pay | Admitting: Internal Medicine

## 2022-06-21 DIAGNOSIS — I10 Essential (primary) hypertension: Secondary | ICD-10-CM

## 2022-06-21 MED ORDER — TRAZODONE HCL 50 MG PO TABS
50.0000 mg | ORAL_TABLET | Freq: Every evening | ORAL | 0 refills | Status: DC
  Filled 2022-06-21: qty 90, 90d supply, fill #0

## 2022-06-21 MED ORDER — HYDROCHLOROTHIAZIDE 25 MG PO TABS
ORAL_TABLET | Freq: Every day | ORAL | 0 refills | Status: DC
  Filled 2022-06-21: qty 90, 90d supply, fill #0

## 2022-06-21 MED ORDER — AMLODIPINE BESYLATE 5 MG PO TABS
5.0000 mg | ORAL_TABLET | Freq: Every day | ORAL | 0 refills | Status: DC
  Filled 2022-06-21: qty 90, 90d supply, fill #0

## 2022-06-21 NOTE — Telephone Encounter (Signed)
Requested Prescriptions  Pending Prescriptions Disp Refills  . amLODipine (NORVASC) 5 MG tablet 90 tablet 0    Sig: Take 1 tablet (5 mg total) by mouth daily.     Cardiovascular: Calcium Channel Blockers 2 Failed - 06/21/2022  1:43 PM      Failed - Last BP in normal range    BP Readings from Last 1 Encounters:  05/28/22 (!) 136/92         Passed - Last Heart Rate in normal range    Pulse Readings from Last 1 Encounters:  05/28/22 (!) 58         Passed - Valid encounter within last 6 months    Recent Outpatient Visits          3 weeks ago Encounter for general adult medical examination with abnormal findings   [facility] [place], Coralie Keens, NP   2 months ago Primary hypertension   [facility] [place], Coralie Keens, NP   10 months ago Class 1 obesity due to excess calories with serious comorbidity and body mass index (BMI) of 31.0 to 31.9 in adult   [place], [place] W, NP   1 year ago Encounter for general adult medical examination with abnormal findings   [facility] [place], Coralie Keens, NP             . hydrochlorothiazide (HYDRODIURIL) 25 MG tablet 90 tablet 0    Sig: TAKE 1 TABLET BY MOUTH ONCE DAILY     Cardiovascular: Diuretics - Thiazide Failed - 06/21/2022  1:43 PM      Failed - K in normal range and within 180 days    Potassium  Date Value Ref Range Status  05/28/2022 3.4 (L) 3.5 - 5.3 mmol/L Final         Failed - Last BP in normal range    BP Readings from Last 1 Encounters:  05/28/22 (!) 136/92         Passed - Cr in normal range and within 180 days    Creat  Date Value Ref Range Status  05/28/2022 0.89 0.50 - 1.05 mg/dL Final         Passed - Na in normal range and within 180 days    Sodium  Date Value Ref Range Status  05/28/2022 139 135 - 146 mmol/L Final         Passed - Valid encounter within last 6 months    Recent Outpatient Visits          3 weeks ago Encounter for general adult  medical examination with abnormal findings   [facility] [place], Coralie Keens, NP   2 months ago Primary hypertension   [facility] [place], [place] W, NP   10 months ago Class 1 obesity due to excess calories with serious comorbidity and body mass index (BMI) of 31.0 to 31.9 in adult   [place], Coralie Keens, NP   1 year ago Encounter for general adult medical examination with abnormal findings   [facility] [place], Coralie Keens, NP             . traZODone (DESYREL) 50 MG tablet 90 tablet 0    Sig: Take 1 tablet by mouth at bedtime.     Psychiatry: Antidepressants - Serotonin Modulator Passed - 06/21/2022  1:43 PM      Passed - Completed PHQ-2 or PHQ-9 in the  last 360 days      Passed - Valid encounter within last 6 months    Recent Outpatient Visits          3 weeks ago Encounter for general adult medical examination with abnormal findings   [facility] [place], Coralie Keens, NP   2 months ago Primary hypertension   [facility] [place], [place], NP   10 months ago Class 1 obesity due to excess calories with serious comorbidity and body mass index (BMI) of 31.0 to 31.9 in adult   [place], Coralie Keens, NP   1 year ago Encounter for general adult medical examination with abnormal findings   [facility] [place], Coralie Keens, NP

## 2022-06-24 ENCOUNTER — Other Ambulatory Visit (HOSPITAL_COMMUNITY): Payer: Self-pay

## 2022-07-16 ENCOUNTER — Other Ambulatory Visit: Payer: Self-pay | Admitting: Internal Medicine

## 2022-07-17 ENCOUNTER — Other Ambulatory Visit (HOSPITAL_COMMUNITY): Payer: Self-pay

## 2022-07-17 MED ORDER — CITALOPRAM HYDROBROMIDE 40 MG PO TABS
ORAL_TABLET | Freq: Every day | ORAL | 0 refills | Status: DC
  Filled 2022-07-17: qty 90, 90d supply, fill #0

## 2022-07-17 NOTE — Telephone Encounter (Signed)
Requested Prescriptions  Pending Prescriptions Disp Refills  . citalopram (CELEXA) 40 MG tablet 90 tablet 0    Sig: TAKE 1 TABLET BY MOUTH DAILY     Psychiatry:  Antidepressants - SSRI Passed - 07/16/2022 12:11 PM      Passed - Completed PHQ-2 or PHQ-9 in the last 360 days      Passed - Valid encounter within last 6 months    Recent Outpatient Visits          1 month ago Encounter for general adult medical examination with abnormal findings   [facility] [place], Coralie Keens, NP   3 months ago Primary hypertension   [facility] [place], [place] W, NP   11 months ago Class 1 obesity due to excess calories with serious comorbidity and body mass index (BMI) of 31.0 to 31.9 in adult   [place], Coralie Keens, NP   1 year ago Encounter for general adult medical examination with abnormal findings   [facility] [place], Coralie Keens, NP

## 2022-07-19 ENCOUNTER — Other Ambulatory Visit (HOSPITAL_COMMUNITY): Payer: Self-pay

## 2022-08-13 ENCOUNTER — Other Ambulatory Visit: Payer: Self-pay | Admitting: Internal Medicine

## 2022-08-13 ENCOUNTER — Encounter: Payer: Self-pay | Admitting: Internal Medicine

## 2022-08-13 ENCOUNTER — Other Ambulatory Visit (HOSPITAL_COMMUNITY): Payer: Self-pay

## 2022-08-13 MED ORDER — ROSUVASTATIN CALCIUM 5 MG PO TABS
5.0000 mg | ORAL_TABLET | Freq: Every day | ORAL | 0 refills | Status: DC
  Filled 2022-08-13: qty 90, 90d supply, fill #0

## 2022-08-13 NOTE — Telephone Encounter (Signed)
Requested medications are due for refill today.  yes  Requested medications are on the active medications list.  yes  Last refill. 03/25/2022 #90 0 refills  Future visit scheduled.   no  Notes to clinic.  Labs are expired.    Requested Prescriptions  Pending Prescriptions Disp Refills   allopurinol (ZYLOPRIM) 100 MG tablet 90 tablet 0    Sig: Take 1 tablet by mouth daily.     Endocrinology:  Gout Agents - allopurinol Failed - 08/13/2022  1:39 PM      Failed - Uric Acid in normal range and within 360 days    Uric Acid, Serum  Date Value Ref Range Status  05/15/2021 8.7 (H) 2.5 - 7.0 mg/dL Final    Comment:    Therapeutic target for gout patients: <6.0 mg/dL .          Failed - CBC within normal limits and completed in the last 12 months    WBC  Date Value Ref Range Status  05/28/2022 7.3 3.8 - 10.8 Thousand/uL Final   RBC  Date Value Ref Range Status  05/28/2022 5.27 (H) 3.80 - 5.10 Million/uL Final   Hemoglobin  Date Value Ref Range Status  05/28/2022 14.9 11.7 - 15.5 g/dL Final   HCT  Date Value Ref Range Status  05/28/2022 45.4 (H) 35.0 - 45.0 % Final   MCHC  Date Value Ref Range Status  05/28/2022 32.8 32.0 - 36.0 g/dL Final   [facility]  Date Value Ref Range Status  05/28/2022 28.3 27.0 - 33.0 pg Final   MCV  Date Value Ref Range Status  05/28/2022 86.1 80.0 - 100.0 fL Final   No results found for: "PLTCOUNTKUC", "LABPLAT", "POCPLA" RDW  Date Value Ref Range Status  05/28/2022 13.8 11.0 - 15.0 % Final         Passed - Cr in normal range and within 360 days    Creat  Date Value Ref Range Status  05/28/2022 0.89 0.50 - 1.05 mg/dL Final         Passed - Valid encounter within last 12 months    Recent Outpatient Visits           2 months ago Encounter for general adult medical examination with abnormal findings   [facility] [place], Coralie Keens, NP   4 months ago Primary hypertension   [facility] [place], [place] W, NP    1 year ago Class 1 obesity due to excess calories with serious comorbidity and body mass index (BMI) of 31.0 to 31.9 in adult   [place], Coralie Keens, NP   1 year ago Encounter for general adult medical examination with abnormal findings   [facility] [place], Coralie Keens, NP

## 2022-08-14 ENCOUNTER — Other Ambulatory Visit (HOSPITAL_COMMUNITY): Payer: Self-pay

## 2022-08-14 MED ORDER — ALLOPURINOL 100 MG PO TABS
100.0000 mg | ORAL_TABLET | Freq: Every day | ORAL | 0 refills | Status: DC
  Filled 2022-08-14: qty 90, 90d supply, fill #0

## 2022-08-16 ENCOUNTER — Other Ambulatory Visit: Payer: Self-pay | Admitting: Internal Medicine

## 2022-08-16 ENCOUNTER — Other Ambulatory Visit (HOSPITAL_COMMUNITY): Payer: Self-pay

## 2022-08-16 DIAGNOSIS — F32A Depression, unspecified: Secondary | ICD-10-CM

## 2022-08-16 MED ORDER — ALPRAZOLAM 0.25 MG PO TABS
0.2500 mg | ORAL_TABLET | Freq: Every day | ORAL | 0 refills | Status: DC | PRN
  Filled 2022-08-16: qty 20, 20d supply, fill #0

## 2022-08-16 NOTE — Telephone Encounter (Signed)
Requested medication (s) are due for refill today - yes  Requested medication (s) are on the active medication list -yes  Future visit scheduled -no  Last refill: 05/29/22 #20  Notes to clinic: non delegated Rx  Requested Prescriptions  Pending Prescriptions Disp Refills   ALPRAZolam (XANAX) 0.25 MG tablet 20 tablet 0    Sig: Take 1 tablet by mouth daily as needed for anxiety     Not Delegated - Psychiatry: Anxiolytics/Hypnotics 2 Failed - 08/16/2022 10:15 AM      Failed - This refill cannot be delegated      Failed - Urine Drug Screen completed in last 360 days      Passed - Patient is not pregnant      Passed - Valid encounter within last 6 months    Recent Outpatient Visits           2 months ago Encounter for general adult medical examination with abnormal findings   [facility] [place], Coralie Keens, NP   4 months ago Primary hypertension   [facility] [place], [place] W, NP   1 year ago Class 1 obesity due to excess calories with serious comorbidity and body mass index (BMI) of 31.0 to 31.9 in adult   [place], Coralie Keens, NP   1 year ago Encounter for general adult medical examination with abnormal findings   [facility] [place], Coralie Keens, NP                 Requested Prescriptions  Pending Prescriptions Disp Refills   ALPRAZolam (XANAX) 0.25 MG tablet 20 tablet 0    Sig: Take 1 tablet by mouth daily as needed for anxiety     Not Delegated - Psychiatry: Anxiolytics/Hypnotics 2 Failed - 08/16/2022 10:15 AM      Failed - This refill cannot be delegated      Failed - Urine Drug Screen completed in last 360 days      Passed - Patient is not pregnant      Passed - Valid encounter within last 6 months    Recent Outpatient Visits           2 months ago Encounter for general adult medical examination with abnormal findings   [facility] [place], Coralie Keens, NP   4 months ago Primary  hypertension   [facility] [place], [place] W, NP   1 year ago Class 1 obesity due to excess calories with serious comorbidity and body mass index (BMI) of 31.0 to 31.9 in adult   [place], Coralie Keens, NP   1 year ago Encounter for general adult medical examination with abnormal findings   [facility] [place], Coralie Keens, NP

## 2022-08-20 ENCOUNTER — Other Ambulatory Visit: Payer: Self-pay | Admitting: Internal Medicine

## 2022-08-20 DIAGNOSIS — M109 Gout, unspecified: Secondary | ICD-10-CM

## 2022-08-21 ENCOUNTER — Other Ambulatory Visit (HOSPITAL_COMMUNITY): Payer: Self-pay

## 2022-08-21 MED ORDER — COLCHICINE 0.6 MG PO TABS
ORAL_TABLET | ORAL | 1 refills | Status: DC
  Filled 2022-08-21: qty 90, 45d supply, fill #0

## 2022-08-21 NOTE — Telephone Encounter (Signed)
Requested Prescriptions  Pending Prescriptions Disp Refills  . colchicine 0.6 MG tablet 90 tablet 1    Sig: TAKE 1 TABLET BY MOUTH 2 TIMES DAILY FOR 7-10 DAYS AND THEN AS NEEDED FOR ATTACKS.     Endocrinology:  Gout Agents - colchicine Failed - 08/20/2022 12:44 PM      Failed - CBC within normal limits and completed in the last 12 months    WBC  Date Value Ref Range Status  05/28/2022 7.3 3.8 - 10.8 Thousand/uL Final   RBC  Date Value Ref Range Status  05/28/2022 5.27 (H) 3.80 - 5.10 Million/uL Final   Hemoglobin  Date Value Ref Range Status  05/28/2022 14.9 11.7 - 15.5 g/dL Final   HCT  Date Value Ref Range Status  05/28/2022 45.4 (H) 35.0 - 45.0 % Final   MCHC  Date Value Ref Range Status  05/28/2022 32.8 32.0 - 36.0 g/dL Final   [facility]  Date Value Ref Range Status  05/28/2022 28.3 27.0 - 33.0 pg Final   MCV  Date Value Ref Range Status  05/28/2022 86.1 80.0 - 100.0 fL Final   No results found for: "PLTCOUNTKUC", "LABPLAT", "POCPLA" RDW  Date Value Ref Range Status  05/28/2022 13.8 11.0 - 15.0 % Final         Passed - Cr in normal range and within 360 days    Creat  Date Value Ref Range Status  05/28/2022 0.89 0.50 - 1.05 mg/dL Final         Passed - ALT in normal range and within 360 days    ALT  Date Value Ref Range Status  05/28/2022 29 6 - 29 U/L Final         Passed - AST in normal range and within 360 days    AST  Date Value Ref Range Status  05/28/2022 23 10 - 35 U/L Final         Passed - Valid encounter within last 12 months    Recent Outpatient Visits          2 months ago Encounter for general adult medical examination with abnormal findings   [facility] [place], Coralie Keens, NP   4 months ago Primary hypertension   [facility] [place], Coralie Keens, NP   1 year ago Class 1 obesity due to excess calories with serious comorbidity and body mass index (BMI) of 31.0 to 31.9 in adult   [place],  Coralie Keens, NP   1 year ago Encounter for general adult medical examination with abnormal findings   [facility] [place], Coralie Keens, NP

## 2022-09-02 ENCOUNTER — Other Ambulatory Visit: Payer: Self-pay

## 2022-09-02 DIAGNOSIS — E782 Mixed hyperlipidemia: Secondary | ICD-10-CM

## 2022-09-03 ENCOUNTER — Other Ambulatory Visit: Payer: No Typology Code available for payment source

## 2022-09-17 ENCOUNTER — Other Ambulatory Visit (HOSPITAL_COMMUNITY): Payer: Self-pay

## 2022-09-17 ENCOUNTER — Other Ambulatory Visit: Payer: Self-pay | Admitting: Internal Medicine

## 2022-09-17 DIAGNOSIS — F419 Anxiety disorder, unspecified: Secondary | ICD-10-CM

## 2022-09-17 MED ORDER — TRAZODONE HCL 50 MG PO TABS
50.0000 mg | ORAL_TABLET | Freq: Every evening | ORAL | 0 refills | Status: DC
  Filled 2022-09-17: qty 90, 90d supply, fill #0

## 2022-09-17 NOTE — Telephone Encounter (Signed)
Requested medication (s) are due for refill today: yes  Requested medication (s) are on the active medication list: yes  Last refill:  08/16/22 #20 with 0 RF  Future visit scheduled: tomorrow, seen 05/28/22  Notes to clinic:  This medication can not be delegated, please assess.        Requested Prescriptions  Pending Prescriptions Disp Refills   ALPRAZolam (XANAX) 0.25 MG tablet 20 tablet 0    Sig: Take 1 tablet by mouth daily as needed for anxiety     Not Delegated - Psychiatry: Anxiolytics/Hypnotics 2 Failed - 09/17/2022 12:08 PM      Failed - This refill cannot be delegated      Failed - Urine Drug Screen completed in last 360 days      Passed - Patient is not pregnant      Passed - Valid encounter within last 6 months    Recent Outpatient Visits           3 months ago Encounter for general adult medical examination with abnormal findings   [facility] [place], Coralie Keens, NP   5 months ago Primary hypertension   [facility] [place], [place] W, NP   1 year ago Class 1 obesity due to excess calories with serious comorbidity and body mass index (BMI) of 31.0 to 31.9 in adult   [place], Coralie Keens, NP   1 year ago Encounter for general adult medical examination with abnormal findings   [facility] [place], Coralie Keens, NP              Signed Prescriptions Disp Refills   traZODone (DESYREL) 50 MG tablet 90 tablet 0    Sig: Take 1 tablet by mouth at bedtime.     Psychiatry: Antidepressants - Serotonin Modulator Passed - 09/17/2022 12:08 PM      Passed - Completed PHQ-2 or PHQ-9 in the last 360 days      Passed - Valid encounter within last 6 months    Recent Outpatient Visits           3 months ago Encounter for general adult medical examination with abnormal findings   [facility] [place], Coralie Keens, NP   5 months ago Primary hypertension   [facility] [place], Coralie Keens, NP    1 year ago Class 1 obesity due to excess calories with serious comorbidity and body mass index (BMI) of 31.0 to 31.9 in adult   [place], Coralie Keens, NP   1 year ago Encounter for general adult medical examination with abnormal findings   [facility] [place], Coralie Keens, NP

## 2022-09-17 NOTE — Telephone Encounter (Signed)
Requested Prescriptions  Pending Prescriptions Disp Refills  . traZODone (DESYREL) 50 MG tablet 90 tablet 0    Sig: Take 1 tablet by mouth at bedtime.     Psychiatry: Antidepressants - Serotonin Modulator Passed - 09/17/2022 12:08 PM      Passed - Completed PHQ-2 or PHQ-9 in the last 360 days      Passed - Valid encounter within last 6 months    Recent Outpatient Visits          3 months ago Encounter for general adult medical examination with abnormal findings   [facility] [place], Coralie Keens, NP   5 months ago Primary hypertension   [facility] [place], Coralie Keens, NP   1 year ago Class 1 obesity due to excess calories with serious comorbidity and body mass index (BMI) of 31.0 to 31.9 in adult   [place], Coralie Keens, NP   1 year ago Encounter for general adult medical examination with abnormal findings   [facility] [place], Coralie Keens, NP             . ALPRAZolam Duanne Moron) 0.25 MG tablet 20 tablet 0    Sig: Take 1 tablet by mouth daily as needed for anxiety     Not Delegated - Psychiatry: Anxiolytics/Hypnotics 2 Failed - 09/17/2022 12:08 PM      Failed - This refill cannot be delegated      Failed - Urine Drug Screen completed in last 360 days      Passed - Patient is not pregnant      Passed - Valid encounter within last 6 months    Recent Outpatient Visits          3 months ago Encounter for general adult medical examination with abnormal findings   [facility] [place], Coralie Keens, NP   5 months ago Primary hypertension   [facility] [place], Coralie Keens, NP   1 year ago Class 1 obesity due to excess calories with serious comorbidity and body mass index (BMI) of 31.0 to 31.9 in adult   [place], Coralie Keens, NP   1 year ago Encounter for general adult medical examination with abnormal findings   [facility] [place], Coralie Keens, NP

## 2022-09-18 ENCOUNTER — Ambulatory Visit (INDEPENDENT_AMBULATORY_CARE_PROVIDER_SITE_OTHER): Payer: No Typology Code available for payment source

## 2022-09-18 ENCOUNTER — Other Ambulatory Visit (HOSPITAL_COMMUNITY): Payer: Self-pay

## 2022-09-18 DIAGNOSIS — Z23 Encounter for immunization: Secondary | ICD-10-CM | POA: Diagnosis not present

## 2022-09-18 MED ORDER — ALPRAZOLAM 0.25 MG PO TABS
0.2500 mg | ORAL_TABLET | Freq: Every day | ORAL | 0 refills | Status: DC | PRN
  Filled 2022-09-18: qty 20, 20d supply, fill #0

## 2022-10-06 ENCOUNTER — Other Ambulatory Visit: Payer: Self-pay | Admitting: Internal Medicine

## 2022-10-06 DIAGNOSIS — I10 Essential (primary) hypertension: Secondary | ICD-10-CM

## 2022-10-08 ENCOUNTER — Other Ambulatory Visit (HOSPITAL_COMMUNITY): Payer: Self-pay

## 2022-10-08 MED ORDER — HYDROCHLOROTHIAZIDE 25 MG PO TABS
25.0000 mg | ORAL_TABLET | Freq: Every day | ORAL | 0 refills | Status: DC
  Filled 2022-10-08: qty 90, 90d supply, fill #0

## 2022-10-08 NOTE — Telephone Encounter (Signed)
Requested Prescriptions  Pending Prescriptions Disp Refills  . hydrochlorothiazide (HYDRODIURIL) 25 MG tablet 90 tablet 0    Sig: TAKE 1 TABLET BY MOUTH ONCE DAILY     Cardiovascular: Diuretics - Thiazide Failed - 10/06/2022 10:22 AM      Failed - K in normal range and within 180 days    Potassium  Date Value Ref Range Status  05/28/2022 3.4 (L) 3.5 - 5.3 mmol/L Final         Failed - Last BP in normal range    BP Readings from Last 1 Encounters:  05/28/22 (!) 136/92         Passed - Cr in normal range and within 180 days    Creat  Date Value Ref Range Status  05/28/2022 0.89 0.50 - 1.05 mg/dL Final         Passed - Na in normal range and within 180 days    Sodium  Date Value Ref Range Status  05/28/2022 139 135 - 146 mmol/L Final         Passed - Valid encounter within last 6 months    Recent Outpatient Visits          4 months ago Encounter for general adult medical examination with abnormal findings   [facility] [place], Coralie Keens, NP   6 months ago Primary hypertension   [facility] [place], [place], NP   1 year ago Class 1 obesity due to excess calories with serious comorbidity and body mass index (BMI) of 31.0 to 31.9 in adult   [place], Coralie Keens, NP   1 year ago Encounter for general adult medical examination with abnormal findings   [facility] [place], Coralie Keens, NP

## 2022-10-14 ENCOUNTER — Encounter: Payer: Self-pay | Admitting: Internal Medicine

## 2022-10-14 ENCOUNTER — Telehealth (INDEPENDENT_AMBULATORY_CARE_PROVIDER_SITE_OTHER): Payer: No Typology Code available for payment source | Admitting: Internal Medicine

## 2022-10-14 DIAGNOSIS — J00 Acute nasopharyngitis [common cold]: Secondary | ICD-10-CM | POA: Diagnosis not present

## 2022-10-14 MED ORDER — PROMETHAZINE-DM 6.25-15 MG/5ML PO SYRP: 5.0000 mL | ORAL_SOLUTION | Freq: Four times a day (QID) | ORAL | 0 refills | Status: DC | PRN

## 2022-10-14 MED ORDER — AZITHROMYCIN 250 MG PO TABS: ORAL_TABLET | ORAL | 0 refills | Status: DC

## 2022-10-14 NOTE — Patient Instructions (Signed)

## 2022-10-14 NOTE — Progress Notes (Signed)
Virtual Visit via Video Note  I connected with Regina Huber on 10/14/22 at 11:20 AM EDT by a video enabled telemedicine application and verified that I am speaking with the correct person using two identifiers.  Location: Patient: Home Provider: Office  Persons participating in this video call: [name], NP and Mindi Slicker   I discussed the limitations of evaluation and management by telemedicine and the availability of in person appointments. The patient expressed understanding and agreed to proceed.  History of Present Illness:  Patient reports fatigue, chills, ear pain, sore throat and cough.  This started 3 days ago.  She is having some difficulty swallowing.  The cough is productive of blood-tinged green mucus.  She is unsure if she is running a fever but feels like she is having chills and body aches.  She denies headache, runny nose, nasal congestion, shortness of breath, nausea, vomiting or diarrhea.  She has tried NyQuil and DayQuil OTC with minimal relief of symptoms.  She has not had sick contacts that she is aware of.  She has not taken a home COVID test.   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 pt states she received 9 pints of blood   Vaginal intraepithelial neoplasia III (VAIN III)     Current Outpatient Medications  Medication Sig Dispense Refill   allopurinol (ZYLOPRIM) 100 MG tablet Take 1 tablet by mouth daily. 90 tablet 0   ALPRAZolam (XANAX) 0.25 MG tablet Take 1 tablet by mouth daily as needed for anxiety 20 tablet 0   amLODipine  (NORVASC) 5 MG tablet Take 1 tablet (5 mg total) by mouth daily. 90 tablet 0   citalopram (CELEXA) 40 MG tablet TAKE 1 TABLET BY MOUTH DAILY 90 tablet 0   colchicine 0.6 MG tablet TAKE 1 TABLET BY MOUTH 2 TIMES DAILY FOR 7 - 10 DAYS THEN TAKE AS DIRECTED AS NEEDED FOR ATTACKS. 90 tablet 1   hydrochlorothiazide (HYDRODIURIL) 25 MG tablet Take 1 tablet (25 mg total) by mouth daily. 90 tablet 0   Multiple Vitamin (MULTIVITAMIN) tablet Take 1 tablet by mouth daily.     rosuvastatin (CRESTOR) 5 MG tablet Take 1 tablet (5 mg total) by mouth daily. 90 tablet 0   traZODone (DESYREL) 50 MG tablet Take 1 tablet by mouth at bedtime. 90 tablet 0   No current facility-administered medications for this visit.    Allergies  Allergen Reactions   Doxycycline Other (See Comments)    Oral blisters   Penicillins Rash    - has tolerated cephalosporins    Family History  Problem Relation Age of Onset   Hypertension Mother    Diabetes Mother    Breast cancer Mother    Alcohol abuse Father    Hypertension Father    Stroke Father    Colon cancer Father 73   Heart disease Brother 35  MI   Colon polyps Brother    Colon polyps Sister    Colon polyps Sister    Colon polyps Brother    Esophageal cancer Neg Hx    Rectal cancer Neg Hx    Stomach cancer Neg Hx     Social History   Socioeconomic History   Marital status: Single    Spouse name: Not on file   Number of children: Not on file   Years of education: Not on file   Highest education level: Not on file  Occupational History   Not on file  Tobacco Use   Smoking status: Never   Smokeless tobacco: Never  Vaping Use   Vaping Use: Never used  Substance and Sexual Activity   Alcohol use: Yes    Comment: rarely   Drug use: No   Sexual activity: Not on file  Other Topics Concern   Not on file  Social History Narrative   Married, twin daughters born 17, son born 24   Works at [employer] in Engineer, mining.   Social Determinants of Health    Financial Resource Strain: Not on file  Food Insecurity: Not on file  Transportation Needs: Not on file  Physical Activity: Not on file  Stress: Not on file  Social Connections: Not on file  Intimate Partner Violence: Not on file     Constitutional: Patient reports fatigue and chills.  Denies fever, headache or abrupt weight changes.  HEENT: Patient reports ear pain, sore throat.  Denies eye pain, eye redness, ear pain, ringing in the ears, wax buildup, runny nose, nasal congestion, bloody nose. Respiratory: Patient reports cough.  Denies difficulty breathing, shortness of breath, cough.   Cardiovascular: Denies chest pain, chest tightness, palpitations or swelling in the hands or feet.  Gastrointestinal: Denies abdominal pain, bloating, constipation, diarrhea or blood in the stool.   No other specific complaints in a complete review of systems (except as listed in HPI above).    Observations/Objective:  There were no vitals taken for this visit. Wt Readings from Last 3 Encounters:  05/28/22 193 lb (87.5 kg)  08/08/21 195 lb 12.8 oz (88.8 kg)  05/15/21 202 lb 3.2 oz (91.7 kg)    General: Appears her stated age, appears unwell but in NAD. HEENT: Head: normal shape and size; Nose: No congestion noted; Throat/Mouth: Hoarseness noted Pulmonary/Chest: Normal effort. No respiratory distress. Neurological: Alert and oriented.   BMET    Component Value Date/Time   NA 139 05/28/2022 0824   K 3.4 (L) 05/28/2022 0824   CL 99 05/28/2022 0824   CO2 29 05/28/2022 0824   GLUCOSE 93 05/28/2022 0824   BUN 11 05/28/2022 0824   CREATININE 0.89 05/28/2022 0824   CALCIUM 9.8 05/28/2022 0824   GFRNONAA 73 05/15/2021 0822   GFRAA 85 05/15/2021 0822    Lipid Panel     Component Value Date/Time   CHOL 277 (H) 05/28/2022 0824   TRIG 239 (H) 05/28/2022 0824   HDL 47 (L) 05/28/2022 0824   CHOLHDL 5.9 (H) 05/28/2022 0824   VLDL 38.4 09/20/2019 1004   LDLCALC 186 (H) 05/28/2022 0824     CBC    Component Value Date/Time   WBC 7.3 05/28/2022 0824   RBC 5.27 (H) 05/28/2022 0824   HGB 14.9 05/28/2022 0824   HCT 45.4 (H) 05/28/2022 0824   PLT 317 05/28/2022 0824   MCV 86.1 05/28/2022 0824   MCH 28.3 05/28/2022 0824   MCHC 32.8 05/28/2022 0824   RDW 13.8  05/28/2022 0824   LYMPHSABS 1.9 09/20/2019 1004   MONOABS 0.5 09/20/2019 1004   EOSABS 0.2 09/20/2019 1004   BASOSABS 0.0 09/20/2019 1004    Hgb A1C Lab Results  Component Value Date   HGBA1C 5.7 (H) 05/28/2022        Assessment and Plan:  Upper Respiratory Infection:  Advised her to take home COVID test and let me know if this is positive Rx for Azithromycin 250 mg daily x5 days Rx for Promethazine DM cough syrup-sedation caution given Encourage rest and fluids  RTC in 2 months for follow-up of chronic conditions  Follow Up Instructions:    I discussed the assessment and treatment plan with the patient. The patient was provided an opportunity to ask questions and all were answered. The patient agreed with the plan and demonstrated an understanding of the instructions.   The patient was advised to call back or seek an in-person evaluation if the symptoms worsen or if the condition fails to improve as anticipated.    [name], NP

## 2022-10-21 ENCOUNTER — Encounter: Payer: Self-pay | Admitting: Internal Medicine

## 2022-10-21 ENCOUNTER — Ambulatory Visit (INDEPENDENT_AMBULATORY_CARE_PROVIDER_SITE_OTHER): Payer: No Typology Code available for payment source | Admitting: Internal Medicine

## 2022-10-21 VITALS — BP 134/84 | HR 80 | Temp 96.9°F | Wt 202.0 lb

## 2022-10-21 DIAGNOSIS — J988 Other specified respiratory disorders: Secondary | ICD-10-CM | POA: Diagnosis not present

## 2022-10-21 DIAGNOSIS — B9789 Other viral agents as the cause of diseases classified elsewhere: Secondary | ICD-10-CM

## 2022-10-21 DIAGNOSIS — L239 Allergic contact dermatitis, unspecified cause: Secondary | ICD-10-CM

## 2022-10-21 MED ORDER — PREDNISONE 10 MG PO TABS: ORAL_TABLET | ORAL | 0 refills | Status: DC

## 2022-10-21 NOTE — Progress Notes (Signed)
Subjective:    Patient ID: Regina Huber, female    DOB: March 30, 1961, 61 y.o.   MRN: [id number]  HPI  Patient presents to clinic today with complaint of fatigue, headache, ear pain, cough and shortness of breath. This started 2 weeks ago.  She describes the ear pain as burning.  She denies drainage or loss of hearing.  The cough is mostly dry and non productive.  She denies runny nose, nasal congestion, sore throat, chest pain, nausea, vomiting or diarrhea.  She was treated for URI 1 week ago with Promethazine DM and Azithromycin but she reports this did not help.  She has had a home negative COVID test.  She also reports a rash of her back.  She noticed this a few weeks ago.  The rash itches.  It has not seemed to spread.  She denies recent changes in diet or medications.  She denies recent changes in soaps, lotions or detergents.  No one in her home has a similar rash.  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 pt states she received 9 pints of blood   Vaginal intraepithelial neoplasia III (VAIN III)     Current Outpatient Medications  Medication Sig Dispense Refill   allopurinol (ZYLOPRIM) 100 MG tablet Take 1 tablet by mouth daily. 90 tablet 0   ALPRAZolam (XANAX) 0.25 MG tablet Take 1 tablet by mouth daily as needed for anxiety 20 tablet 0   amLODipine (NORVASC) 5 MG tablet Take 1 tablet (5 mg total) by mouth daily. 90 tablet 0   azithromycin (ZITHROMAX) 250 MG tablet Take 2 tabs today, then 1 tab daily x 4 days 6 tablet 0    citalopram (CELEXA) 40 MG tablet TAKE 1 TABLET BY MOUTH DAILY 90 tablet 0   colchicine 0.6 MG tablet TAKE 1 TABLET BY MOUTH 2 TIMES DAILY FOR 7 - 10 DAYS THEN TAKE AS DIRECTED AS NEEDED FOR ATTACKS. 90 tablet 1   hydrochlorothiazide (HYDRODIURIL) 25 MG tablet Take 1 tablet (25 mg total) by mouth daily. 90 tablet 0   Multiple Vitamin (MULTIVITAMIN) tablet Take 1 tablet by mouth daily.     promethazine-dextromethorphan (PROMETHAZINE-DM) 6.25-15 MG/5ML syrup Take 5 mLs by mouth 4 (four) times daily as needed for cough. 118 mL 0   rosuvastatin (CRESTOR) 5 MG tablet Take 1 tablet (5 mg total) by mouth daily. 90 tablet 0   traZODone (DESYREL) 50 MG tablet Take 1 tablet by mouth at bedtime. 90 tablet 0   No current facility-administered medications for this visit.    Allergies  Allergen Reactions   Doxycycline Other (See Comments)    Oral blisters   Penicillins Rash    - has tolerated cephalosporins    Family History  Problem Relation Age of Onset   Hypertension Mother    Diabetes Mother    Breast cancer Mother  Alcohol abuse Father    Hypertension Father    Stroke Father    Colon cancer Father 87   Heart disease Brother 39       MI   Colon polyps Brother    Colon polyps Sister    Colon polyps Sister    Colon polyps Brother    Esophageal cancer Neg Hx    Rectal cancer Neg Hx    Stomach cancer Neg Hx     Social History   Socioeconomic History   Marital status: Single    Spouse name: Not on file   Number of children: Not on file   Years of education: Not on file   Highest education level: Not on file  Occupational History   Not on file  Tobacco Use   Smoking status: Never   Smokeless tobacco: Never  Vaping Use   Vaping Use: Never used  Substance and Sexual Activity   Alcohol use: Yes    Comment: rarely   Drug use: No   Sexual activity: Not on file  Other Topics Concern   Not on file  Social History Narrative   Married, twin daughters born 76, son born 61    Works at [employer] in [employer], mining.   Social Determinants of Health   Financial Resource Strain: Not on file  Food Insecurity: Not on file  Transportation Needs: Not on file  Physical Activity: Not on file  Stress: Not on file  Social Connections: Not on file  Intimate Partner Violence: Not on file     Constitutional: Patient reports fatigue, headache.  Denies fever, malaise or abrupt weight changes.  HEENT: Patient reports ear pain.  Denies eye pain, eye redness, ringing in the ears, wax buildup, runny nose, nasal congestion, bloody nose, or sore throat. Respiratory: Patient reports cough and shortness of breath.  Denies difficulty breathing or sputum production.   Cardiovascular: Denies chest pain, chest tightness, palpitations or swelling in the hands or feet.  Gastrointestinal: Denies abdominal pain, bloating, constipation, diarrhea or blood in the stool.  Skin: Patient reports rash of back.  Denies lesions or ulcercations.    No other specific complaints in a complete review of systems (except as listed in HPI above).  Objective:   Physical Exam  BP 134/84 (BP Location: Left Arm, Patient Position: Sitting, Cuff Size: Normal)   Pulse 80   Temp (!) 96.9 F (36.1 C) (Temporal)   Wt 202 lb (91.6 kg)   SpO2 99%   BMI 31.64 kg/m     General: Appears her stated age, in NAD. Skin: Grouped, maculopapular rash noted of the center back overlying the thoracic area HEENT: Head: normal shape and size, no sinus tenderness noted; Eyes: sclera white, no icterus, conjunctiva pink, PERRLA and EOMs intact; Ears: Tm's gray and intact, normal light reflex;  Throat/Mouth: Teeth present, mucosa erythematous and moist, + PND, no exudate, lesions or ulcerations noted.  Neck: No adenopathy noted. Cardiovascular: Normal rate and rhythm. S1,S2 noted.  No murmur, rubs or gallops noted.  Pulmonary/Chest: Normal effort and positive vesicular breath sounds. No respiratory distress. No wheezes, rales or ronchi  noted.  Neurological: Alert and oriented.   BMET    Component Value Date/Time   NA 139 05/28/2022 0824   K 3.4 (L) 05/28/2022 0824   CL 99 05/28/2022 0824   CO2 29 05/28/2022 0824   GLUCOSE 93 05/28/2022 0824   BUN 11 05/28/2022 0824   CREATININE 0.89 05/28/2022 0824   CALCIUM 9.8 05/28/2022 0824  GFRNONAA 73 05/15/2021 0822   GFRAA 85 05/15/2021 0822    Lipid Panel     Component Value Date/Time   CHOL 277 (H) 05/28/2022 0824   TRIG 239 (H) 05/28/2022 0824   HDL 47 (L) 05/28/2022 0824   CHOLHDL 5.9 (H) 05/28/2022 0824   VLDL 38.4 09/20/2019 1004   LDLCALC 186 (H) 05/28/2022 0824    CBC    Component Value Date/Time   WBC 7.3 05/28/2022 0824   RBC 5.27 (H) 05/28/2022 0824   HGB 14.9 05/28/2022 0824   HCT 45.4 (H) 05/28/2022 0824   PLT 317 05/28/2022 0824   MCV 86.1 05/28/2022 0824   MCH 28.3 05/28/2022 0824   MCHC 32.8 05/28/2022 0824   RDW 13.8 05/28/2022 0824   LYMPHSABS 1.9 09/20/2019 1004   MONOABS 0.5 09/20/2019 1004   EOSABS 0.2 09/20/2019 1004   BASOSABS 0.0 09/20/2019 1004    Hgb A1C Lab Results  Component Value Date   HGBA1C 5.7 (H) 05/28/2022            Assessment & Plan:   Viral Respiratory Infection:  At this point, concern for viral illness given the fact that antibiotics did not help Encourage rest and fluids We will check COVID/flu/RSV Rx for Pred taper x6 days for symptom management 80 mg Depo-Medrol IM Continue Promethazine DM cough syrup  Allergic Dermatitis:  Rx for Pred taper x6 days for symptom management   RTC in 1 month for follow-up of chronic conditions [name], NP

## 2022-10-21 NOTE — Patient Instructions (Signed)

## 2022-10-22 LAB — SARS-COV-2 RNA, INFLUENZA A/B, AND RSV RNA, QUALITATIVE NAAT
INFLUENZA A RNA: NOT DETECTED
INFLUENZA B RNA: NOT DETECTED
RSV RNA: NOT DETECTED
SARS COV2 RNA: NOT DETECTED

## 2022-10-26 ENCOUNTER — Other Ambulatory Visit: Payer: Self-pay | Admitting: Internal Medicine

## 2022-10-28 ENCOUNTER — Ambulatory Visit: Payer: Self-pay | Admitting: *Deleted

## 2022-10-28 ENCOUNTER — Other Ambulatory Visit (HOSPITAL_COMMUNITY): Payer: Self-pay

## 2022-10-28 MED ORDER — CITALOPRAM HYDROBROMIDE 40 MG PO TABS
40.0000 mg | ORAL_TABLET | Freq: Every day | ORAL | 0 refills | Status: DC
  Filled 2022-10-28: qty 90, 90d supply, fill #0

## 2022-10-28 NOTE — Telephone Encounter (Signed)
Requested Prescriptions  Pending Prescriptions Disp Refills   citalopram (CELEXA) 40 MG tablet 90 tablet 0    Sig: TAKE 1 TABLET BY MOUTH DAILY     Psychiatry:  Antidepressants - SSRI Passed - 10/26/2022  5:15 PM      Passed - Completed PHQ-2 or PHQ-9 in the last 360 days      Passed - Valid encounter within last 6 months    Recent Outpatient Visits           1 week ago Viral respiratory illness   [facility] [place], Coralie Keens, NP   2 weeks ago Acute nasopharyngitis   [facility] [place], Coralie Keens, NP   5 months ago Encounter for general adult medical examination with abnormal findings   [facility] [place], Coralie Keens, NP   7 months ago Primary hypertension   [facility] [place], [place], NP   1 year ago Class 1 obesity due to excess calories with serious comorbidity and body mass index (BMI) of 31.0 to 31.9 in adult   [facility] [place], Coralie Keens, [place]

## 2022-10-28 NOTE — Telephone Encounter (Signed)
Message from [place] sent at 10/28/2022  8:40 AM EST  Summary: reen phlegm coming out, cough, sore throat.   Patient called in says has green phlegm coming out, cough, sore throat. She really wanted an appt today, but not available until tomorrow, so wants to speak with nurse today.          Call History   Type Contact Phone/Fax User  10/28/2022 08:39 AM EST Phone (Incoming) Diamone, Whistler (Self) [phone] Jerilynn Mages) Bayard Beaver  10/28/2022 08:39 AM EST Phone (Incoming) Richetta, Cubillos (Self) [phone] Jerilynn Mages) [place], Gerlene Burdock   Reason for Disposition  [1] MILD difficulty breathing (e.g., minimal/no SOB at rest, SOB with walking, pulse <100) AND [2] still present when not coughing  Answer Assessment - Initial Assessment Questions 1. ONSET: "When did the cough begin?"      I've been coughing and sick for 3 weeks.   I'm coughing up green mucus bad this morning.   I'm coughing, sore throat.    My mucus is green. 2. SEVERITY: "How bad is the cough today?"      It's awful.    3. SPUTUM: "Describe the color of your sputum" (none, dry cough; clear, white, yellow, green)     Green 4. HEMOPTYSIS: "Are you coughing up any blood?" If so ask: "How much?" (flecks, streaks, tablespoons, etc.)     Blood in mucus  Thick green mucus 5. DIFFICULTY BREATHING: "Are you having difficulty breathing?" If Yes, ask: "How bad is it?" (e.g., mild, moderate, severe)    - MILD: No SOB at rest, mild SOB with walking, speaks normally in sentences, can lie down, no retractions, pulse < 100.    - MODERATE: SOB at rest, SOB with minimal exertion and prefers to sit, cannot lie down flat, speaks in phrases, mild retractions, audible wheezing, pulse 100-120.    - SEVERE: Very SOB at rest, speaks in single words, struggling to breathe, sitting hunched forward, retractions, pulse > 120      Short of breath started last night 6. FEVER: "Do you have a fever?" If Yes, ask: "What is your temperature, how was it  measured, and when did it start?"     Possibly 7. CARDIAC HISTORY: "Do you have any history of heart disease?" (e.g., heart attack, congestive heart failure)      Not asked 8. LUNG HISTORY: "Do you have any history of lung disease?"  (e.g., pulmonary embolus, asthma, emphysema)     No 9. PE RISK FACTORS: "Do you have a history of blood clots?" (or: recent major surgery, recent prolonged travel, bedridden)     Not asked 10. OTHER SYMPTOMS: "Do you have any other symptoms?" (e.g., runny nose, wheezing, chest pain)       See above 11. PREGNANCY: "Is there any chance you are pregnant?" "When was your last menstrual period?"       N/A 12. TRAVEL: "Have you traveled out of the country in the last month?" (e.g., travel history, exposures)       N/A  Protocols used: Cough - Acute Productive-A-AH

## 2022-10-28 NOTE — Telephone Encounter (Signed)
  Chief Complaint: After 3 weeks of URI she is now coughing up green, thick mucus. Symptoms: Productive cough, sore throat, short of breath, thick green mucus being coughed up now.  Been on antibiotics and steroids without much benefit. Frequency: This morning now coughing up thick, green mucus with a little blood mixed in from coughing so much and so hard. Pertinent Negatives: Patient denies fever than she is aware of but could be low grade Disposition: '[]'$ ED /'[]'$ Urgent Care (no appt availability in office) / '[x]'$ Appointment(In office/virtual)/ '[]'$  [place] [facility]/ '[]'$ Home Care/ '[]'$ Refused Recommended Disposition /'[]'$  Mobile Bus/ '[]'$  Follow-up with PCP Additional Notes: Appt. Made with [name], NP 10/29/2022 at 9:00.

## 2022-10-29 ENCOUNTER — Encounter: Payer: Self-pay | Admitting: Internal Medicine

## 2022-10-29 ENCOUNTER — Ambulatory Visit (INDEPENDENT_AMBULATORY_CARE_PROVIDER_SITE_OTHER): Payer: No Typology Code available for payment source | Admitting: Internal Medicine

## 2022-10-29 ENCOUNTER — Ambulatory Visit
Admission: RE | Admit: 2022-10-29 | Discharge: 2022-10-29 | Disposition: A | Discharge status: Home / Self Care | Payer: No Typology Code available for payment source | Attending: Internal Medicine | Admitting: Internal Medicine

## 2022-10-29 ENCOUNTER — Ambulatory Visit
Admission: RE | Admit: 2022-10-29 | Discharge: 2022-10-29 | Disposition: A | Discharge status: Home / Self Care | Payer: No Typology Code available for payment source | Source: Ambulatory Visit | Attending: Internal Medicine | Admitting: Internal Medicine

## 2022-10-29 VITALS — BP 134/72 | HR 69 | Temp 96.9°F | Wt 202.0 lb

## 2022-10-29 DIAGNOSIS — R0602 Shortness of breath: Secondary | ICD-10-CM | POA: Insufficient documentation

## 2022-10-29 DIAGNOSIS — J029 Acute pharyngitis, unspecified: Secondary | ICD-10-CM

## 2022-10-29 DIAGNOSIS — H9203 Otalgia, bilateral: Secondary | ICD-10-CM

## 2022-10-29 DIAGNOSIS — R051 Acute cough: Secondary | ICD-10-CM

## 2022-10-29 MED ORDER — CLINDAMYCIN HCL 300 MG PO CAPS: 300.0000 mg | ORAL_CAPSULE | Freq: Three times a day (TID) | ORAL | 0 refills | Status: DC

## 2022-10-29 NOTE — Progress Notes (Signed)
Subjective:    Patient ID: Regina Huber, female    DOB: [birth year], 61 y.o.   MRN: [id number]  HPI  Patient presents to clinic today with complaint of ear pain, sore throat, cough and shortness of breath.  This started 3 weeks ago.  She was treated 10/30 for URI with Azithromycin.  She was reevaluated 11/6 for the same.  She tested negative for RSV, flu and COVID.  At that time she was given Depo-Medrol IM and placed on a prednisone taper.  Since that time, she reports no improvement in her symptoms.  She is having difficulty swallowing.  The cough is productive of white/green mucous.  She denies headache, runny nose, nasal congestion, chest pain, nausea, vomiting or diarrhea.  She reports fever but denies chills or body aches.  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 pt states she received 9 pints of blood   Vaginal intraepithelial neoplasia III (VAIN III)     Current Outpatient Medications  Medication Sig Dispense Refill   allopurinol (ZYLOPRIM) 100 MG tablet Take 1 tablet by mouth daily. 90 tablet 0   ALPRAZolam (XANAX) 0.25 MG tablet Take 1 tablet by mouth daily as needed for anxiety 20 tablet 0   amLODipine (NORVASC) 5 MG tablet Take 1 tablet (5 mg total) by mouth daily. 90 tablet 0   citalopram (CELEXA) 40 MG tablet Take 1 tablet (40 mg total) by mouth daily. 90 tablet 0   colchicine 0.6 MG tablet TAKE 1 TABLET BY MOUTH 2 TIMES DAILY FOR 7 - 10 DAYS THEN TAKE AS DIRECTED AS NEEDED FOR ATTACKS. 90 tablet 1   hydrochlorothiazide  (HYDRODIURIL) 25 MG tablet Take 1 tablet (25 mg total) by mouth daily. 90 tablet 0   Multiple Vitamin (MULTIVITAMIN) tablet Take 1 tablet by mouth daily.     predniSONE (DELTASONE) 10 MG tablet Take 6 tabs on day 1, 5 tabs on day 2, 4 tabs on day 3, 3 tabs on day 4, 2 tabs on day 5, 1 tab on day 6 21 tablet 0   rosuvastatin (CRESTOR) 5 MG tablet Take 1 tablet (5 mg total) by mouth daily. 90 tablet 0   traZODone (DESYREL) 50 MG tablet Take 1 tablet by mouth at bedtime. 90 tablet 0   No current facility-administered medications for this visit.    Allergies  Allergen Reactions   Doxycycline Other (See Comments)    Oral blisters   Penicillins Rash    - has tolerated cephalosporins    Family History  Problem Relation Age of Onset   Hypertension Mother    Diabetes Mother    Breast cancer Mother    Alcohol abuse Father    Hypertension Father    Stroke Father    Colon cancer Father 62   Heart disease Brother 43       MI  Colon polyps Brother    Colon polyps Sister    Colon polyps Sister    Colon polyps Brother    Esophageal cancer Neg Hx    Rectal cancer Neg Hx    Stomach cancer Neg Hx     Social History   Socioeconomic History   Marital status: Single    Spouse name: Not on file   Number of children: Not on file   Years of education: Not on file   Highest education level: Not on file  Occupational History   Not on file  Tobacco Use   Smoking status: Never   Smokeless tobacco: Never  Vaping Use   Vaping Use: Never used  Substance and Sexual Activity   Alcohol use: Yes    Comment: rarely   Drug use: No   Sexual activity: Not on file  Other Topics Concern   Not on file  Social History Narrative   Married, twin daughters born 20, son born 52   Works at [employer] in Engineer, mining.   Social Determinants of Health   Financial Resource Strain: Not on file  Food Insecurity: Not on file  Transportation Needs: Not on file  Physical Activity: Not on file  Stress: Not on  file  Social Connections: Not on file  Intimate Partner Violence: Not on file     Constitutional: Patient reports fever, malaise, fatigue.  Denies headache or abrupt weight changes.  HEENT: Patient reports ear pain and sore throat.  Denies eye pain, eye redness, ringing in the ears, wax buildup, runny nose, nasal congestion, bloody nose. Respiratory: Patient reports cough and shortness of breath.  Denies difficulty breathing, .   Cardiovascular: Denies chest pain, chest tightness, palpitations or swelling in the hands or feet.  Gastrointestinal: Denies abdominal pain, bloating, constipation, diarrhea or blood in the stool.   No other specific complaints in a complete review of systems (except as listed in HPI above).  Objective:   Physical Exam  BP 134/72 (BP Location: Right Arm, Patient Position: Sitting, Cuff Size: Large)   Pulse 69   Temp (!) 96.9 F (36.1 C) (Temporal)   Wt 202 lb (91.6 kg)   SpO2 100%   BMI 31.64 kg/m   Wt Readings from Last 3 Encounters:  10/21/22 202 lb (91.6 kg)  05/28/22 193 lb (87.5 kg)  08/08/21 195 lb 12.8 oz (88.8 kg)    General: Appears her stated age, appears unwell but in NAD. Skin: Warm, dry and intact. No rashes noted. HEENT: Head: normal shape and size; Eyes: sclera white, no icterus, conjunctiva pink, PERRLA and EOMs intact; Ears: Tm's gray and intact, normal light reflex; Throat/Mouth: Teeth present, mucosa erythematous and moist, no exudate, lesions or ulcerations noted.  Neck: No adenopathy noted. Cardiovascular: Normal rate and rhythm. S1,S2 noted.  No murmur, rubs or gallops noted. No JVD or BLE edema. No carotid bruits noted. Pulmonary/Chest: Normal effort and positive vesicular breath sounds. No respiratory distress. No wheezes, rales or ronchi noted.  Neurological: Alert and oriented.    BMET    Component Value Date/Time   NA 139 05/28/2022 0824   K 3.4 (L) 05/28/2022 0824   CL 99 05/28/2022 0824   CO2 29 05/28/2022 0824    GLUCOSE 93 05/28/2022 0824   BUN 11 05/28/2022 0824   CREATININE 0.89 05/28/2022 0824   CALCIUM 9.8 05/28/2022 0824   GFRNONAA 73 05/15/2021 0822   GFRAA 85 05/15/2021 0822    Lipid Panel     Component Value Date/Time  CHOL 277 (H) 05/28/2022 0824   TRIG 239 (H) 05/28/2022 0824   HDL 47 (L) 05/28/2022 0824   CHOLHDL 5.9 (H) 05/28/2022 0824   VLDL 38.4 09/20/2019 1004   LDLCALC 186 (H) 05/28/2022 0824    CBC    Component Value Date/Time   WBC 7.3 05/28/2022 0824   RBC 5.27 (H) 05/28/2022 0824   HGB 14.9 05/28/2022 0824   HCT 45.4 (H) 05/28/2022 0824   PLT 317 05/28/2022 0824   MCV 86.1 05/28/2022 0824   MCH 28.3 05/28/2022 0824   MCHC 32.8 05/28/2022 0824   RDW 13.8 05/28/2022 0824   LYMPHSABS 1.9 09/20/2019 1004   MONOABS 0.5 09/20/2019 1004   EOSABS 0.2 09/20/2019 1004   BASOSABS 0.0 09/20/2019 1004    Hgb A1C Lab Results  Component Value Date   HGBA1C 5.7 (H) 05/28/2022            Assessment & Plan:   Ear Pain, Sore Throat, Cough and Shortness of Breath:  CBC and c-Met today Chest x-ray for further evaluation Rx for Clindamycin 300 mg 3 times daily x7 days Encourage rest and fluids Okay to take Delsym, Robitussin or NyQuil OTC as needed for cough  RTC in 1 month for follow-up of chronic conditions [name], NP

## 2022-10-29 NOTE — Patient Instructions (Signed)

## 2022-10-30 LAB — COMPLETE METABOLIC PANEL WITH GFR
AG Ratio: 1.4 (calc) (ref 1.0–2.5)
ALT: 13 U/L (ref 6–29)
AST: 16 U/L (ref 10–35)
Albumin: 4.1 g/dL (ref 3.6–5.1)
Alkaline phosphatase (APISO): 94 U/L (ref 37–153)
BUN: 11 mg/dL (ref 7–25)
CO2: 32 mmol/L (ref 20–32)
Calcium: 9.4 mg/dL (ref 8.6–10.4)
Chloride: 96 mmol/L — ABNORMAL LOW (ref 98–110)
Creat: 0.89 mg/dL (ref 0.50–1.05)
Globulin: 2.9 g/dL (calc) (ref 1.9–3.7)
Glucose, Bld: 73 mg/dL (ref 65–99)
Potassium: 3.8 mmol/L (ref 3.5–5.3)
Sodium: 139 mmol/L (ref 135–146)
Total Bilirubin: 0.6 mg/dL (ref 0.2–1.2)
Total Protein: 7 g/dL (ref 6.1–8.1)
eGFR: 74 mL/min/{1.73_m2} (ref >=60)

## 2022-10-30 LAB — CBC
HCT: 44.7 % (ref 35.0–45.0)
Hemoglobin: 15 g/dL (ref 11.7–15.5)
MCH: 28.7 pg (ref 27.0–33.0)
MCHC: 33.6 g/dL (ref 32.0–36.0)
MCV: 85.5 fL (ref 80.0–100.0)
MPV: 9.8 fL (ref 7.5–12.5)
Platelets: 365 10*3/uL (ref 140–400)
RBC: 5.23 10*6/uL — ABNORMAL HIGH (ref 3.80–5.10)
RDW: 13.5 % (ref 11.0–15.0)
WBC: 13.5 10*3/uL — ABNORMAL HIGH (ref 3.8–10.8)

## 2022-11-11 ENCOUNTER — Other Ambulatory Visit: Payer: Self-pay | Admitting: Internal Medicine

## 2022-11-11 DIAGNOSIS — F32A Depression, unspecified: Secondary | ICD-10-CM

## 2022-11-12 ENCOUNTER — Other Ambulatory Visit (HOSPITAL_COMMUNITY): Payer: Self-pay

## 2022-11-12 MED ORDER — ALPRAZOLAM 0.25 MG PO TABS
0.2500 mg | ORAL_TABLET | Freq: Every day | ORAL | 0 refills | Status: DC | PRN
  Filled 2022-11-12: qty 20, 20d supply, fill #0

## 2022-11-12 MED ORDER — ALLOPURINOL 100 MG PO TABS
100.0000 mg | ORAL_TABLET | Freq: Every day | ORAL | 0 refills | Status: DC
  Filled 2022-11-12: qty 90, 90d supply, fill #0

## 2022-11-12 MED ORDER — AMLODIPINE BESYLATE 5 MG PO TABS
5.0000 mg | ORAL_TABLET | Freq: Every day | ORAL | 0 refills | Status: DC
  Filled 2022-11-12: qty 90, 90d supply, fill #0

## 2022-11-12 MED ORDER — ROSUVASTATIN CALCIUM 5 MG PO TABS
5.0000 mg | ORAL_TABLET | Freq: Every day | ORAL | 1 refills | Status: DC
  Filled 2022-11-12: qty 90, 90d supply, fill #0
  Filled 2023-02-10: qty 90, 90d supply, fill #1

## 2022-11-12 NOTE — Telephone Encounter (Signed)
Requested medication (s) are due for refill today: yes   Requested medication (s) are on the active medication list: yes  Last refill:  xanax: 09/18/22 #20        Allopurinol: 08/14/22 #90   Future visit scheduled: no  Notes to clinic:  xanax not delegated to NT to reorder and Allopurinol needs labs in date   Requested Prescriptions  Pending Prescriptions Disp Refills   allopurinol (ZYLOPRIM) 100 MG tablet 90 tablet     Sig: Take 1 tablet by mouth daily.     Endocrinology:  Gout Agents - allopurinol Failed - 11/11/2022 12:21 PM      Failed - Uric Acid in normal range and within 360 days    Uric Acid, Serum  Date Value Ref Range Status  05/15/2021 8.7 (H) 2.5 - 7.0 mg/dL Final    Comment:    Therapeutic target for gout patients: <6.0 mg/dL .          Failed - CBC within normal limits and completed in the last 12 months    WBC  Date Value Ref Range Status  10/29/2022 13.5 (H) 3.8 - 10.8 Thousand/uL Final   RBC  Date Value Ref Range Status  10/29/2022 5.23 (H) 3.80 - 5.10 Million/uL Final   Hemoglobin  Date Value Ref Range Status  10/29/2022 15.0 11.7 - 15.5 g/dL Final   HCT  Date Value Ref Range Status  10/29/2022 44.7 35.0 - 45.0 % Final   MCHC  Date Value Ref Range Status  10/29/2022 33.6 32.0 - 36.0 g/dL Final   [facility]  Date Value Ref Range Status  10/29/2022 28.7 27.0 - 33.0 pg Final   MCV  Date Value Ref Range Status  10/29/2022 85.5 80.0 - 100.0 fL Final   No results found for: "PLTCOUNTKUC", "LABPLAT", "POCPLA" RDW  Date Value Ref Range Status  10/29/2022 13.5 11.0 - 15.0 % Final         Passed - Cr in normal range and within 360 days    Creat  Date Value Ref Range Status  10/29/2022 0.89 0.50 - 1.05 mg/dL Final         Passed - Valid encounter within last 12 months    Recent Outpatient Visits           2 weeks ago Acute cough   [facility] [place], Coralie Keens, NP   3 weeks ago Viral respiratory illness   [facility] [place], Coralie Keens, NP   4 weeks ago Acute nasopharyngitis   [facility] [place], Coralie Keens, NP   5 months ago Encounter for general adult medical examination with abnormal findings   [facility], Coralie Keens, NP   7 months ago Primary hypertension   [facility] [place], [place] W, NP               ALPRAZolam Duanne Moron) 0.25 MG tablet 20 tablet     Sig: Take 1 tablet by mouth daily as needed for anxiety     Not Delegated - Psychiatry: Anxiolytics/Hypnotics 2 Failed - 11/11/2022 12:21 PM      Failed - This refill cannot be delegated      Failed - Urine Drug Screen completed in last 360 days      Passed - Patient is not pregnant      Passed - Valid encounter within last 6 months    Recent Outpatient Visits  2 weeks ago Acute cough   [facility] [place], Coralie Keens, NP   3 weeks ago Viral respiratory illness   [facility] [place], Coralie Keens, NP   4 weeks ago Acute nasopharyngitis   [facility] [place], Coralie Keens, NP   5 months ago Encounter for general adult medical examination with abnormal findings   [facility] [place], Coralie Keens, NP   7 months ago Primary hypertension   [facility] [place], Coralie Keens, NP              Signed Prescriptions Disp Refills   amLODipine (NORVASC) 5 MG tablet 90 tablet 0    Sig: Take 1 tablet (5 mg total) by mouth daily.     Cardiovascular: Calcium Channel Blockers 2 Passed - 11/11/2022 12:21 PM      Passed - Last BP in normal range    BP Readings from Last 1 Encounters:  10/29/22 134/72         Passed - Last Heart Rate in normal range    Pulse Readings from Last 1 Encounters:  10/29/22 69         Passed - Valid encounter within last 6 months    Recent Outpatient Visits           2 weeks ago Acute cough   [facility] [place], Coralie Keens, NP   3 weeks ago Viral respiratory illness   [facility] [place], Coralie Keens, NP   4 weeks ago Acute nasopharyngitis   [facility] [place], Coralie Keens, NP   5 months ago Encounter for general adult medical examination with abnormal findings   [facility] [place], Coralie Keens, NP   7 months ago Primary hypertension   [facility] [place], Coralie Keens, NP               rosuvastatin (CRESTOR) 5 MG tablet 90 tablet 1    Sig: Take 1 tablet (5 mg total) by mouth daily.     Cardiovascular:  Antilipid - Statins 2 Failed - 11/11/2022 12:21 PM      Failed - Lipid Panel in normal range within the last 12 months    Cholesterol  Date Value Ref Range Status  05/28/2022 277 (H) <200 mg/dL Final   LDL Cholesterol (Calc)  Date Value Ref Range Status  05/28/2022 186 (H) mg/dL (calc) Final    Comment:    Reference range: <100 . Desirable range <100 mg/dL for primary prevention;   <70 mg/dL for patients with CHD or diabetic patients  with > or = 2 CHD risk factors. Marland Kitchen LDL-C is now calculated using the Martin-Hopkins  calculation, which is a validated novel method providing  better accuracy than the Friedewald equation in the  estimation of LDL-C.  Cresenciano Genre et al. Annamaria Helling. 3710;626(94): 2061-2068  (http://education.[facility].com/faq/FAQ164)    Direct LDL  Date Value Ref Range Status  03/25/2016 116.0 mg/dL Final    Comment:    Optimal:  <100 mg/dLNear or Above Optimal:  100-129 mg/dLBorderline High:  130-159 mg/dLHigh:  160-189 mg/dLVery High:  >190 mg/dL   HDL  Date Value Ref Range Status  05/28/2022 47 (L) > OR = 50 mg/dL Final   Triglycerides  Date Value Ref Range Status  05/28/2022 239 (H) <150 mg/dL Final    Comment:    . If a non-fasting specimen was collected, consider repeat triglyceride testing on a  fasting specimen if clinically indicated.  Yates Decamp et al. J. of Clin. Lipidol. 3005;1:102-111. Marland Kitchen          Passed - Cr in normal range and within 360 days    Creat   Date Value Ref Range Status  10/29/2022 0.89 0.50 - 1.05 mg/dL Final         Passed - Patient is not pregnant      Passed - Valid encounter within last 12 months    Recent Outpatient Visits           2 weeks ago Acute cough   [facility] [place], Coralie Keens, NP   3 weeks ago Viral respiratory illness   [facility] [place], Coralie Keens, NP   4 weeks ago Acute nasopharyngitis   [facility] [place], Coralie Keens, NP   5 months ago Encounter for general adult medical examination with abnormal findings   [facility], Coralie Keens, NP   7 months ago Primary hypertension   [facility] [place], Coralie Keens, [place]

## 2022-11-12 NOTE — Telephone Encounter (Signed)
Requested Prescriptions  Pending Prescriptions Disp Refills   amLODipine (NORVASC) 5 MG tablet 90 tablet 0    Sig: Take 1 tablet (5 mg total) by mouth daily.     Cardiovascular: Calcium Channel Blockers 2 Passed - 11/11/2022 12:21 PM      Passed - Last BP in normal range    BP Readings from Last 1 Encounters:  10/29/22 134/72         Passed - Last Heart Rate in normal range    Pulse Readings from Last 1 Encounters:  10/29/22 69         Passed - Valid encounter within last 6 months    Recent Outpatient Visits           2 weeks ago Acute cough   [facility] [place], Coralie Keens, NP   3 weeks ago Viral respiratory illness   [facility] [place], Coralie Keens, NP   4 weeks ago Acute nasopharyngitis   [facility] [place], Coralie Keens, NP   5 months ago Encounter for general adult medical examination with abnormal findings   [facility] [place], Coralie Keens, NP   7 months ago Primary hypertension   [facility] [place], Coralie Keens, NP               allopurinol (ZYLOPRIM) 100 MG tablet 90 tablet     Sig: Take 1 tablet by mouth daily.     Endocrinology:  Gout Agents - allopurinol Failed - 11/11/2022 12:21 PM      Failed - Uric Acid in normal range and within 360 days    Uric Acid, Serum  Date Value Ref Range Status  05/15/2021 8.7 (H) 2.5 - 7.0 mg/dL Final    Comment:    Therapeutic target for gout patients: <6.0 mg/dL .          Failed - CBC within normal limits and completed in the last 12 months    WBC  Date Value Ref Range Status  10/29/2022 13.5 (H) 3.8 - 10.8 Thousand/uL Final   RBC  Date Value Ref Range Status  10/29/2022 5.23 (H) 3.80 - 5.10 Million/uL Final   Hemoglobin  Date Value Ref Range Status  10/29/2022 15.0 11.7 - 15.5 g/dL Final   HCT  Date Value Ref Range Status  10/29/2022 44.7 35.0 - 45.0 % Final   MCHC  Date Value Ref Range Status  10/29/2022 33.6 32.0 - 36.0 g/dL Final   [facility]   Date Value Ref Range Status  10/29/2022 28.7 27.0 - 33.0 pg Final   MCV  Date Value Ref Range Status  10/29/2022 85.5 80.0 - 100.0 fL Final   No results found for: "PLTCOUNTKUC", "LABPLAT", "POCPLA" RDW  Date Value Ref Range Status  10/29/2022 13.5 11.0 - 15.0 % Final         Passed - Cr in normal range and within 360 days    Creat  Date Value Ref Range Status  10/29/2022 0.89 0.50 - 1.05 mg/dL Final         Passed - Valid encounter within last 12 months    Recent Outpatient Visits           2 weeks ago Acute cough   [facility] [place], Coralie Keens, NP   3 weeks ago Viral respiratory illness   [facility] [place], Coralie Keens, NP   4 weeks ago Acute nasopharyngitis   [place], NP   5 months ago Encounter for general adult medical examination with abnormal findings   [facility] [place], Coralie Keens, NP   7 months ago Primary hypertension   [facility] [place], Coralie Keens, NP               rosuvastatin (CRESTOR) 5 MG tablet 90 tablet 1    Sig: Take 1 tablet (5 mg total) by mouth daily.     Cardiovascular:  Antilipid - Statins 2 Failed - 11/11/2022 12:21 PM      Failed - Lipid Panel in normal range within the last 12 months    Cholesterol  Date Value Ref Range Status  05/28/2022 277 (H) <200 mg/dL Final   LDL Cholesterol (Calc)  Date Value Ref Range Status  05/28/2022 186 (H) mg/dL (calc) Final    Comment:    Reference range: <100 . Desirable range <100 mg/dL for primary prevention;   <70 mg/dL for patients with CHD or diabetic patients  with > or = 2 CHD risk factors. Marland Kitchen LDL-C is now calculated using the Martin-Hopkins  calculation, which is a validated novel method providing  better accuracy than the Friedewald equation in the  estimation of LDL-C.  Cresenciano Genre et al. Annamaria Helling. 4235;361(44): 2061-2068  (http://education.[facility].com/faq/FAQ164)    Direct LDL  Date  Value Ref Range Status  03/25/2016 116.0 mg/dL Final    Comment:    Optimal:  <100 mg/dLNear or Above Optimal:  100-129 mg/dLBorderline High:  130-159 mg/dLHigh:  160-189 mg/dLVery High:  >190 mg/dL   HDL  Date Value Ref Range Status  05/28/2022 47 (L) > OR = 50 mg/dL Final   Triglycerides  Date Value Ref Range Status  05/28/2022 239 (H) <150 mg/dL Final    Comment:    . If a non-fasting specimen was collected, consider repeat triglyceride testing on a fasting specimen if clinically indicated.  Yates Decamp et al. J. of Clin. Lipidol. 3154;0:086-761. Marland Kitchen          Passed - Cr in normal range and within 360 days    Creat  Date Value Ref Range Status  10/29/2022 0.89 0.50 - 1.05 mg/dL Final         Passed - Patient is not pregnant      Passed - Valid encounter within last 12 months    Recent Outpatient Visits           2 weeks ago Acute cough   [facility] [place], Coralie Keens, NP   3 weeks ago Viral respiratory illness   [facility] [place], Coralie Keens, NP   4 weeks ago Acute nasopharyngitis   [facility] [place], Coralie Keens, NP   5 months ago Encounter for general adult medical examination with abnormal findings   [facility], Coralie Keens, NP   7 months ago Primary hypertension   [facility] [place], [place] W, NP               ALPRAZolam Duanne Moron) 0.25 MG tablet 20 tablet     Sig: Take 1 tablet by mouth daily as needed for anxiety     Not Delegated - Psychiatry: Anxiolytics/Hypnotics 2 Failed - 11/11/2022 12:21 PM      Failed - This refill cannot be delegated      Failed - Urine Drug Screen completed in last 360 days      Passed - Patient is not  pregnant      Passed - Valid encounter within last 6 months    Recent Outpatient Visits           2 weeks ago Acute cough   [facility] [place], Coralie Keens, NP   3 weeks ago Viral respiratory illness   [facility] [place],  Coralie Keens, NP   4 weeks ago Acute nasopharyngitis   [facility] [place], Coralie Keens, NP   5 months ago Encounter for general adult medical examination with abnormal findings   [facility] [place], Coralie Keens, NP   7 months ago Primary hypertension   [facility] [place], Coralie Keens, [place]

## 2022-11-13 ENCOUNTER — Other Ambulatory Visit (HOSPITAL_COMMUNITY): Payer: Self-pay

## 2022-11-18 ENCOUNTER — Encounter: Payer: Self-pay | Admitting: Internal Medicine

## 2022-12-11 ENCOUNTER — Other Ambulatory Visit: Payer: Self-pay | Admitting: Internal Medicine

## 2022-12-11 DIAGNOSIS — F419 Anxiety disorder, unspecified: Secondary | ICD-10-CM

## 2022-12-12 ENCOUNTER — Other Ambulatory Visit: Payer: Self-pay

## 2022-12-12 ENCOUNTER — Other Ambulatory Visit (HOSPITAL_COMMUNITY): Payer: Self-pay

## 2022-12-12 MED ORDER — ALPRAZOLAM 0.25 MG PO TABS
0.2500 mg | ORAL_TABLET | Freq: Every day | ORAL | 0 refills | Status: DC | PRN
  Filled 2022-12-12: qty 20, 20d supply, fill #0

## 2022-12-12 NOTE — Telephone Encounter (Signed)
Requested medication (s) are due for refill today: routing for approval  Requested medication (s) are on the active medication list: yes  Last refill:  11/12/22  Future visit scheduled:yes  Notes to clinic:  Unable to refill per protocol, cannot delegate.      Requested Prescriptions  Pending Prescriptions Disp Refills   ALPRAZolam (XANAX) 0.25 MG tablet 20 tablet 0    Sig: Take 1 tablet by mouth daily as needed for anxiety     Not Delegated - Psychiatry: Anxiolytics/Hypnotics 2 Failed - 12/11/2022  4:17 PM      Failed - This refill cannot be delegated      Failed - Urine Drug Screen completed in last 360 days      Passed - Patient is not pregnant      Passed - Valid encounter within last 6 months    Recent Outpatient Visits           1 month ago Acute cough   [facility] [place], Coralie Keens, NP   1 month ago Viral respiratory illness   [facility] [place], Coralie Keens, NP   1 month ago Acute nasopharyngitis   [facility] [place], Coralie Keens, NP   6 months ago Encounter for general adult medical examination with abnormal findings   [facility] [place], Coralie Keens, NP   8 months ago Primary hypertension   [facility] [place], Coralie Keens, NP

## 2022-12-16 ENCOUNTER — Other Ambulatory Visit: Payer: Self-pay | Admitting: Internal Medicine

## 2022-12-16 DIAGNOSIS — I10 Essential (primary) hypertension: Secondary | ICD-10-CM

## 2022-12-17 ENCOUNTER — Other Ambulatory Visit: Payer: Self-pay

## 2022-12-17 MED ORDER — TRAZODONE HCL 50 MG PO TABS
50.0000 mg | ORAL_TABLET | Freq: Every evening | ORAL | 0 refills | Status: DC
  Filled 2022-12-17: qty 90, 90d supply, fill #0

## 2022-12-17 MED ORDER — HYDROCHLOROTHIAZIDE 25 MG PO TABS
25.0000 mg | ORAL_TABLET | Freq: Every day | ORAL | 0 refills | Status: DC
  Filled 2022-12-17 – 2023-01-01 (×2): qty 90, 90d supply, fill #0

## 2022-12-17 NOTE — Telephone Encounter (Signed)
Requested Prescriptions  Pending Prescriptions Disp Refills   traZODone (DESYREL) 50 MG tablet 90 tablet 0    Sig: Take 1 tablet by mouth at bedtime.     Psychiatry: Antidepressants - Serotonin Modulator Passed - 12/16/2022 12:31 PM      Passed - Completed PHQ-2 or PHQ-9 in the last 360 days      Passed - Valid encounter within last 6 months    Recent Outpatient Visits           1 month ago Acute cough   [facility] [place], Coralie Keens, NP   1 month ago Viral respiratory illness   [facility] [place], Coralie Keens, NP   2 months ago Acute nasopharyngitis   [facility] [place], Coralie Keens, NP   6 months ago Encounter for general adult medical examination with abnormal findings   [facility] [place], Coralie Keens, NP   8 months ago Primary hypertension   [facility] [place], Coralie Keens, NP               hydrochlorothiazide (HYDRODIURIL) 25 MG tablet 90 tablet 0    Sig: Take 1 tablet (25 mg total) by mouth daily.     Cardiovascular: Diuretics - Thiazide Passed - 12/16/2022 12:31 PM      Passed - Cr in normal range and within 180 days    Creat  Date Value Ref Range Status  10/29/2022 0.89 0.50 - 1.05 mg/dL Final         Passed - K in normal range and within 180 days    Potassium  Date Value Ref Range Status  10/29/2022 3.8 3.5 - 5.3 mmol/L Final         Passed - Na in normal range and within 180 days    Sodium  Date Value Ref Range Status  10/29/2022 139 135 - 146 mmol/L Final         Passed - Last BP in normal range    BP Readings from Last 1 Encounters:  10/29/22 134/72         Passed - Valid encounter within last 6 months    Recent Outpatient Visits           1 month ago Acute cough   [facility] [place], Coralie Keens, NP   1 month ago Viral respiratory illness   [facility] [place], Coralie Keens, NP   2 months ago Acute nasopharyngitis   [facility] [place], Coralie Keens, NP   6 months ago Encounter for general adult medical examination with abnormal findings   [facility] [place], Coralie Keens, NP   8 months ago Primary hypertension   [facility] [place], Coralie Keens, NP

## 2022-12-18 ENCOUNTER — Other Ambulatory Visit: Payer: Self-pay

## 2022-12-18 ENCOUNTER — Other Ambulatory Visit (HOSPITAL_COMMUNITY): Payer: Self-pay

## 2022-12-20 ENCOUNTER — Other Ambulatory Visit (HOSPITAL_COMMUNITY): Payer: Self-pay

## 2022-12-20 ENCOUNTER — Encounter (HOSPITAL_COMMUNITY): Payer: Self-pay

## 2023-01-01 ENCOUNTER — Other Ambulatory Visit (HOSPITAL_COMMUNITY): Payer: Self-pay

## 2023-01-01 ENCOUNTER — Other Ambulatory Visit: Payer: Self-pay

## 2023-01-06 ENCOUNTER — Other Ambulatory Visit (HOSPITAL_COMMUNITY): Payer: Self-pay

## 2023-01-10 DIAGNOSIS — Z1231 Encounter for screening mammogram for malignant neoplasm of breast: Secondary | ICD-10-CM | POA: Diagnosis not present

## 2023-01-10 DIAGNOSIS — Z6832 Body mass index (BMI) 32.0-32.9, adult: Secondary | ICD-10-CM | POA: Diagnosis not present

## 2023-01-10 DIAGNOSIS — Z01419 Encounter for gynecological examination (general) (routine) without abnormal findings: Secondary | ICD-10-CM | POA: Diagnosis not present

## 2023-01-10 DIAGNOSIS — R319 Hematuria, unspecified: Secondary | ICD-10-CM | POA: Diagnosis not present

## 2023-01-13 ENCOUNTER — Other Ambulatory Visit (HOSPITAL_COMMUNITY): Payer: Self-pay

## 2023-01-13 ENCOUNTER — Other Ambulatory Visit: Payer: Self-pay

## 2023-01-13 MED ORDER — CLOTRIMAZOLE 1 % EX CREA
1.0000 | TOPICAL_CREAM | Freq: Two times a day (BID) | CUTANEOUS | 2 refills | Status: DC | PRN
  Filled 2023-01-13: qty 30, 15d supply, fill #0

## 2023-01-14 ENCOUNTER — Other Ambulatory Visit: Payer: Self-pay

## 2023-01-20 ENCOUNTER — Other Ambulatory Visit: Payer: Self-pay | Admitting: Internal Medicine

## 2023-01-20 DIAGNOSIS — F32A Depression, unspecified: Secondary | ICD-10-CM

## 2023-01-21 ENCOUNTER — Other Ambulatory Visit (HOSPITAL_COMMUNITY): Payer: Self-pay

## 2023-01-21 ENCOUNTER — Telehealth: Payer: 59 | Admitting: Nurse Practitioner

## 2023-01-21 DIAGNOSIS — J40 Bronchitis, not specified as acute or chronic: Secondary | ICD-10-CM

## 2023-01-21 MED ORDER — AZITHROMYCIN 250 MG PO TABS: ORAL_TABLET | ORAL | 0 refills | Status: DC

## 2023-01-21 MED ORDER — BENZONATATE 100 MG PO CAPS: 100.0000 mg | ORAL_CAPSULE | Freq: Three times a day (TID) | ORAL | 0 refills | Status: DC | PRN

## 2023-01-21 MED ORDER — CITALOPRAM HYDROBROMIDE 40 MG PO TABS
40.0000 mg | ORAL_TABLET | Freq: Every day | ORAL | 0 refills | Status: DC
  Filled 2023-01-21: qty 90, 90d supply, fill #0

## 2023-01-21 NOTE — Telephone Encounter (Signed)
Requested Prescriptions  Pending Prescriptions Disp Refills   citalopram (CELEXA) 40 MG tablet 90 tablet 0    Sig: Take 1 tablet (40 mg total) by mouth daily.     Psychiatry:  Antidepressants - SSRI Passed - 01/20/2023  5:41 PM      Passed - Completed PHQ-2 or PHQ-9 in the last 360 days      Passed - Valid encounter within last 6 months    Recent Outpatient Visits           2 months ago Acute cough   [facility] [place], Coralie Keens, NP   3 months ago Viral respiratory illness   [facility] [place], Coralie Keens, NP   3 months ago Acute nasopharyngitis   [facility] [place], Coralie Keens, NP   7 months ago Encounter for general adult medical examination with abnormal findings   [facility] [place], Coralie Keens, NP   10 months ago Primary hypertension   [facility] [place], Coralie Keens, NP               ALPRAZolam Duanne Moron) 0.25 MG tablet 20 tablet 0    Sig: Take 1 tablet (0.25 mg total) by mouth daily as needed for anxiety.     Not Delegated - Psychiatry: Anxiolytics/Hypnotics 2 Failed - 01/20/2023  5:41 PM      Failed - This refill cannot be delegated      Failed - Urine Drug Screen completed in last 360 days      Passed - Patient is not pregnant      Passed - Valid encounter within last 6 months    Recent Outpatient Visits           2 months ago Acute cough   [facility] [place], Coralie Keens, NP   3 months ago Viral respiratory illness   [facility] [place], Coralie Keens, NP   3 months ago Acute nasopharyngitis   [facility] [place], Coralie Keens, NP   7 months ago Encounter for general adult medical examination with abnormal findings   [facility] [place], Coralie Keens, NP   10 months ago Primary hypertension   [facility] [place], Coralie Keens,  [place]

## 2023-01-21 NOTE — Telephone Encounter (Signed)
Requested medication (s) are due for refill today: yes  Requested medication (s) are on the active medication list: yes  Last refill:  12/12/22  Future visit scheduled: yes  Notes to clinic:  Unable to refill per protocol, cannot delegate.      Requested Prescriptions  Pending Prescriptions Disp Refills   ALPRAZolam (XANAX) 0.25 MG tablet 20 tablet 0    Sig: Take 1 tablet (0.25 mg total) by mouth daily as needed for anxiety.     Not Delegated - Psychiatry: Anxiolytics/Hypnotics 2 Failed - 01/20/2023  5:41 PM      Failed - This refill cannot be delegated      Failed - Urine Drug Screen completed in last 360 days      Passed - Patient is not pregnant      Passed - Valid encounter within last 6 months    Recent Outpatient Visits           2 months ago Acute cough   [facility] [place], Coralie Keens, NP   3 months ago Viral respiratory illness   [facility] [place], Coralie Keens, NP   3 months ago Acute nasopharyngitis   [facility] [place], Coralie Keens, NP   7 months ago Encounter for general adult medical examination with abnormal findings   [facility] [place], Coralie Keens, NP   10 months ago Primary hypertension   [facility] [place], Coralie Keens, NP              Signed Prescriptions Disp Refills   citalopram (CELEXA) 40 MG tablet 90 tablet 0    Sig: Take 1 tablet (40 mg total) by mouth daily.     Psychiatry:  Antidepressants - SSRI Passed - 01/20/2023  5:41 PM      Passed - Completed PHQ-2 or PHQ-9 in the last 360 days      Passed - Valid encounter within last 6 months    Recent Outpatient Visits           2 months ago Acute cough   [facility] [place], Coralie Keens, NP   3 months ago Viral respiratory illness   [facility] [place], Coralie Keens, NP   3 months ago Acute nasopharyngitis   [facility] [place], Coralie Keens, NP   7 months ago Encounter for general adult medical examination with abnormal findings   [facility] [place], Coralie Keens, NP   10 months ago Primary hypertension   [facility] [place], Coralie Keens, [place]

## 2023-01-21 NOTE — Progress Notes (Signed)
We are sorry that you are not feeling well.  Here is how we plan to help!  Based on your presentation I believe you most likely have A cough due to bacteria.  When patients have a fever and a productive cough with a change in color or increased sputum production, we are concerned about bacterial bronchitis.  If left untreated it can progress to pneumonia.  If your symptoms do not improve with your treatment plan it is important that you contact your provider.   I have prescribed Azithromyin 250 mg: two tablets now and then one tablet daily for 4 additonal days    In addition you may use A prescription cough medication called Tessalon Perles '100mg'$ . You may take 1-2 capsules every 8 hours as needed for your cough.   From your responses in the eVisit questionnaire you describe inflammation in the upper respiratory tract which is causing a significant cough.  This is commonly called Bronchitis and has four common causes:   Allergies Viral Infections Acid Reflux Bacterial Infection Allergies, viruses and acid reflux are treated by controlling symptoms or eliminating the cause. An example might be a cough caused by taking certain blood pressure medications. You stop the cough by changing the medication. Another example might be a cough caused by acid reflux. Controlling the reflux helps control the cough.  HOME CARE Only take medications as instructed by your medical team. Complete the entire course of an antibiotic. Drink plenty of fluids and get plenty of rest. Avoid close contacts especially the very young and the elderly Cover your mouth if you cough or cough into your sleeve. Always remember to wash your hands A steam or ultrasonic humidifier can help congestion.   GET HELP RIGHT AWAY IF: You develop worsening fever. You become short of breath You cough up blood. Your symptoms persist after you have completed your treatment plan MAKE SURE YOU  Understand these instructions. Will watch  your condition. Will get help right away if you are not doing well or get worse.    Thank you for choosing an e-visit.  Your e-visit answers were reviewed by a board certified advanced clinical practitioner to complete your personal care plan. Depending upon the condition, your plan could have included both over the counter or prescription medications.  Please review your pharmacy choice. Make sure the pharmacy is open so you can pick up prescription now. If there is a problem, you may contact your provider through [employer] and have the prescription routed to another pharmacy.  Your safety is important to [place]. If you have drug allergies check your prescription carefully.   For the next 24 hours you can use MyChart to ask questions about today's visit, request a non-urgent call back, or ask for a work or school excuse. You will get an email in the next two days asking about your experience. I hope that your e-visit has been valuable and will speed your recovery.   Meds ordered this encounter  Medications   azithromycin (ZITHROMAX) 250 MG tablet    Sig: Take 2 tablets on day 1, then 1 tablet daily on days 2 through 5    Dispense:  6 tablet    Refill:  0   benzonatate (TESSALON) 100 MG capsule    Sig: Take 1 capsule (100 mg total) by mouth 3 (three) times daily as needed.    Dispense:  30 capsule    Refill:  0     I spent approximately 7 minutes  reviewing the patient's history, current symptoms and coordinating their plan of care today.

## 2023-01-22 ENCOUNTER — Other Ambulatory Visit: Payer: Self-pay

## 2023-01-22 ENCOUNTER — Other Ambulatory Visit (HOSPITAL_COMMUNITY): Payer: Self-pay

## 2023-01-22 MED ORDER — ALPRAZOLAM 0.25 MG PO TABS
0.2500 mg | ORAL_TABLET | Freq: Every day | ORAL | 0 refills | Status: DC | PRN
  Filled 2023-01-22: qty 20, 20d supply, fill #0

## 2023-01-24 ENCOUNTER — Telehealth (INDEPENDENT_AMBULATORY_CARE_PROVIDER_SITE_OTHER): Payer: 59 | Admitting: Internal Medicine

## 2023-01-24 ENCOUNTER — Encounter: Payer: Self-pay | Admitting: Internal Medicine

## 2023-01-24 DIAGNOSIS — B9689 Other specified bacterial agents as the cause of diseases classified elsewhere: Secondary | ICD-10-CM

## 2023-01-24 DIAGNOSIS — J208 Acute bronchitis due to other specified organisms: Secondary | ICD-10-CM

## 2023-01-24 MED ORDER — ALBUTEROL SULFATE HFA 108 (90 BASE) MCG/ACT IN AERS: 2.0000 | INHALATION_SPRAY | Freq: Four times a day (QID) | RESPIRATORY_TRACT | 0 refills | Status: DC | PRN

## 2023-01-24 MED ORDER — PROMETHAZINE-DM 6.25-15 MG/5ML PO SYRP: 5.0000 mL | ORAL_SOLUTION | Freq: Four times a day (QID) | ORAL | 0 refills | Status: DC | PRN

## 2023-01-24 MED ORDER — CEFDINIR 300 MG PO CAPS: 300.0000 mg | ORAL_CAPSULE | Freq: Two times a day (BID) | ORAL | 0 refills | Status: DC

## 2023-01-24 NOTE — Patient Instructions (Signed)
  Acute Bronchitis, Adult  Acute bronchitis is when air tubes in the lungs (bronchi) suddenly get swollen. The condition can make it hard for you to breathe. In adults, acute bronchitis usually goes away within 2 weeks. A cough caused by bronchitis may last up to 3 weeks. Smoking, allergies, and asthma can make the condition worse. What are the causes? Germs that cause cold and flu (viruses). The most common cause of this condition is the virus that causes the common cold. Bacteria. Substances that bother (irritate) the lungs, including: Smoke from cigarettes and other types of tobacco. Dust and pollen. Fumes from chemicals, gases, or burned fuel. Indoor or outdoor air pollution. What increases the risk? A weak body's defense system. This is also called the immune system. Any condition that affects your lungs and breathing, such as asthma. What are the signs or symptoms? A cough. Coughing up clear, yellow, or green mucus. Making high-pitched whistling sounds when you breathe, most often when you breathe out (wheezing). Runny or stuffy nose. Having too much mucus in your lungs (chest congestion). Shortness of breath. Body aches. A sore throat. How is this treated? Acute bronchitis may go away over time without treatment. Your doctor may tell you to: Drink more fluids. This will help thin your mucus so it is easier to cough up. Use a device that gets medicine into your lungs (inhaler). Use a vaporizer or a humidifier. These are machines that add water to the air. This helps with coughing and poor breathing. Take a medicine that thins mucus and helps clear it from your lungs. Take a medicine that prevents or stops coughing. It is not common to take an antibiotic medicine for this condition. Follow these instructions at home:  Take over-the-counter and prescription medicines only as told by your doctor. Use an inhaler, vaporizer, or humidifier as told by your doctor. Take two  teaspoons (10 mL) of honey at bedtime. This helps lessen your coughing at night. Drink enough fluid to keep your pee (urine) pale yellow. Do not smoke or use any products that contain nicotine or tobacco. If you need help quitting, ask your doctor. Get a lot of rest. Return to your normal activities when your doctor says that it is safe. Keep all follow-up visits. How is this prevented?  Wash your hands often with soap and water for at least 20 seconds. If you cannot use soap and water, use hand sanitizer. Avoid contact with people who have cold symptoms. Try not to touch your mouth, nose, or eyes with your hands. Avoid breathing in smoke or chemical fumes. Make sure to get the flu shot every year. Contact a doctor if: Your symptoms do not get better in 2 weeks. You have trouble coughing up the mucus. Your cough keeps you awake at night. You have a fever. Get help right away if: You cough up blood. You have chest pain. You have very bad shortness of breath. You faint or keep feeling like you are going to faint. You have a very bad headache. Your fever or chills get worse. These symptoms may be an emergency. Get help right away. Call your local emergency services (911 in the [place] Do not wait to see if the symptoms will go away. Do not drive yourself to the hospital. Summary Acute bronchitis is when air tubes in the lungs (bronchi) suddenly get swollen. In adults, acute bronchitis usually goes away within 2 weeks. Drink more fluids. This will help thin your mucus so it   is easier to cough up. Take over-the-counter and prescription medicines only as told by your doctor. Contact a doctor if your symptoms do not improve after 2 weeks of treatment. This information is not intended to replace advice given to you by your health care provider. Make sure you discuss any questions you have with your health care provider. Document Revised: 04/04/2021 Document Reviewed: 04/04/2021 Elsevier  Patient Education  2023 Elsevier Inc.  

## 2023-01-24 NOTE — Progress Notes (Signed)
Virtual Visit via Video Note  I connected with Regina Huber on 01/24/23 at 11:20 AM EST by a video enabled telemedicine application and verified that I am speaking with the correct person using two identifiers.  Location: Patient: Home Provider: Office  Persons participating in this video call: [name], NP and Mindi Slicker   I discussed the limitations of evaluation and management by telemedicine and the availability of in person appointments. The patient expressed understanding and agreed to proceed.  History of Present Illness:  Patient reports headache, runny nose, cough and chest congestion.  This started 1 week ago.  She reports the headache is pounding.  She is blowing green mucus out of her nose.  The cough is productive of green mucus.  She denies nasal congestion, ear pain, sore throat, shortness of breath, nausea, vomiting or diarrhea.  She denies fever or body aches but has had chills and fatigue.  She recently did an e-visit 2/6 for the same and was diagnosed with bronchitis.  She was treated with Azithromycin and Tessalon.  She reports this is not helping.  She reports her daughter had a leftover Prednisone packs which she started yesterday.  She has had sick contacts with similar symptoms.    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 pt Huber she received 9 pints of blood   Vaginal intraepithelial neoplasia III (VAIN III)     Current Outpatient Medications  Medication Sig Dispense Refill   allopurinol (ZYLOPRIM) 100 MG tablet Take 1 tablet  (100 mg total) by mouth daily. 90 tablet 0   ALPRAZolam (XANAX) 0.25 MG tablet Take 1 tablet (0.25 mg total) by mouth daily as needed for anxiety. 20 tablet 0   amLODipine (NORVASC) 5 MG tablet Take 1 tablet (5 mg total) by mouth daily. 90 tablet 0   azithromycin (ZITHROMAX) 250 MG tablet Take 2 tablets on day 1, then 1 tablet daily on days 2 through 5 6 tablet 0   benzonatate (TESSALON) 100 MG capsule Take 1 capsule (100 mg total) by mouth 3 (three) times daily as needed. 30 capsule 0   citalopram (CELEXA) 40 MG tablet Take 1 tablet (40 mg total) by mouth daily. 90 tablet 0   clindamycin (CLEOCIN) 300 MG capsule Take 1 capsule (300 mg total) by mouth 3 (three) times daily. X 7 days 221 capsule 0   clotrimazole (LOTRIMIN) 1 % cream Apply 1 Application topically (to the affected and surrounding areas) 2 (two) times daily (in the morning and evening) as needed. 30 g 2   colchicine 0.6 MG tablet TAKE 1 TABLET BY MOUTH 2 TIMES DAILY FOR 7 - 10 DAYS THEN TAKE AS DIRECTED AS NEEDED FOR ATTACKS. 90 tablet 1   hydrochlorothiazide (HYDRODIURIL) 25 MG tablet Take 1 tablet (25 mg total) by mouth daily. 90 tablet 0   Multiple Vitamin (MULTIVITAMIN) tablet Take 1 tablet by mouth daily.  predniSONE (DELTASONE) 10 MG tablet Take 6 tabs on day 1, 5 tabs on day 2, 4 tabs on day 3, 3 tabs on day 4, 2 tabs on day 5, 1 tab on day 6 (Patient not taking: Reported on 10/29/2022) 21 tablet 0   rosuvastatin (CRESTOR) 5 MG tablet Take 1 tablet (5 mg total) by mouth daily. 90 tablet 1   traZODone (DESYREL) 50 MG tablet Take 1 tablet (50 mg total) by mouth at bedtime. 90 tablet 0   No current facility-administered medications for this visit.    Allergies  Allergen Reactions   Doxycycline Other (See Comments)    Oral blisters   Penicillins Rash    - has tolerated cephalosporins    Family History  Problem Relation Age of Onset   Hypertension Mother    Diabetes Mother    Breast cancer Mother    Alcohol abuse  Father    Hypertension Father    Stroke Father    Colon cancer Father 67   Heart disease Brother 36       MI   Colon polyps Brother    Colon polyps Sister    Colon polyps Sister    Colon polyps Brother    Esophageal cancer Neg Hx    Rectal cancer Neg Hx    Stomach cancer Neg Hx     Social History   Socioeconomic History   Marital status: Single    Spouse name: Not on file   Number of children: Not on file   Years of education: Not on file   Highest education level: Not on file  Occupational History   Not on file  Tobacco Use   Smoking status: Never   Smokeless tobacco: Never  Vaping Use   Vaping Use: Never used  Substance and Sexual Activity   Alcohol use: Yes    Comment: rarely   Drug use: No   Sexual activity: Not on file  Other Topics Concern   Not on file  Social History Narrative   Married, twin daughters born 86, son born 30   Works at [employer] in [employer], mining.   Social Determinants of Health   Financial Resource Strain: Not on file  Food Insecurity: Not on file  Transportation Needs: Not on file  Physical Activity: Not on file  Stress: Not on file  Social Connections: Not on file  Intimate Partner Violence: Not on file     Constitutional: Patient reports headache and fatigue.  Denies fever, malaise, or abrupt weight changes.  HEENT: Patient reports runny nose.  Denies eye pain, eye redness, ear pain, ringing in the ears, wax buildup, runny nose, nasal congestion, bloody nose, or sore throat. Respiratory: Patient reports cough.  Denies difficulty breathing, shortness of breath, or sputum production.   Cardiovascular: Denies chest pain, chest tightness, palpitations or swelling in the hands or feet.  Gastrointestinal: Denies abdominal pain, bloating, constipation, diarrhea or blood in the stool.  Psych: Denies anxiety, depression, SI/HI.  No other specific complaints in a complete review of systems (except as listed in HPI  above).  Observations/Objective:  Wt Readings from Last 3 Encounters:  10/29/22 202 lb (91.6 kg)  10/21/22 202 lb (91.6 kg)  05/28/22 193 lb (87.5 kg)    General: Appears her stated age, appears unwell but in NAD. HEENT: Head: normal shape and size; Nose: Congestion noted; Throat/Mouth: Hoarseness noted  Pulmonary/Chest: Normal effort.  Deep cough noted.  No respiratory distress.  Neurological: Alert and oriented.   BMET  Component Value Date/Time   NA 139 10/29/2022 0902   K 3.8 10/29/2022 0902   CL 96 (L) 10/29/2022 0902   CO2 32 10/29/2022 0902   GLUCOSE 73 10/29/2022 0902   BUN 11 10/29/2022 0902   CREATININE 0.89 10/29/2022 0902   CALCIUM 9.4 10/29/2022 0902   GFRNONAA 73 05/15/2021 0822   GFRAA 85 05/15/2021 0822    Lipid Panel     Component Value Date/Time   CHOL 277 (H) 05/28/2022 0824   TRIG 239 (H) 05/28/2022 0824   HDL 47 (L) 05/28/2022 0824   CHOLHDL 5.9 (H) 05/28/2022 0824   VLDL 38.4 09/20/2019 1004   LDLCALC 186 (H) 05/28/2022 0824    CBC    Component Value Date/Time   WBC 13.5 (H) 10/29/2022 0902   RBC 5.23 (H) 10/29/2022 0902   HGB 15.0 10/29/2022 0902   HCT 44.7 10/29/2022 0902   PLT 365 10/29/2022 0902   MCV 85.5 10/29/2022 0902   MCH 28.7 10/29/2022 0902   MCHC 33.6 10/29/2022 0902   RDW 13.5 10/29/2022 0902   LYMPHSABS 1.9 09/20/2019 1004   MONOABS 0.5 09/20/2019 1004   EOSABS 0.2 09/20/2019 1004   BASOSABS 0.0 09/20/2019 1004    Hgb A1C Lab Results  Component Value Date   HGBA1C 5.7 (H) 05/28/2022       Assessment and Plan:  Acute Bacterial Bronchitis:  Encourage rest and fluids No indication for COVID or flu testing given duration of symptoms Stop Azithromycin Rx for Omnicef 300 mg twice daily x 10 days Rx for Albuterol inhaler 1 to 2 puffs every 4-6 hours as needed Rx for Promethazine DM cough syrup  Schedule annual follow-up for chronic conditions  Follow Up Instructions:    I discussed the assessment and  treatment plan with the patient. The patient was provided an opportunity to ask questions and all were answered. The patient agreed with the plan and demonstrated an understanding of the instructions.   The patient was advised to call back or seek an in-person evaluation if the symptoms worsen or if the condition fails to improve as anticipated.   [name], NP

## 2023-01-27 ENCOUNTER — Ambulatory Visit
Admission: RE | Admit: 2023-01-27 | Discharge: 2023-01-27 | Disposition: A | Discharge status: Home / Self Care | Payer: 59 | Attending: Physician Assistant | Admitting: Physician Assistant

## 2023-01-27 ENCOUNTER — Ambulatory Visit: Payer: 59 | Admitting: Physician Assistant

## 2023-01-27 ENCOUNTER — Other Ambulatory Visit (HOSPITAL_COMMUNITY)
Admission: RE | Admit: 2023-01-27 | Discharge: 2023-01-27 | Disposition: A | Discharge status: Home / Self Care | Payer: 59 | Source: Ambulatory Visit | Attending: Physician Assistant | Admitting: Physician Assistant

## 2023-01-27 ENCOUNTER — Encounter: Payer: Self-pay | Admitting: Physician Assistant

## 2023-01-27 ENCOUNTER — Ambulatory Visit (INDEPENDENT_AMBULATORY_CARE_PROVIDER_SITE_OTHER): Payer: 59 | Admitting: Physician Assistant

## 2023-01-27 ENCOUNTER — Ambulatory Visit
Admission: RE | Admit: 2023-01-27 | Discharge: 2023-01-27 | Disposition: A | Discharge status: Home / Self Care | Payer: 59 | Source: Ambulatory Visit | Attending: Physician Assistant | Admitting: Physician Assistant

## 2023-01-27 ENCOUNTER — Other Ambulatory Visit: Payer: Self-pay

## 2023-01-27 VITALS — BP 126/66 | HR 75 | Temp 96.5°F | Wt 203.0 lb

## 2023-01-27 DIAGNOSIS — R300 Dysuria: Secondary | ICD-10-CM | POA: Insufficient documentation

## 2023-01-27 DIAGNOSIS — R059 Cough, unspecified: Secondary | ICD-10-CM | POA: Diagnosis not present

## 2023-01-27 DIAGNOSIS — R062 Wheezing: Secondary | ICD-10-CM | POA: Diagnosis not present

## 2023-01-27 LAB — POCT URINALYSIS DIPSTICK
Bilirubin, UA: NEGATIVE
Glucose, UA: NEGATIVE
Ketones, UA: NEGATIVE
Leukocytes, UA: NEGATIVE
Nitrite, UA: NEGATIVE
Protein, UA: NEGATIVE
Spec Grav, UA: <=1.005 — AB (ref 1.010–1.025)
Urobilinogen, UA: 0.2 E.U./dL
pH, UA: 7.5 (ref 5.0–8.0)

## 2023-01-27 MED ORDER — METHYLPREDNISOLONE 4 MG PO TBPK
ORAL_TABLET | ORAL | 0 refills | Status: DC
  Filled 2023-01-27: qty 21, 6d supply, fill #0

## 2023-01-27 MED ORDER — METHYLPREDNISOLONE ACETATE 40 MG/ML IJ SUSP
40.0000 mg | Freq: Once | INTRAMUSCULAR | Status: AC
  Administered 2023-01-27: 40 mg via INTRAMUSCULAR

## 2023-01-27 NOTE — Progress Notes (Signed)
Your chest xray did not show signs of active pneumonia at this time. The steroid injection should help with the wheezing and I have sent in a Medrol pack to extend the steroid further to help. Please finish the Omnicef antibiotic and we will keep you updated on the results of your urinalysis and swab.

## 2023-01-27 NOTE — Progress Notes (Signed)
Acute Office Visit   Patient: Regina Huber   DOB: 12/22/1960   62 y.o. Female  MRN: [id number] Visit Date: 01/27/2023  Today's healthcare provider: Dani Gobble Numa Schroeter, PA-C  Introduced myself to the patient as a Journalist, newspaper and provided education on APPs in clinical practice.    Chief Complaint  Patient presents with   Bronchitis    She reports ongoing wheezing and shortness of breath despite using medications from most recent PCP visit.    Subjective    HPI HPI     Bronchitis    Additional comments: She reports ongoing wheezing and shortness of breath despite using medications from most recent PCP visit.       Last edited by Almon Register, PA-C on 01/27/2023 11:42 AM.        She reports she was sick about a month ago but those symptoms seemed to resolve until about a week ago  She reports last week she started to have bronchitis like symptoms and was treated by her PCP with Omnicef, Promethazine- DM and albuterol inhaler   She reports continued chest congestion, SOB, wheezing especially when she lays down, coughing spells,  She reports productive cough with green sputum when she can get it to come up   Interventions: Mucinex, prescribed medications, Dayquil, Nyquil, inhaler, she tried taking Prednisone last week  She has been trying to drink plenty of water and orange juice as well   She reports for the last few days she has had irritation when urinating and when she wipes there has been a pink tinge to the paper    Medications: Outpatient Medications Prior to Visit  Medication Sig   albuterol (VENTOLIN HFA) 108 (90 Base) MCG/ACT inhaler Inhale 2 puffs into the lungs every 6 (six) hours as needed for wheezing or shortness of breath.   allopurinol (ZYLOPRIM) 100 MG tablet Take 1 tablet (100 mg total) by mouth daily.   ALPRAZolam (XANAX) 0.25 MG tablet Take 1 tablet (0.25 mg total) by mouth daily as needed for anxiety.   amLODipine (NORVASC) 5 MG tablet Take 1 tablet (5  mg total) by mouth daily.   benzonatate (TESSALON) 100 MG capsule Take 1 capsule (100 mg total) by mouth 3 (three) times daily as needed.   cefdinir (OMNICEF) 300 MG capsule Take 1 capsule (300 mg total) by mouth 2 (two) times daily.   citalopram (CELEXA) 40 MG tablet Take 1 tablet (40 mg total) by mouth daily.   colchicine 0.6 MG tablet TAKE 1 TABLET BY MOUTH 2 TIMES DAILY FOR 7 - 10 DAYS THEN TAKE AS DIRECTED AS NEEDED FOR ATTACKS.   hydrochlorothiazide (HYDRODIURIL) 25 MG tablet Take 1 tablet (25 mg total) by mouth daily.   Multiple Vitamin (MULTIVITAMIN) tablet Take 1 tablet by mouth daily.   promethazine-dextromethorphan (PROMETHAZINE-DM) 6.25-15 MG/5ML syrup Take 5 mLs by mouth 4 (four) times daily as needed.   rosuvastatin (CRESTOR) 5 MG tablet Take 1 tablet (5 mg total) by mouth daily.   traZODone (DESYREL) 50 MG tablet Take 1 tablet (50 mg total) by mouth at bedtime.   No facility-administered medications prior to visit.    Review of Systems  Constitutional:  Positive for chills, diaphoresis and fatigue. Negative for fever.  HENT:  Positive for ear pain, rhinorrhea and sinus pressure. Negative for congestion, postnasal drip, sinus pain, sore throat and trouble swallowing.   Respiratory:  Positive for cough, shortness of breath and wheezing.  Gastrointestinal:  Negative for diarrhea, nausea and vomiting.  Genitourinary:  Positive for dysuria.  Musculoskeletal:  Positive for myalgias.  Neurological:  Positive for headaches.        Objective    BP 126/66 (BP Location: Left Arm, Patient Position: Sitting, Cuff Size: Large)   Pulse 75   Temp (!) 96.5 F (35.8 C) (Temporal)   Wt 203 lb (92.1 kg)   SpO2 99%   BMI 31.79 kg/m     Physical Exam Vitals reviewed.  Constitutional:      General: She is awake. She is not in acute distress.    Appearance: Normal appearance. She is well-groomed. She is not ill-appearing, toxic-appearing or diaphoretic.  HENT:     Head:  Normocephalic and atraumatic.     Right Ear: Hearing and ear canal normal. Tympanic membrane is erythematous. Tympanic membrane is not injected, scarred, perforated, retracted or bulging.     Left Ear: Hearing and ear canal normal. Tympanic membrane is erythematous. Tympanic membrane is not injected, scarred, perforated, retracted or bulging.     Nose: No congestion or rhinorrhea.     Mouth/Throat:     Lips: Pink.     Mouth: Mucous membranes are moist.     Pharynx: Oropharynx is clear. Uvula midline.  Eyes:     Extraocular Movements: Extraocular movements intact.     Conjunctiva/sclera: Conjunctivae normal.     Pupils: Pupils are equal, round, and reactive to light.  Cardiovascular:     Rate and Rhythm: Normal rate and regular rhythm.     Pulses: Normal pulses.     Heart sounds: Normal heart sounds. No murmur heard.    No friction rub. No gallop.  Pulmonary:     Effort: Pulmonary effort is normal.     Breath sounds: No decreased air movement. Examination of the right-middle field reveals wheezing. Examination of the right-lower field reveals wheezing and rhonchi. Examination of the left-lower field reveals wheezing and rhonchi. Wheezing and rhonchi present. No decreased breath sounds or rales.  Musculoskeletal:     Cervical back: Normal range of motion and neck supple.     Right lower leg: No edema.     Left lower leg: No edema.  Lymphadenopathy:     Head:     Right side of head: No submental, submandibular or preauricular adenopathy.     Left side of head: No submental, submandibular or preauricular adenopathy.     Cervical:     Right cervical: No superficial or posterior cervical adenopathy.    Left cervical: No superficial or posterior cervical adenopathy.     Upper Body:     Right upper body: No supraclavicular adenopathy.     Left upper body: No supraclavicular adenopathy.  Skin:    General: Skin is warm and dry.  Neurological:     General: No focal deficit present.      Mental Status: She is alert and oriented to person, place, and time. Mental status is at baseline.  Psychiatric:        Mood and Affect: Mood normal.        Behavior: Behavior normal. Behavior is cooperative.        Thought Content: Thought content normal.        Judgment: Judgment normal.       Results for orders placed or performed in visit on 01/27/23  POCT urinalysis dipstick  Result Value Ref Range   Color, UA     Clarity, UA  Glucose, UA Negative Negative   Bilirubin, UA Negative    Ketones, UA Negative    Spec Grav, UA <=1.005 (A) 1.010 - 1.025   Blood, UA Trace    pH, UA 7.5 5.0 - 8.0   Protein, UA Negative Negative   Urobilinogen, UA 0.2 0.2 or 1.0 E.U./dL   Nitrite, UA Negative    Leukocytes, UA Negative Negative   Appearance     Odor      Assessment & Plan      No follow-ups on file.      Problem List Items Addressed This Visit   None Visit Diagnoses     Wheezing on expiration    -  Primary Acute, ongoing Reports persistent wheezing and SOB along with productive coughing since she was seen on 01/21/23  She has been taking the [facility] she was given along with Promethazine-DM, albuterol inhaler, along with OTC medications CXR ordered today- no acute cardiopulmonary disease noted on CXR today  Will provide Depo-Medrol 40 mg IM today in office and extend with Medrol dose pack to assist with wheezing  Suspect likely bronchitis or bronchospasm causing coughing at this time Recommend she finish Omnicef course and continue with previously prescribed medications along with Medrol pack Follow up as needed for persistent or progressing symptoms    Relevant Medications   methylPREDNISolone acetate (DEPO-MEDROL) injection 40 mg (Completed)   methylPREDNISolone (MEDROL DOSEPAK) 4 MG TBPK tablet   Other Relevant Orders   DG Chest 2 View (Completed)   Dysuria     Acute, new concern Reports dysuria and increased frequency for the past few days UA was positive for  trace blood, will send for culture to rule out UTI Awaiting results of cervicovaginal swab as she may have a yeast infection due to recent abx use  Results to dictate further management Follow up as needed    Relevant Orders   POCT urinalysis dipstick (Completed)   Cervicovaginal ancillary only   Urine Culture        No follow-ups on file.   I, Jahmeir Geisen E Sholonda Jobst, PA-C, have reviewed all documentation for this visit. The documentation on 01/27/23 for the exam, diagnosis, procedures, and orders are all accurate and complete.   Talitha Givens, MHS, PA-C [facility]

## 2023-01-28 LAB — URINE CULTURE
MICRO NUMBER:: 14552463
Result:: NO GROWTH
SPECIMEN QUALITY:: ADEQUATE

## 2023-01-29 LAB — CERVICOVAGINAL ANCILLARY ONLY
Bacterial Vaginitis (gardnerella): NEGATIVE
Candida Glabrata: NEGATIVE
Candida Vaginitis: NEGATIVE
Chlamydia: NEGATIVE
Comment: NEGATIVE
Comment: NEGATIVE
Comment: NEGATIVE
Comment: NEGATIVE
Comment: NEGATIVE
Comment: NORMAL
Neisseria Gonorrhea: NEGATIVE
Trichomonas: NEGATIVE

## 2023-02-10 ENCOUNTER — Other Ambulatory Visit (HOSPITAL_COMMUNITY): Payer: Self-pay

## 2023-02-10 ENCOUNTER — Other Ambulatory Visit: Payer: Self-pay | Admitting: Internal Medicine

## 2023-02-11 ENCOUNTER — Other Ambulatory Visit (HOSPITAL_BASED_OUTPATIENT_CLINIC_OR_DEPARTMENT_OTHER): Payer: Self-pay

## 2023-02-11 ENCOUNTER — Other Ambulatory Visit: Payer: Self-pay

## 2023-02-11 MED ORDER — ALLOPURINOL 100 MG PO TABS
100.0000 mg | ORAL_TABLET | Freq: Every day | ORAL | 0 refills | Status: DC
  Filled 2023-02-11: qty 90, 90d supply, fill #0

## 2023-02-11 MED ORDER — AMLODIPINE BESYLATE 5 MG PO TABS
5.0000 mg | ORAL_TABLET | Freq: Every day | ORAL | 0 refills | Status: DC
  Filled 2023-02-11: qty 90, 90d supply, fill #0

## 2023-02-11 NOTE — Telephone Encounter (Signed)
Requested Prescriptions  Pending Prescriptions Disp Refills   amLODipine (NORVASC) 5 MG tablet 90 tablet 0    Sig: Take 1 tablet (5 mg total) by mouth daily.     Cardiovascular: Calcium Channel Blockers 2 Passed - 02/10/2023  6:52 AM      Passed - Last BP in normal range    BP Readings from Last 1 Encounters:  01/27/23 126/66         Passed - Last Heart Rate in normal range    Pulse Readings from Last 1 Encounters:  01/27/23 75         Passed - Valid encounter within last 6 months    Recent Outpatient Visits           2 weeks ago Wheezing on expiration   [facility], Bruce E, [place]   2 weeks ago Acute bacterial bronchitis   [facility] [place], Coralie Keens, NP   3 months ago Acute cough   [facility] [place], Coralie Keens, NP   3 months ago Viral respiratory illness   [facility] [place], Coralie Keens, NP   4 months ago Acute nasopharyngitis   [facility] [place], [place] W, NP               allopurinol (ZYLOPRIM) 100 MG tablet 90 tablet 0    Sig: Take 1 tablet (100 mg total) by mouth daily.     Endocrinology:  Gout Agents - allopurinol Failed - 02/10/2023  6:52 AM      Failed - Uric Acid in normal range and within 360 days    Uric Acid, Serum  Date Value Ref Range Status  05/15/2021 8.7 (H) 2.5 - 7.0 mg/dL Final    Comment:    Therapeutic target for gout patients: <6.0 mg/dL .          Failed - CBC within normal limits and completed in the last 12 months    WBC  Date Value Ref Range Status  10/29/2022 13.5 (H) 3.8 - 10.8 Thousand/uL Final   RBC  Date Value Ref Range Status  10/29/2022 5.23 (H) 3.80 - 5.10 Million/uL Final   Hemoglobin  Date Value Ref Range Status  10/29/2022 15.0 11.7 - 15.5 g/dL Final   HCT  Date Value Ref Range Status  10/29/2022 44.7 35.0 - 45.0 % Final   MCHC  Date Value Ref Range Status  10/29/2022 33.6  32.0 - 36.0 g/dL Final   [facility]  Date Value Ref Range Status  10/29/2022 28.7 27.0 - 33.0 pg Final   MCV  Date Value Ref Range Status  10/29/2022 85.5 80.0 - 100.0 fL Final   No results found for: "PLTCOUNTKUC", "LABPLAT", "POCPLA" RDW  Date Value Ref Range Status  10/29/2022 13.5 11.0 - 15.0 % Final         Passed - Cr in normal range and within 360 days    Creat  Date Value Ref Range Status  10/29/2022 0.89 0.50 - 1.05 mg/dL Final         Passed - Valid encounter within last 12 months    Recent Outpatient Visits           2 weeks ago Wheezing on expiration   [place], [place]   2 weeks ago Acute bacterial bronchitis   [place],  Coralie Keens, NP   3 months ago Acute cough   [facility] [place], Coralie Keens, NP   3 months ago Viral respiratory illness   [facility] [place], Coralie Keens, NP   4 months ago Acute nasopharyngitis   [facility] [place], Coralie Keens, [place]

## 2023-03-04 ENCOUNTER — Other Ambulatory Visit: Payer: Self-pay | Admitting: Internal Medicine

## 2023-03-04 DIAGNOSIS — F419 Anxiety disorder, unspecified: Secondary | ICD-10-CM

## 2023-03-05 ENCOUNTER — Other Ambulatory Visit (HOSPITAL_COMMUNITY): Payer: Self-pay

## 2023-03-05 MED ORDER — ALPRAZOLAM 0.25 MG PO TABS
0.2500 mg | ORAL_TABLET | Freq: Every day | ORAL | 0 refills | Status: DC | PRN
  Filled 2023-03-05: qty 20, 20d supply, fill #0

## 2023-03-05 NOTE — Telephone Encounter (Signed)
Pt stated she will be [place] today and asked if Rollene Fare can send through her prescription for ALPRAZolam (XANAX) 0.25 MG tablet / please advise

## 2023-03-30 ENCOUNTER — Other Ambulatory Visit: Payer: Self-pay | Admitting: Internal Medicine

## 2023-03-30 DIAGNOSIS — I10 Essential (primary) hypertension: Secondary | ICD-10-CM

## 2023-03-30 DIAGNOSIS — F419 Anxiety disorder, unspecified: Secondary | ICD-10-CM

## 2023-04-01 ENCOUNTER — Other Ambulatory Visit: Payer: Self-pay

## 2023-04-01 ENCOUNTER — Other Ambulatory Visit (HOSPITAL_COMMUNITY): Payer: Self-pay

## 2023-04-01 MED ORDER — TRAZODONE HCL 50 MG PO TABS
50.0000 mg | ORAL_TABLET | Freq: Every evening | ORAL | 0 refills | Status: DC
  Filled 2023-04-01: qty 90, 90d supply, fill #0

## 2023-04-01 MED ORDER — HYDROCHLOROTHIAZIDE 25 MG PO TABS
25.0000 mg | ORAL_TABLET | Freq: Every day | ORAL | 0 refills | Status: DC
  Filled 2023-04-01: qty 90, 90d supply, fill #0

## 2023-04-01 MED ORDER — ALPRAZOLAM 0.25 MG PO TABS
0.2500 mg | ORAL_TABLET | Freq: Every day | ORAL | 0 refills | Status: DC | PRN
  Filled 2023-04-01 – 2023-05-06 (×2): qty 20, 20d supply, fill #0

## 2023-04-01 NOTE — Telephone Encounter (Signed)
Requested medication (s) are due for refill today: yes  Requested medication (s) are on the active medication list: yes    Last refill: 03/05/23  #20  0 refills  Future visit scheduled no  Notes to clinic:Not delegated, please review. Thank you.  Requested Prescriptions  Pending Prescriptions Disp Refills   ALPRAZolam (XANAX) 0.25 MG tablet 20 tablet 0    Sig: Take 1 tablet (0.25 mg total) by mouth daily as needed for anxiety.     Not Delegated - Psychiatry: Anxiolytics/Hypnotics 2 Failed - 03/30/2023  3:44 PM      Failed - This refill cannot be delegated      Failed - Urine Drug Screen completed in last 360 days      Passed - Patient is not pregnant      Passed - Valid encounter within last 6 months    Recent Outpatient Visits           2 months ago Wheezing on expiration   [place] [facility], Hanna E, [place]   2 months ago Acute bacterial bronchitis   [place] [facility] [place], Salvadore Oxford, NP   5 months ago Acute cough   [place] [place], Salvadore Oxford, NP   5 months ago Viral respiratory illness   [place] [facility] [place], Salvadore Oxford, NP   5 months ago Acute nasopharyngitis   [place] [facility] [place], Salvadore Oxford, NP              Signed Prescriptions Disp Refills   traZODone (DESYREL) 50 MG tablet 90 tablet 0    Sig: Take 1 tablet (50 mg total) by mouth at bedtime.     Psychiatry: Antidepressants - Serotonin Modulator Passed - 03/30/2023  3:44 PM      Passed - Completed PHQ-2 or PHQ-9 in the last 360 days      Passed - Valid encounter within last 6 months    Recent Outpatient Visits           2 months ago Wheezing on expiration   [facility], [place] E, [place]   2 months ago Acute bacterial bronchitis   [facility] [place], Salvadore Oxford, NP   5 months ago Acute cough   [place] [facility] [place], Salvadore Oxford, NP   5 months ago Viral respiratory illness   [place] [facility] [place], Salvadore Oxford, NP   5 months ago Acute nasopharyngitis    [facility] [place], Salvadore Oxford, NP               hydrochlorothiazide (HYDRODIURIL) 25 MG tablet 90 tablet 0    Sig: Take 1 tablet (25 mg total) by mouth daily.     Cardiovascular: Diuretics - Thiazide Passed - 03/30/2023  3:44 PM      Passed - Cr in normal range and within 180 days    Creat  Date Value Ref Range Status  10/29/2022 0.89 0.50 - 1.05 mg/dL Final         Passed - K in normal range and within 180 days    Potassium  Date Value Ref Range Status  10/29/2022 3.8 3.5 - 5.3 mmol/L Final         Passed - Na in normal range and within 180 days    Sodium  Date Value  Ref Range Status  10/29/2022 139 135 - 146 mmol/L Final         Passed - Last BP in normal range    BP Readings from Last 1 Encounters:  01/27/23 126/66         Passed - Valid encounter within last 6 months    Recent Outpatient Visits           2 months ago Wheezing on expiration   [place] [facility]um, [place] E, [place]   2 months ago Acute bacterial bronchitis   [facility] [place], Salvadore Oxford, NP   5 months ago Acute cough   [facility] [place], Salvadore Oxford, NP   5 months ago Viral respiratory illness   [place] [facility] [place], Salvadore Oxford, NP   5 months ago Acute nasopharyngitis   [place] [facility] [place], Salvadore Oxford, [place]

## 2023-04-01 NOTE — Telephone Encounter (Signed)
Requested Prescriptions  Pending Prescriptions Disp Refills   ALPRAZolam (XANAX) 0.25 MG tablet 20 tablet 0    Sig: Take 1 tablet (0.25 mg total) by mouth daily as needed for anxiety.     Not Delegated - Psychiatry: Anxiolytics/Hypnotics 2 Failed - 03/30/2023  3:44 PM      Failed - This refill cannot be delegated      Failed - Urine Drug Screen completed in last 360 days      Passed - Patient is not pregnant      Passed - Valid encounter within last 6 months    Recent Outpatient Visits           2 months ago Wheezing on expiration   [place] [facility]um, [place] E, [place]   2 months ago Acute bacterial bronchitis   [place] [facility] [place], Salvadore Oxford, NP   5 months ago Acute cough   [facility] [place], Salvadore Oxford, NP   5 months ago Viral respiratory illness   [place] [facility] [place], Salvadore Oxford, NP   5 months ago Acute nasopharyngitis   [place] [facility] [place], [place] W, NP               traZODone (DESYREL) 50 MG tablet 90 tablet 0    Sig: Take 1 tablet (50 mg total) by mouth at bedtime.     Psychiatry: Antidepressants - Serotonin Modulator Passed - 03/30/2023  3:44 PM      Passed - Completed PHQ-2 or PHQ-9 in the last 360 days      Passed - Valid encounter within last 6 months    Recent Outpatient Visits           2 months ago Wheezing on expiration   [place] [facility] M[facility]um, [place] E, [place]   2 months ago Acute bacterial bronchitis   [place] [facility] [place], Salvadore Oxford, NP   5 months ago Acute cough   [place] [facility] [place], Salvadore Oxford, NP   5 months ago Viral respiratory illness   [place] [facility] [place], Salvadore Oxford, NP   5 months ago Acute nasopharyngitis   [place] [facility] [place], Salvadore Oxford, NP               hydrochlorothiazide (HYDRODIURIL) 25 MG  tablet 90 tablet 0    Sig: Take 1 tablet (25 mg total) by mouth daily.     Cardiovascular: Diuretics - Thiazide Passed - 03/30/2023  3:44 PM      Passed - Cr in normal range and within 180 days    Creat  Date Value Ref Range Status  10/29/2022 0.89 0.50 - 1.05 mg/dL Final         Passed - K in normal range and within 180 days    Potassium  Date Value Ref Range Status  10/29/2022 3.8 3.5 - 5.3 mmol/L Final         Passed - Na in normal range and within 180 days    Sodium  Date Value Ref Range Status  10/29/2022 139 135 - 146 mmol/L Final         Passed - Last BP in normal range    BP Readings from Last 1 Encounters:  01/27/23 126/66         Passed - Valid encounter within  last 6 months    Recent Outpatient Visits           2 months ago Wheezing on expiration   [place] [facility]um, [place] E, [place]   2 months ago Acute bacterial bronchitis   [place] [facility] [place], Salvadore Oxford, NP   5 months ago Acute cough   [place] [facility] [place], Salvadore Oxford, NP   5 months ago Viral respiratory illness   [place] [facility] [place], Salvadore Oxford, NP   5 months ago Acute nasopharyngitis   [place] [facility] [place], Salvadore Oxford, [place]

## 2023-04-21 ENCOUNTER — Other Ambulatory Visit: Payer: Self-pay | Admitting: Internal Medicine

## 2023-04-22 ENCOUNTER — Other Ambulatory Visit (HOSPITAL_COMMUNITY): Payer: Self-pay

## 2023-04-22 ENCOUNTER — Other Ambulatory Visit: Payer: Self-pay

## 2023-04-22 ENCOUNTER — Encounter: Payer: Self-pay | Admitting: Pharmacist

## 2023-04-22 MED ORDER — CITALOPRAM HYDROBROMIDE 40 MG PO TABS
40.0000 mg | ORAL_TABLET | Freq: Every day | ORAL | 0 refills | Status: DC
  Filled 2023-04-22: qty 90, 90d supply, fill #0

## 2023-04-22 NOTE — Telephone Encounter (Signed)
Requested Prescriptions  Pending Prescriptions Disp Refills   citalopram (CELEXA) 40 MG tablet 90 tablet 0    Sig: Take 1 tablet (40 mg total) by mouth daily.     Psychiatry:  Antidepressants - SSRI Passed - 04/21/2023  6:12 PM      Passed - Completed PHQ-2 or PHQ-9 in the last 360 days      Passed - Valid encounter within last 6 months    Recent Outpatient Visits           2 months ago Wheezing on expiration   [place] [facility] Mecum, [place] E, [place]   2 months ago Acute bacterial bronchitis   [facility] [place], Salvadore Oxford, NP   5 months ago Acute cough   [place] [facility] [place], Salvadore Oxford, NP   6 months ago Viral respiratory illness   [place] [facility] [place], Salvadore Oxford, NP   6 months ago Acute nasopharyngitis   [place] [facility] [place], Salvadore Oxford, [place]

## 2023-04-23 ENCOUNTER — Other Ambulatory Visit (HOSPITAL_COMMUNITY): Payer: Self-pay

## 2023-05-06 ENCOUNTER — Other Ambulatory Visit (HOSPITAL_COMMUNITY): Payer: Self-pay

## 2023-05-06 ENCOUNTER — Other Ambulatory Visit: Payer: Self-pay | Admitting: Internal Medicine

## 2023-05-06 ENCOUNTER — Encounter: Payer: Self-pay | Admitting: Internal Medicine

## 2023-05-06 ENCOUNTER — Other Ambulatory Visit: Payer: Self-pay

## 2023-05-07 ENCOUNTER — Other Ambulatory Visit (HOSPITAL_COMMUNITY): Payer: Self-pay

## 2023-05-07 ENCOUNTER — Other Ambulatory Visit: Payer: Self-pay

## 2023-05-07 MED ORDER — AMLODIPINE BESYLATE 5 MG PO TABS
5.0000 mg | ORAL_TABLET | Freq: Every day | ORAL | 0 refills | Status: DC
  Filled 2023-05-07: qty 90, 90d supply, fill #0

## 2023-05-07 MED ORDER — ROSUVASTATIN CALCIUM 5 MG PO TABS
5.0000 mg | ORAL_TABLET | Freq: Every day | ORAL | 1 refills | Status: DC
  Filled 2023-05-07: qty 90, 90d supply, fill #0
  Filled 2023-08-04: qty 90, 90d supply, fill #1

## 2023-05-07 MED ORDER — ALLOPURINOL 100 MG PO TABS
100.0000 mg | ORAL_TABLET | Freq: Every day | ORAL | 0 refills | Status: DC
  Filled 2023-05-07: qty 90, 90d supply, fill #0

## 2023-05-07 NOTE — Telephone Encounter (Signed)
Requested Prescriptions  Pending Prescriptions Disp Refills   rosuvastatin (CRESTOR) 5 MG tablet 90 tablet 1    Sig: Take 1 tablet (5 mg total) by mouth daily.     Cardiovascular:  Antilipid - Statins 2 Failed - 05/06/2023 12:27 PM      Failed - Lipid Panel in normal range within the last 12 months    Cholesterol  Date Value Ref Range Status  05/28/2022 277 (H) <200 mg/dL Final   LDL Cholesterol (Calc)  Date Value Ref Range Status  05/28/2022 186 (H) mg/dL (calc) Final    Comment:    Reference range: <100 . Desirable range <100 mg/dL for primary prevention;   <70 mg/dL for patients with CHD or diabetic patients  with > or = 2 CHD risk factors. Marland Kitchen LDL-C is now calculated using the Martin-Hopkins  calculation, which is a validated novel method providing  better accuracy than the Friedewald equation in the  estimation of LDL-C.  Horald Pollen et al. Lenox Ahr. 1610;960(45): 2061-2068  (http://education.[facility].com/faq/FAQ164)    Direct LDL  Date Value Ref Range Status  03/25/2016 116.0 mg/dL Final    Comment:    Optimal:  <100 mg/dLNear or Above Optimal:  100-129 mg/dLBorderline High:  130-159 mg/dLHigh:  160-189 mg/dLVery High:  >190 mg/dL   HDL  Date Value Ref Range Status  05/28/2022 47 (L) > OR = 50 mg/dL Final   Triglycerides  Date Value Ref Range Status  05/28/2022 239 (H) <150 mg/dL Final    Comment:    . If a non-fasting specimen was collected, consider repeat triglyceride testing on a fasting specimen if clinically indicated.  Perry Mount et al. J. of Clin. Lipidol. 2015;9:129-169. Marland Kitchen          Passed - Cr in normal range and within 360 days    Creat  Date Value Ref Range Status  10/29/2022 0.89 0.50 - 1.05 mg/dL Final         Passed - Patient is not pregnant      Passed - Valid encounter within last 12 months    Recent Outpatient Visits           3 months ago Wheezing on expiration   [place] [facility] Mecum, [place] E, [place]   3  months ago Acute bacterial bronchitis   [place] [facility] [place], Salvadore Oxford, NP   6 months ago Acute cough   [place] [place], Salvadore Oxford, NP   6 months ago Viral respiratory illness   [place] [facility] [place], Salvadore Oxford, NP   6 months ago Acute nasopharyngitis    [facility] [place], [place] W, NP               amLODipine (NORVASC) 5 MG tablet 90 tablet 0    Sig: Take 1 tablet (5 mg total) by mouth daily.     Cardiovascular: Calcium Channel Blockers 2 Passed - 05/06/2023 12:27 PM      Passed - Last BP in normal range    BP Readings from Last 1 Encounters:  01/27/23 126/66         Passed - Last Heart Rate in normal range    Pulse Readings from Last 1 Encounters:  01/27/23 75         Passed - Valid encounter within last 6 months    Recent Outpatient Visits           3  months ago Wheezing on expiration   [facility] M[facility]um, [place] E, [place]   3 months ago Acute bacterial bronchitis   [facility] [place], Salvadore Oxford, NP   6 months ago Acute cough   [facility] [place], Salvadore Oxford, NP   6 months ago Viral respiratory illness   [place] [facility] [place], Salvadore Oxford, NP   6 months ago Acute nasopharyngitis   [place] [facility] [place], [place], NP               allopurinol (ZYLOPRIM) 100 MG tablet 90 tablet 0    Sig: Take 1 tablet (100 mg total) by mouth daily.     Endocrinology:  Gout Agents - allopurinol Failed - 05/06/2023 12:27 PM      Failed - Uric Acid in normal range and within 360 days    Uric Acid, Serum  Date Value Ref Range Status  05/15/2021 8.7 (H) 2.5 - 7.0 mg/dL Final    Comment:    Therapeutic target for gout patients: <6.0 mg/dL .          Failed - CBC within normal limits and completed in the last 12 months    WBC  Date Value Ref Range Status   10/29/2022 13.5 (H) 3.8 - 10.8 Thousand/uL Final   RBC  Date Value Ref Range Status  10/29/2022 5.23 (H) 3.80 - 5.10 Million/uL Final   Hemoglobin  Date Value Ref Range Status  10/29/2022 15.0 11.7 - 15.5 g/dL Final   HCT  Date Value Ref Range Status  10/29/2022 44.7 35.0 - 45.0 % Final   MCHC  Date Value Ref Range Status  10/29/2022 33.6 32.0 - 36.0 g/dL Final   [facility]  Date Value Ref Range Status  10/29/2022 28.7 27.0 - 33.0 pg Final   MCV  Date Value Ref Range Status  10/29/2022 85.5 80.0 - 100.0 fL Final   No results found for: "PLTCOUNTKUC", "LABPLAT", "POCPLA" RDW  Date Value Ref Range Status  10/29/2022 13.5 11.0 - 15.0 % Final         Passed - Cr in normal range and within 360 days    Creat  Date Value Ref Range Status  10/29/2022 0.89 0.50 - 1.05 mg/dL Final         Passed - Valid encounter within last 12 months    Recent Outpatient Visits           3 months ago Wheezing on expiration   [facility] M[facility]um, [place] E, [place]   3 months ago Acute bacterial bronchitis   [place] [place], Salvadore Oxford, NP   6 months ago Acute cough   [place] [facility] [place], Salvadore Oxford, NP   6 months ago Viral respiratory illness   [facility], Salvadore Oxford, NP   6 months ago Acute nasopharyngitis    [facility] [place], Salvadore Oxford, [place]

## 2023-05-15 ENCOUNTER — Other Ambulatory Visit: Payer: Self-pay | Admitting: Oncology

## 2023-05-15 DIAGNOSIS — Z006 Encounter for examination for normal comparison and control in clinical research program: Secondary | ICD-10-CM

## 2023-06-23 ENCOUNTER — Other Ambulatory Visit: Payer: Self-pay | Admitting: Internal Medicine

## 2023-06-23 DIAGNOSIS — F32A Depression, unspecified: Secondary | ICD-10-CM

## 2023-06-23 DIAGNOSIS — I10 Essential (primary) hypertension: Secondary | ICD-10-CM

## 2023-06-24 ENCOUNTER — Other Ambulatory Visit (HOSPITAL_COMMUNITY): Payer: Self-pay

## 2023-06-24 ENCOUNTER — Other Ambulatory Visit: Payer: Self-pay

## 2023-06-24 MED ORDER — HYDROCHLOROTHIAZIDE 25 MG PO TABS
25.0000 mg | ORAL_TABLET | Freq: Every day | ORAL | 0 refills | Status: DC
  Filled 2023-06-24: qty 90, 90d supply, fill #0

## 2023-06-24 MED ORDER — TRAZODONE HCL 50 MG PO TABS
50.0000 mg | ORAL_TABLET | Freq: Every evening | ORAL | 0 refills | Status: DC
  Filled 2023-06-24: qty 90, 90d supply, fill #0

## 2023-06-24 NOTE — Telephone Encounter (Signed)
Requested Prescriptions  Pending Prescriptions Disp Refills   ALPRAZolam (XANAX) 0.25 MG tablet 20 tablet 0    Sig: Take 1 tablet (0.25 mg total) by mouth daily as needed for anxiety.     Not Delegated - Psychiatry: Anxiolytics/Hypnotics 2 Failed - 06/23/2023 12:12 PM      Failed - This refill cannot be delegated      Failed - Urine Drug Screen completed in last 360 days      Passed - Patient is not pregnant      Passed - Valid encounter within last 6 months    Recent Outpatient Visits           4 months ago Wheezing on expiration   [facility], [place] E, [place]   5 months ago Acute bacterial bronchitis   [place] [facility] [place], Salvadore Oxford, NP   7 months ago Acute cough   [place] [facility] [place], Salvadore Oxford, NP   8 months ago Viral respiratory illness   [facility] [place], Salvadore Oxford, NP   8 months ago Acute nasopharyngitis   [place] [place], [place] W, NP               traZODone (DESYREL) 50 MG tablet 90 tablet 0    Sig: Take 1 tablet (50 mg total) by mouth at bedtime.     Psychiatry: Antidepressants - Serotonin Modulator Passed - 06/23/2023 12:12 PM      Passed - Completed PHQ-2 or PHQ-9 in the last 360 days      Passed - Valid encounter within last 6 months    Recent Outpatient Visits           4 months ago Wheezing on expiration   [place] [facility], [place] E, [place]   5 months ago Acute bacterial bronchitis   [place] [facility] [place], Salvadore Oxford, NP   7 months ago Acute cough   [place] [facility] [place], Salvadore Oxford, NP   8 months ago Viral respiratory illness   [facility] [address] [place], Salvadore Oxford, NP   8 months ago Acute nasopharyngitis   [place] [facility] [place], [place], NP               hydrochlorothiazide (HYDRODIURIL) 25 MG  tablet 90 tablet 0    Sig: Take 1 tablet (25 mg total) by mouth daily.     Cardiovascular: Diuretics - Thiazide Failed - 06/23/2023 12:12 PM      Failed - Cr in normal range and within 180 days    Creat  Date Value Ref Range Status  10/29/2022 0.89 0.50 - 1.05 mg/dL Final         Failed - K in normal range and within 180 days    Potassium  Date Value Ref Range Status  10/29/2022 3.8 3.5 - 5.3 mmol/L Final         Failed - Na in normal range and within 180 days    Sodium  Date Value Ref Range Status  10/29/2022 139 135 - 146 mmol/L Final         Passed - Last BP in normal range    BP Readings from Last 1 Encounters:  01/27/23 126/66         Passed - Valid encounter within last 6 months  Recent Outpatient Visits           4 months ago Wheezing on expiration   [place] [facility] Mecum, [place] E, [place]   5 months ago Acute bacterial bronchitis   [place] [facility] [place], Salvadore Oxford, NP   7 months ago Acute cough   [place] [facility] [place], Salvadore Oxford, NP   8 months ago Viral respiratory illness   [place] [facility] [place], Salvadore Oxford, NP   8 months ago Acute nasopharyngitis   [place] [facility] [place], Salvadore Oxford, [place]

## 2023-06-24 NOTE — Telephone Encounter (Signed)
Requested medications are due for refill today.  yes  Requested medications are on the active medications list.  yes  Last refill. 04/04/2023 #20 0 rf  Future visit scheduled.   no  Notes to clinic.  Refill not delegated.    Requested Prescriptions  Pending Prescriptions Disp Refills   ALPRAZolam (XANAX) 0.25 MG tablet 20 tablet 0    Sig: Take 1 tablet (0.25 mg total) by mouth daily as needed for anxiety.     Not Delegated - Psychiatry: Anxiolytics/Hypnotics 2 Failed - 06/23/2023 12:12 PM      Failed - This refill cannot be delegated      Failed - Urine Drug Screen completed in last 360 days      Passed - Patient is not pregnant      Passed - Valid encounter within last 6 months    Recent Outpatient Visits           4 months ago Wheezing on expiration   [place] [facility] Mecum, [place] E, [place]   5 months ago Acute bacterial bronchitis   [place] [facility] [place], Salvadore Oxford, NP   7 months ago Acute cough   [place] [facility] [place], Salvadore Oxford, NP   8 months ago Viral respiratory illness   [facility] [place], Salvadore Oxford, NP   8 months ago Acute nasopharyngitis   [facility] [place], Salvadore Oxford, NP              Signed Prescriptions Disp Refills   traZODone (DESYREL) 50 MG tablet 90 tablet 0    Sig: Take 1 tablet (50 mg total) by mouth at bedtime.     Psychiatry: Antidepressants - Serotonin Modulator Passed - 06/23/2023 12:12 PM      Passed - Completed PHQ-2 or PHQ-9 in the last 360 days      Passed - Valid encounter within last 6 months    Recent Outpatient Visits           4 months ago Wheezing on expiration   [facility], [place] E, [place]   5 months ago Acute bacterial bronchitis   [place] [facility] [place], Salvadore Oxford, NP   7 months ago Acute cough   [facility] [place], Salvadore Oxford,  NP   8 months ago Viral respiratory illness   [place] [facility], Salvadore Oxford, NP   8 months ago Acute nasopharyngitis   [place] [facility] [place], [place], NP               hydrochlorothiazide (HYDRODIURIL) 25 MG tablet 90 tablet 0    Sig: Take 1 tablet (25 mg total) by mouth daily.     Cardiovascular: Diuretics - Thiazide Failed - 06/23/2023 12:12 PM      Failed - Cr in normal range and within 180 days    Creat  Date Value Ref Range Status  10/29/2022 0.89 0.50 - 1.05 mg/dL Final         Failed - K in normal range and within 180 days    Potassium  Date Value Ref Range Status  10/29/2022 3.8 3.5 - 5.3 mmol/L Final         Failed - Na in normal range and within 180 days    Sodium  Date Value Ref Range Status  10/29/2022 139 135 - 146 mmol/L Final         Passed - Last BP in normal range    BP Readings from Last 1 Encounters:  01/27/23 126/66         Passed - Valid encounter within last 6 months    Recent Outpatient Visits           4 months ago Wheezing on expiration   [facility], [place] E, [place]   5 months ago Acute bacterial bronchitis   [place] [place], Salvadore Oxford, NP   7 months ago Acute cough   [place] [facility] [place], Salvadore Oxford, NP   8 months ago Viral respiratory illness   [place] [place], Salvadore Oxford, NP   8 months ago Acute nasopharyngitis   [place] [facility] [place], Salvadore Oxford, [place]

## 2023-06-28 ENCOUNTER — Other Ambulatory Visit (HOSPITAL_COMMUNITY): Payer: Self-pay

## 2023-07-14 ENCOUNTER — Other Ambulatory Visit: Payer: Self-pay | Admitting: Internal Medicine

## 2023-07-14 DIAGNOSIS — F419 Anxiety disorder, unspecified: Secondary | ICD-10-CM

## 2023-07-15 NOTE — Telephone Encounter (Signed)
Requested medications are due for refill today.  yes  Requested medications are on the active medications list.  yes  Last refill. 04/04/2023 #20 0 rf  Future visit scheduled.   no  Notes to clinic.  Refill not delegated.    Requested Prescriptions  Pending Prescriptions Disp Refills   ALPRAZolam (XANAX) 0.25 MG tablet 20 tablet 0    Sig: Take 1 tablet (0.25 mg total) by mouth daily as needed for anxiety.     Not Delegated - Psychiatry: Anxiolytics/Hypnotics 2 Failed - 07/14/2023 12:47 PM      Failed - This refill cannot be delegated      Failed - Urine Drug Screen completed in last 360 days      Passed - Patient is not pregnant      Passed - Valid encounter within last 6 months    Recent Outpatient Visits           5 months ago Wheezing on expiration   [place] [facility] M[facility], [place] E, [place]   5 months ago Acute bacterial bronchitis   [facility] [place], Salvadore Oxford, NP   8 months ago Acute cough   [place] [facility] [place], Salvadore Oxford, NP   8 months ago Viral respiratory illness   [place] [place], Salvadore Oxford, NP   9 months ago Acute nasopharyngitis   [facility] [place], Salvadore Oxford, [place]

## 2023-07-16 ENCOUNTER — Other Ambulatory Visit (HOSPITAL_COMMUNITY): Payer: Self-pay

## 2023-07-16 MED ORDER — ALPRAZOLAM 0.25 MG PO TABS
0.2500 mg | ORAL_TABLET | Freq: Every day | ORAL | 0 refills | Status: DC | PRN
  Filled 2023-07-16: qty 20, 20d supply, fill #0

## 2023-07-17 ENCOUNTER — Other Ambulatory Visit: Payer: Self-pay

## 2023-07-20 ENCOUNTER — Other Ambulatory Visit: Payer: Self-pay | Admitting: Internal Medicine

## 2023-07-21 ENCOUNTER — Other Ambulatory Visit (HOSPITAL_COMMUNITY): Payer: Self-pay

## 2023-07-21 MED ORDER — CITALOPRAM HYDROBROMIDE 40 MG PO TABS
40.0000 mg | ORAL_TABLET | Freq: Every day | ORAL | 0 refills | Status: DC
  Filled 2023-07-21: qty 90, 90d supply, fill #0

## 2023-07-21 NOTE — Telephone Encounter (Signed)
Requested Prescriptions  Pending Prescriptions Disp Refills   citalopram (CELEXA) 40 MG tablet 90 tablet 0    Sig: Take 1 tablet (40 mg total) by mouth daily.     Psychiatry:  Antidepressants - SSRI Passed - 07/20/2023  6:28 PM      Passed - Completed PHQ-2 or PHQ-9 in the last 360 days      Passed - Valid encounter within last 6 months    Recent Outpatient Visits           5 months ago Wheezing on expiration   [place] [facility], [place] E, [place]   5 months ago Acute bacterial bronchitis   [place] [facility] [place], Salvadore Oxford, NP   8 months ago Acute cough   [facility] [place], Salvadore Oxford, NP   9 months ago Viral respiratory illness   [place] [facility], Salvadore Oxford, NP   9 months ago Acute nasopharyngitis   [place] [facility] [place], Salvadore Oxford, [place]

## 2023-07-22 ENCOUNTER — Other Ambulatory Visit: Payer: Self-pay

## 2023-08-04 ENCOUNTER — Other Ambulatory Visit (HOSPITAL_COMMUNITY): Payer: Self-pay

## 2023-08-04 ENCOUNTER — Other Ambulatory Visit: Payer: Self-pay | Admitting: Internal Medicine

## 2023-08-04 ENCOUNTER — Other Ambulatory Visit: Payer: Self-pay

## 2023-08-05 ENCOUNTER — Other Ambulatory Visit (HOSPITAL_COMMUNITY): Payer: Self-pay

## 2023-08-05 MED ORDER — AMLODIPINE BESYLATE 5 MG PO TABS
5.0000 mg | ORAL_TABLET | Freq: Every day | ORAL | 0 refills | Status: DC
  Filled 2023-08-05: qty 90, 90d supply, fill #0

## 2023-08-05 NOTE — Telephone Encounter (Signed)
Requested Prescriptions  Pending Prescriptions Disp Refills   amLODipine (NORVASC) 5 MG tablet 90 tablet 0    Sig: Take 1 tablet (5 mg total) by mouth daily.     Cardiovascular: Calcium Channel Blockers 2 Failed - 08/04/2023  9:14 AM      Failed - Valid encounter within last 6 months    Recent Outpatient Visits           6 months ago Wheezing on expiration   [place] M[facility]um, [place] E, [place]   6 months ago Acute bacterial bronchitis   [place] [facility] [place], Salvadore Oxford, NP   9 months ago Acute cough   [place] [facility] [place], Salvadore Oxford, NP   9 months ago Viral respiratory illness   [place] [facility] [place], Salvadore Oxford, NP   9 months ago Acute nasopharyngitis   [place] [facility] [place], [place] W, NP              Passed - Last BP in normal range    BP Readings from Last 1 Encounters:  01/27/23 126/66         Passed - Last Heart Rate in normal range    Pulse Readings from Last 1 Encounters:  01/27/23 75          allopurinol (ZYLOPRIM) 100 MG tablet 90 tablet 0    Sig: Take 1 tablet (100 mg total) by mouth daily.     Endocrinology:  Gout Agents - allopurinol Failed - 08/04/2023  9:14 AM      Failed - Uric Acid in normal range and within 360 days    Uric Acid, Serum  Date Value Ref Range Status  05/15/2021 8.7 (H) 2.5 - 7.0 mg/dL Final    Comment:    Therapeutic target for gout patients: <6.0 mg/dL .          Failed - CBC within normal limits and completed in the last 12 months    WBC  Date Value Ref Range Status  10/29/2022 13.5 (H) 3.8 - 10.8 Thousand/uL Final   RBC  Date Value Ref Range Status  10/29/2022 5.23 (H) 3.80 - 5.10 Million/uL Final   Hemoglobin  Date Value Ref Range Status  10/29/2022 15.0 11.7 - 15.5 g/dL Final   HCT  Date Value Ref Range Status  10/29/2022 44.7 35.0 - 45.0 % Final   MCHC  Date Value Ref Range Status  10/29/2022  33.6 32.0 - 36.0 g/dL Final   [facility]  Date Value Ref Range Status  10/29/2022 28.7 27.0 - 33.0 pg Final   MCV  Date Value Ref Range Status  10/29/2022 85.5 80.0 - 100.0 fL Final   No results found for: "PLTCOUNTKUC", "LABPLAT", "POCPLA" RDW  Date Value Ref Range Status  10/29/2022 13.5 11.0 - 15.0 % Final         Passed - Cr in normal range and within 360 days    Creat  Date Value Ref Range Status  10/29/2022 0.89 0.50 - 1.05 mg/dL Final         Passed - Valid encounter within last 12 months    Recent Outpatient Visits           6 months ago Wheezing on expiration   [place] [facility] Mecum, [place] E, [place]   6 months ago Acute bacterial bronchitis   [place] [facility] [place],  Salvadore Oxford, NP   9 months ago Acute cough   [place] [facility] [place], Salvadore Oxford, NP   9 months ago Viral respiratory illness   [place] [facility] [place], Salvadore Oxford, NP   9 months ago Acute nasopharyngitis   [place] [facility] [place], Salvadore Oxford, [place]

## 2023-08-05 NOTE — Telephone Encounter (Signed)
Requested medication (s) are due for refill today: Yes  Requested medication (s) are on the active medication list: Yes  Last refill:  05/07/23  Future visit scheduled: No  Notes to clinic:  Unable to refill per protocol due to failed labs, no updated results.      Requested Prescriptions  Pending Prescriptions Disp Refills   allopurinol (ZYLOPRIM) 100 MG tablet 90 tablet 0    Sig: Take 1 tablet (100 mg total) by mouth daily.     Endocrinology:  Gout Agents - allopurinol Failed - 08/04/2023  9:14 AM      Failed - Uric Acid in normal range and within 360 days    Uric Acid, Serum  Date Value Ref Range Status  05/15/2021 8.7 (H) 2.5 - 7.0 mg/dL Final    Comment:    Therapeutic target for gout patients: <6.0 mg/dL .          Failed - CBC within normal limits and completed in the last 12 months    WBC  Date Value Ref Range Status  10/29/2022 13.5 (H) 3.8 - 10.8 Thousand/uL Final   RBC  Date Value Ref Range Status  10/29/2022 5.23 (H) 3.80 - 5.10 Million/uL Final   Hemoglobin  Date Value Ref Range Status  10/29/2022 15.0 11.7 - 15.5 g/dL Final   HCT  Date Value Ref Range Status  10/29/2022 44.7 35.0 - 45.0 % Final   MCHC  Date Value Ref Range Status  10/29/2022 33.6 32.0 - 36.0 g/dL Final   [facility]  Date Value Ref Range Status  10/29/2022 28.7 27.0 - 33.0 pg Final   MCV  Date Value Ref Range Status  10/29/2022 85.5 80.0 - 100.0 fL Final   No results found for: "PLTCOUNTKUC", "LABPLAT", "POCPLA" RDW  Date Value Ref Range Status  10/29/2022 13.5 11.0 - 15.0 % Final         Passed - Cr in normal range and within 360 days    Creat  Date Value Ref Range Status  10/29/2022 0.89 0.50 - 1.05 mg/dL Final         Passed - Valid encounter within last 12 months    Recent Outpatient Visits           6 months ago Wheezing on expiration   [place] [facility] Mecum, [place] E, PA-C   6 months ago Acute bacterial bronchitis   [place] [place], Salvadore Oxford, NP   9 months ago Acute cough   [place] [facility], Salvadore Oxford, NP   9 months ago Viral respiratory illness   [place] [facility] [place], Salvadore Oxford, NP   9 months ago Acute nasopharyngitis   [place] [facility] [place], Salvadore Oxford, NP              Signed Prescriptions Disp Refills   amLODipine (NORVASC) 5 MG tablet 90 tablet 0    Sig: Take 1 tablet (5 mg total) by mouth daily.     Cardiovascular: Calcium Channel Blockers 2 Failed - 08/04/2023  9:14 AM      Failed - Valid encounter within last 6 months    Recent Outpatient Visits           6 months ago Wheezing on expiration   [place] [facility] Mecum, [place] E, [place]   6 months ago Acute bacterial bronchitis   [place] [facility] [place], Salvadore Oxford, NP   9 months ago Acute cough   [place] [facility] [place], Salvadore Oxford, NP   9 months ago Viral respiratory illness   [place] [facility] [place], Salvadore Oxford, NP   9 months ago Acute nasopharyngitis   [place] [facility] [place], [place] W, [place]              Passed - Last BP in normal range    BP Readings from Last 1 Encounters:  01/27/23 126/66         Passed - Last Heart Rate in normal range    Pulse Readings from Last 1 Encounters:  01/27/23 75

## 2023-08-06 ENCOUNTER — Other Ambulatory Visit (HOSPITAL_COMMUNITY): Payer: Self-pay

## 2023-08-06 MED ORDER — ALLOPURINOL 100 MG PO TABS
100.0000 mg | ORAL_TABLET | Freq: Every day | ORAL | 0 refills | Status: DC
  Filled 2023-08-06: qty 90, 90d supply, fill #0

## 2023-09-18 ENCOUNTER — Other Ambulatory Visit: Payer: Self-pay | Admitting: Internal Medicine

## 2023-09-18 DIAGNOSIS — I10 Essential (primary) hypertension: Secondary | ICD-10-CM

## 2023-09-18 DIAGNOSIS — F32A Depression, unspecified: Secondary | ICD-10-CM

## 2023-09-19 ENCOUNTER — Other Ambulatory Visit (HOSPITAL_COMMUNITY): Payer: Self-pay

## 2023-09-19 ENCOUNTER — Other Ambulatory Visit: Payer: Self-pay

## 2023-09-19 MED ORDER — ALPRAZOLAM 0.25 MG PO TABS
0.2500 mg | ORAL_TABLET | Freq: Every day | ORAL | 0 refills | Status: DC | PRN
  Filled 2023-09-19: qty 20, 20d supply, fill #0

## 2023-09-19 MED ORDER — TRAZODONE HCL 50 MG PO TABS
50.0000 mg | ORAL_TABLET | Freq: Every evening | ORAL | 0 refills | Status: DC
  Filled 2023-09-19: qty 90, 90d supply, fill #0

## 2023-09-19 MED ORDER — HYDROCHLOROTHIAZIDE 25 MG PO TABS
25.0000 mg | ORAL_TABLET | Freq: Every day | ORAL | 0 refills | Status: DC
  Filled 2023-09-19: qty 90, 90d supply, fill #0

## 2023-09-19 NOTE — Telephone Encounter (Signed)
Requested medication (s) are due for refill today:   Provider to review Xanax, other 2 are due refills  Requested medication (s) are on the active medication list:   Yes for all 3  Future visit scheduled:   No   Last ordered: Trazodone and Hydrochlorothiazide 06/24/2023 #90, 0 refills each;   Xanax 07/16/2023 #20, 0 refills  Returned because non delegated refill and labs are due per protocol.     Requested Prescriptions  Pending Prescriptions Disp Refills   traZODone (DESYREL) 50 MG tablet 90 tablet 0    Sig: Take 1 tablet (50 mg total) by mouth at bedtime.     Psychiatry: Antidepressants - Serotonin Modulator Failed - 09/18/2023  7:41 PM      Failed - Valid encounter within last 6 months    Recent Outpatient Visits           7 months ago Wheezing on expiration   [place] [facility] M[place]um, [place], [place]   7 months ago Acute bacterial bronchitis   [place] [facility] [place], Salvadore Oxford, NP   10 months ago Acute cough   [place] [facility] [place], Salvadore Oxford, NP   11 months ago Viral respiratory illness   [facility] [place], Salvadore Oxford, NP   11 months ago Acute nasopharyngitis   [place] [facility] [place], Salvadore Oxford, [place]              Passed - Completed PHQ-2 or PHQ-9 in the last 360 days       hydrochlorothiazide (HYDRODIURIL) 25 MG tablet 90 tablet 0    Sig: Take 1 tablet (25 mg total) by mouth daily.     Cardiovascular: Diuretics - Thiazide Failed - 09/18/2023  7:41 PM      Failed - Cr in normal range and within 180 days    Creat  Date Value Ref Range Status  10/29/2022 0.89 0.50 - 1.05 mg/dL Final         Failed - K in normal range and within 180 days    Potassium  Date Value Ref Range Status  10/29/2022 3.8 3.5 - 5.3 mmol/L Final         Failed - Na in normal range and within 180 days    Sodium  Date Value Ref Range Status  10/29/2022 139 135 - 146 mmol/L Final          Failed - Valid encounter within last 6 months    Recent Outpatient Visits           7 months ago Wheezing on expiration   [place] [facility] [place], [place], [place]   7 months ago Acute bacterial bronchitis   [place] [facility] [place], Salvadore Oxford, NP   10 months ago Acute cough   [place] [facility] [place], Salvadore Oxford, NP   11 months ago Viral respiratory illness   [place] [facility] [place], Salvadore Oxford, NP   11 months ago Acute nasopharyngitis   [place] [facility] [place], [place] W, NP              Passed - Last BP in normal range    BP Readings from Last 1 Encounters:  01/27/23 126/66          ALPRAZolam (XANAX) 0.25 MG tablet 20 tablet 0    Sig:  Take 1 tablet (0.25 mg total) by mouth daily as needed for anxiety.     Not Delegated - Psychiatry: Anxiolytics/Hypnotics 2 Failed - 09/18/2023  7:41 PM      Failed - This refill cannot be delegated      Failed - Urine Drug Screen completed in last 360 days      Failed - Valid encounter within last 6 months    Recent Outpatient Visits           7 months ago Wheezing on expiration   [place] [facility], [place] E, [place]   7 months ago Acute bacterial bronchitis   [place] [facility] [place], Salvadore Oxford, NP   10 months ago Acute cough   [place] [facility] [place], Salvadore Oxford, NP   11 months ago Viral respiratory illness   [place] [facility] [place], Salvadore Oxford, NP   11 months ago Acute nasopharyngitis    [facility] [place], Salvadore Oxford, [place]              Passed - Patient is not pregnant

## 2023-09-22 ENCOUNTER — Other Ambulatory Visit: Payer: Self-pay

## 2023-09-22 MED ORDER — FLULAVAL 0.5 ML IM SUSY
0.5000 mL | PREFILLED_SYRINGE | Freq: Once | INTRAMUSCULAR | 0 refills | Status: AC
  Filled 2023-09-22: qty 0.5, 1d supply, fill #0

## 2023-10-08 ENCOUNTER — Other Ambulatory Visit: Payer: Self-pay | Admitting: Internal Medicine

## 2023-10-08 DIAGNOSIS — M109 Gout, unspecified: Secondary | ICD-10-CM

## 2023-10-10 ENCOUNTER — Other Ambulatory Visit: Payer: Self-pay

## 2023-10-10 MED ORDER — COLCHICINE 0.6 MG PO TABS
ORAL_TABLET | ORAL | 1 refills | Status: AC
  Filled 2023-10-10: qty 90, 45d supply, fill #0

## 2023-10-10 NOTE — Telephone Encounter (Signed)
Requested medication (s) are due for refill today: Yes  Requested medication (s) are on the active medication list: Yes  Last refill:  08/21/22  Future visit scheduled: No  Notes to clinic:  Prescription has expired.    Requested Prescriptions  Pending Prescriptions Disp Refills   colchicine 0.6 MG tablet 90 tablet 1    Sig: TAKE 1 TABLET BY MOUTH 2 TIMES DAILY FOR 7 - 10 DAYS THEN TAKE AS DIRECTED AS NEEDED FOR ATTACKS.     Endocrinology:  Gout Agents - colchicine Failed - 10/08/2023  2:56 PM      Failed - CBC within normal limits and completed in the last 12 months    WBC  Date Value Ref Range Status  10/29/2022 13.5 (H) 3.8 - 10.8 Thousand/uL Final   RBC  Date Value Ref Range Status  10/29/2022 5.23 (H) 3.80 - 5.10 Million/uL Final   Hemoglobin  Date Value Ref Range Status  10/29/2022 15.0 11.7 - 15.5 g/dL Final   HCT  Date Value Ref Range Status  10/29/2022 44.7 35.0 - 45.0 % Final   MCHC  Date Value Ref Range Status  10/29/2022 33.6 32.0 - 36.0 g/dL Final   [facility]  Date Value Ref Range Status  10/29/2022 28.7 27.0 - 33.0 pg Final   MCV  Date Value Ref Range Status  10/29/2022 85.5 80.0 - 100.0 fL Final   No results found for: "PLTCOUNTKUC", "LABPLAT", "POCPLA" RDW  Date Value Ref Range Status  10/29/2022 13.5 11.0 - 15.0 % Final         Passed - Cr in normal range and within 360 days    Creat  Date Value Ref Range Status  10/29/2022 0.89 0.50 - 1.05 mg/dL Final         Passed - ALT in normal range and within 360 days    ALT  Date Value Ref Range Status  10/29/2022 13 6 - 29 U/L Final         Passed - AST in normal range and within 360 days    AST  Date Value Ref Range Status  10/29/2022 16 10 - 35 U/L Final         Passed - Valid encounter within last 12 months    Recent Outpatient Visits           8 months ago Wheezing on expiration   [place] [facility] Mecum, [place], [place]   8 months ago Acute bacterial bronchitis    [place] [facility] [place], Salvadore Oxford, NP   11 months ago Acute cough   [place] [place] Munroe, NP   11 months ago Viral respiratory illness   [place] [facility] [place], Salvadore Oxford, NP   12 months ago Acute nasopharyngitis    [facility] [place], Salvadore Oxford, [place]

## 2023-10-13 ENCOUNTER — Other Ambulatory Visit: Payer: Self-pay | Admitting: Internal Medicine

## 2023-10-13 DIAGNOSIS — F32A Depression, unspecified: Secondary | ICD-10-CM

## 2023-10-14 ENCOUNTER — Other Ambulatory Visit: Payer: Self-pay

## 2023-10-14 ENCOUNTER — Other Ambulatory Visit (HOSPITAL_COMMUNITY): Payer: Self-pay

## 2023-10-14 MED ORDER — ALPRAZOLAM 0.25 MG PO TABS
0.2500 mg | ORAL_TABLET | Freq: Every day | ORAL | 0 refills | Status: DC | PRN
  Filled 2023-10-14 – 2023-10-31 (×2): qty 20, 20d supply, fill #0

## 2023-10-14 MED ORDER — CITALOPRAM HYDROBROMIDE 40 MG PO TABS
40.0000 mg | ORAL_TABLET | Freq: Every day | ORAL | 0 refills | Status: DC
  Filled 2023-10-14: qty 90, 90d supply, fill #0

## 2023-10-14 NOTE — Telephone Encounter (Signed)
Requested medication (s) are due for refill today: Yes  Requested medication (s) are on the active medication list: Yes  Last refill:    Future visit scheduled: No  Notes to clinic:  Left message for pt. To make appointment. Xanax not delegated.    Requested Prescriptions  Pending Prescriptions Disp Refills   citalopram (CELEXA) 40 MG tablet 90 tablet 0    Sig: Take 1 tablet (40 mg total) by mouth daily.     Psychiatry:  Antidepressants - SSRI Failed - 10/13/2023 12:05 PM      Failed - Valid encounter within last 6 months    Recent Outpatient Visits           8 months ago Wheezing on expiration   [place] [facility] M[place]um, [place], [place]   8 months ago Acute bacterial bronchitis   [place] [facility] [place], Salvadore Oxford, NP   11 months ago Acute cough   [place] [facility] [place], Salvadore Oxford, NP   11 months ago Viral respiratory illness   [place] [facility] [place], [place] W, NP   1 year ago Acute nasopharyngitis   [place] [facility] [place], Salvadore Oxford, [place]              Passed - Completed PHQ-2 or PHQ-9 in the last 360 days       ALPRAZolam (XANAX) 0.25 MG tablet 20 tablet 0    Sig: Take 1 tablet (0.25 mg total) by mouth daily as needed for anxiety.     Not Delegated - Psychiatry: Anxiolytics/Hypnotics 2 Failed - 10/13/2023 12:05 PM      Failed - This refill cannot be delegated      Failed - Urine Drug Screen completed in last 360 days      Failed - Valid encounter within last 6 months    Recent Outpatient Visits           8 months ago Wheezing on expiration   [place] [facility], [place], [place]   8 months ago Acute bacterial bronchitis   [facility] [place], Salvadore Oxford, NP   11 months ago Acute cough   [place] [facility] [place], Salvadore Oxford, NP   11 months ago Viral respiratory illness   [place] [facility] [place], Salvadore Oxford, NP   1 year ago Acute nasopharyngitis    [place] [facility] [place], Salvadore Oxford, [place]              Passed - Patient is not pregnant

## 2023-10-16 ENCOUNTER — Other Ambulatory Visit (HOSPITAL_COMMUNITY): Payer: Self-pay

## 2023-10-19 NOTE — Progress Notes (Unsigned)
    Regina Huber T. Regina Aufiero, MD, CAQ Sports Medicine [facility] [address] [place], [address]  Phone: [phone]  FAX: [phone]  Regina Huber - 62 y.o. female  MRN [id number]  Date of Birth: Feb 20, 1961  Date: 10/20/2023  PCP: Regina Munroe, NP  Referral: Regina Munroe, NP  No chief complaint on file.  Subjective:   Regina Huber is a 62 y.o. very pleasant female patient with There is no height or weight on file to calculate BMI. who presents with the following:  She is a very pleasant patient who presents with some ongoing bilateral knee pain.  I did see her once in 2020 for some right-sided knee osteoarthritis.  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    Review of Systems is noted in the HPI, as appropriate  Objective:   There were no vitals taken for this visit.  GEN: No acute distress; alert,appropriate. PULM: Breathing comfortably in no respiratory distress PSYCH: Normally interactive.   Laboratory and Imaging Data:  Assessment and Plan:   ***

## 2023-10-20 ENCOUNTER — Ambulatory Visit (INDEPENDENT_AMBULATORY_CARE_PROVIDER_SITE_OTHER): Payer: 59 | Admitting: Family Medicine

## 2023-10-20 ENCOUNTER — Encounter: Payer: Self-pay | Admitting: Family Medicine

## 2023-10-20 VITALS — BP 122/64 | HR 68 | Temp 97.4°F | Ht 68.0 in | Wt 196.8 lb

## 2023-10-20 DIAGNOSIS — M170 Bilateral primary osteoarthritis of knee: Secondary | ICD-10-CM

## 2023-10-20 DIAGNOSIS — G609 Hereditary and idiopathic neuropathy, unspecified: Secondary | ICD-10-CM | POA: Diagnosis not present

## 2023-10-20 DIAGNOSIS — M10071 Idiopathic gout, right ankle and foot: Secondary | ICD-10-CM

## 2023-10-20 MED ORDER — TRIAMCINOLONE ACETONIDE 40 MG/ML IJ SUSP
40.0000 mg | Freq: Once | INTRAMUSCULAR | Status: AC
  Administered 2023-10-20: 40 mg via INTRA_ARTICULAR

## 2023-10-31 ENCOUNTER — Other Ambulatory Visit: Payer: Self-pay

## 2023-10-31 ENCOUNTER — Other Ambulatory Visit (HOSPITAL_COMMUNITY): Payer: Self-pay

## 2023-10-31 ENCOUNTER — Other Ambulatory Visit: Payer: Self-pay | Admitting: Internal Medicine

## 2023-11-03 NOTE — Telephone Encounter (Signed)
Requested medication (s) are due for refill today: Yes  Requested medication (s) are on the active medication list: Yes  Last refill:  08/05/23  Future visit scheduled: No  Notes to clinic:  Pt. Needs OV.    Requested Prescriptions  Pending Prescriptions Disp Refills   rosuvastatin (CRESTOR) 5 MG tablet 90 tablet 1    Sig: Take 1 tablet (5 mg total) by mouth daily.     Cardiovascular:  Antilipid - Statins 2 Failed - 10/31/2023 12:07 PM      Failed - Cr in normal range and within 360 days    Creat  Date Value Ref Range Status  10/29/2022 0.89 0.50 - 1.05 mg/dL Final         Failed - Lipid Panel in normal range within the last 12 months    Cholesterol  Date Value Ref Range Status  05/28/2022 277 (H) <200 mg/dL Final   LDL Cholesterol (Calc)  Date Value Ref Range Status  05/28/2022 186 (H) mg/dL (calc) Final    Comment:    Reference range: <100 . Desirable range <100 mg/dL for primary prevention;   <70 mg/dL for patients with CHD or diabetic patients  with > or = 2 CHD risk factors. Marland Kitchen LDL-C is now calculated using the Martin-Hopkins  calculation, which is a validated novel method providing  better accuracy than the Friedewald equation in the  estimation of LDL-C.  Horald Pollen et al. Lenox Ahr. 7616;073(71): 2061-2068  (http://education.QuestDiagnostics.com/faq/FAQ164)    Direct LDL  Date Value Ref Range Status  03/25/2016 116.0 mg/dL Final    Comment:    Optimal:  <100 mg/dLNear or Above Optimal:  100-129 mg/dLBorderline High:  130-159 mg/dLHigh:  160-189 mg/dLVery High:  >190 mg/dL   HDL  Date Value Ref Range Status  05/28/2022 47 (L) > OR = 50 mg/dL Final   Triglycerides  Date Value Ref Range Status  05/28/2022 239 (H) <150 mg/dL Final    Comment:    . If a non-fasting specimen was collected, consider repeat triglyceride testing on a fasting specimen if clinically indicated.  Perry Mount et al. J. of Clin. Lipidol. 2015;9:129-169. Marland Kitchen          Passed -  Patient is not pregnant      Passed - Valid encounter within last 12 months    Recent Outpatient Visits           9 months ago Wheezing on expiration   [place] [facility] Mecum, [place] E, [place]   9 months ago Acute bacterial bronchitis   [place] [facility] [place], Salvadore Oxford, NP   1 year ago Acute cough   [place] [facility] [place], Salvadore Oxford, NP   1 year ago Viral respiratory illness   [place] [facility] [place], Salvadore Oxford, NP   1 year ago Acute nasopharyngitis   [place] [facility] [place], [place], NP               amLODipine (NORVASC) 5 MG tablet 90 tablet 0    Sig: Take 1 tablet (5 mg total) by mouth daily.     Cardiovascular: Calcium Channel Blockers 2 Failed - 10/31/2023 12:07 PM      Failed - Valid encounter within last 6 months    Recent Outpatient Visits           9 months ago Wheezing on expiration    [facility],  Oswaldo Conroy, PA-C   9 months ago Acute bacterial bronchitis   [facility] [place], Salvadore Oxford, NP   1 year ago Acute cough   [place] [facility] [place], Salvadore Oxford, NP   1 year ago Viral respiratory illness   [place] [facility] [place], Salvadore Oxford, NP   1 year ago Acute nasopharyngitis   [place] [facility] [place], [place], [place]              Passed - Last BP in normal range    BP Readings from Last 1 Encounters:  10/20/23 122/64         Passed - Last Heart Rate in normal range    Pulse Readings from Last 1 Encounters:  10/20/23 68          allopurinol (ZYLOPRIM) 100 MG tablet 90 tablet 0    Sig: Take 1 tablet (100 mg total) by mouth daily.     Endocrinology:  Gout Agents - allopurinol Failed - 10/31/2023 12:07 PM      Failed - Uric Acid in normal range and within 360 days    Uric Acid, Serum  Date Value Ref Range Status  05/15/2021 8.7  (H) 2.5 - 7.0 mg/dL Final    Comment:    Therapeutic target for gout patients: <6.0 mg/dL .          Failed - Cr in normal range and within 360 days    Creat  Date Value Ref Range Status  10/29/2022 0.89 0.50 - 1.05 mg/dL Final         Failed - CBC within normal limits and completed in the last 12 months    WBC  Date Value Ref Range Status  10/29/2022 13.5 (H) 3.8 - 10.8 Thousand/uL Final   RBC  Date Value Ref Range Status  10/29/2022 5.23 (H) 3.80 - 5.10 Million/uL Final   Hemoglobin  Date Value Ref Range Status  10/29/2022 15.0 11.7 - 15.5 g/dL Final   HCT  Date Value Ref Range Status  10/29/2022 44.7 35.0 - 45.0 % Final   MCHC  Date Value Ref Range Status  10/29/2022 33.6 32.0 - 36.0 g/dL Final   [facility]  Date Value Ref Range Status  10/29/2022 28.7 27.0 - 33.0 pg Final   MCV  Date Value Ref Range Status  10/29/2022 85.5 80.0 - 100.0 fL Final   No results found for: "PLTCOUNTKUC", "LABPLAT", "POCPLA" RDW  Date Value Ref Range Status  10/29/2022 13.5 11.0 - 15.0 % Final         Passed - Valid encounter within last 12 months    Recent Outpatient Visits           9 months ago Wheezing on expiration   [place] [facility], [place], [place]   9 months ago Acute bacterial bronchitis   [place] [facility] [place], Salvadore Oxford, NP   1 year ago Acute cough   [place] [facility] [place], Salvadore Oxford, NP   1 year ago Viral respiratory illness   [place] [facility] [place], Salvadore Oxford, NP   1 year ago Acute nasopharyngitis   [place] [facility] [place], Salvadore [place], [place]

## 2023-11-05 ENCOUNTER — Other Ambulatory Visit (HOSPITAL_COMMUNITY): Payer: Self-pay

## 2023-12-11 ENCOUNTER — Other Ambulatory Visit: Payer: Self-pay | Admitting: Internal Medicine

## 2023-12-11 DIAGNOSIS — F419 Anxiety disorder, unspecified: Secondary | ICD-10-CM

## 2023-12-11 DIAGNOSIS — I10 Essential (primary) hypertension: Secondary | ICD-10-CM

## 2023-12-15 ENCOUNTER — Telehealth: Payer: Self-pay | Admitting: Internal Medicine

## 2023-12-15 NOTE — Telephone Encounter (Signed)
Medication Refill -  Most Recent Primary Care Visit:  Provider: Hannah Beat  Department: [facility]  Visit Type: OFFICE VISIT  Date: 10/20/2023  Medication:  Rosuvastatin Calcium 5 mg Oral Daily  amLODIPine Besylate 5 mg Oral Daily    Allopurinol 100 mg Oral Daily    traZODone HCl 50 mg Oral Nightly    hydroCHLOROthiazide 25 mg Oral Daily    Citalopram Hydrobromide 40 mg Oral Daily   ALPRAZolam 0.25 MG Take 1 tablet (0.25 mg   Has the patient contacted their pharmacy? Yes (Agent: If no, request that the patient contact the pharmacy for the refill. If patient does not wish to contact the pharmacy document the reason why and proceed with request.) (Agent: If yes, when and what did the pharmacy advise?)  Is this the correct pharmacy for this prescription? Yes If no, delete pharmacy and type the correct one.  This is the patient's preferred pharmacy:  [facility] [address] [place] [address] Phone: [phone] Fax: [phone]   Has the prescription been filled recently? Yes  Is the patient out of the medication? Yes  Has the patient been seen for an appointment in the last year OR does the patient have an upcoming appointment? Yes  Can we respond through MyChart? Yes  Agent: Please be advised that Rx refills may take up to 3 business days. We ask that you follow-up with your pharmacy.

## 2023-12-16 NOTE — Telephone Encounter (Signed)
 Requested medication (s) are due for refill today: yes  Requested medication (s) are on the active medication list: yes  Last refill:  Rosuvastatin : 05/07/23 #90 1 RF Amlodipine : 08/05/23 #90 Allopurinol : 08/06/23 #90 Trazodone : 09/19/23 #90 Hydrochlorothiazide : 09/19/23 #90 Alprazolam : 10/14/23 #20 Citalopram : 10/14/23  Future visit scheduled: yes  Notes to clinic:  overdue OV, Alprazolam : not delegated to NT to RF; overdue lab work   Requested Prescriptions  Pending Prescriptions Disp Refills   rosuvastatin  (CRESTOR ) 5 MG tablet 90 tablet 1    Sig: Take 1 tablet (5 mg total) by mouth daily.     Cardiovascular:  Antilipid - Statins 2 Failed - 12/16/2023  2:25 PM      Failed - Cr in normal range and within 360 days    Creat  Date Value Ref Range Status  10/29/2022 0.89 0.50 - 1.05 mg/dL Final         Failed - Lipid Panel in normal range within the last 12 months    Cholesterol  Date Value Ref Range Status  05/28/2022 277 (H) <200 mg/dL Final   LDL Cholesterol (Calc)  Date Value Ref Range Status  05/28/2022 186 (H) mg/dL (calc) Final    Comment:    Reference range: <100 . Desirable range <100 mg/dL for primary prevention;   <70 mg/dL for patients with CHD or diabetic patients  with > or = 2 CHD risk factors. [facility] LDL-C is now calculated using the Martin-Hopkins  calculation, which is a validated novel method providing  better accuracy than the Friedewald equation in the  estimation of LDL-C.  Gladis APPLETHWAITE et al. SANDREA. 7986;689(80): 2061-2068  (http://education.[facility].com/faq/FAQ164)    Direct LDL  Date Value Ref Range Status  03/25/2016 116.0 mg/dL Final    Comment:    Optimal:  <100 mg/dLNear or Above Optimal:  100-129 mg/dLBorderline High:  130-159 mg/dLHigh:  160-189 mg/dLVery High:  >190 mg/dL   HDL  Date Value Ref Range Status  05/28/2022 47 (L) > OR = 50 mg/dL Final   Triglycerides  Date Value Ref Range Status  05/28/2022 239 (H) <150 mg/dL Final     Comment:    . If a non-fasting specimen was collected, consider repeat triglyceride testing on a fasting specimen if clinically indicated.  Veatrice et al. J. of Clin. Lipidol. 2015;9:129-169. SABRA          Passed - Patient is not pregnant      Passed - Valid encounter within last 12 months    Recent Outpatient Visits           10 months ago Wheezing on expiration   [place] [facility] [place]ecum, [place] E, [place]   10 months ago Acute bacterial bronchitis   [place] [facility] [place], Angeline ORN, NP   1 year ago Acute cough   [place] [facility] [place], Angeline ORN, NP   1 year ago Viral respiratory illness   [facility] [place], Angeline ORN, NP   1 year ago Acute nasopharyngitis   [place] [facility] [place], Angeline ORN, NP       Future Appointments             In 1 week Antonette Angeline ORN, NP [place] [facility]             amLODipine  (NORVASC ) 5 MG tablet 90 tablet 0    Sig: Take 1 tablet (5 mg  total) by mouth daily.     Cardiovascular: Calcium  Channel Blockers 2 Failed - 12/16/2023  2:25 PM      Failed - Valid encounter within last 6 months    Recent Outpatient Visits           10 months ago Wheezing on expiration   [place] [facility] Mecum, [place] E, [place]   10 months ago Acute bacterial bronchitis   [facility] [place], Angeline ORN, NP   1 year ago Acute cough   [place] [facility] [place], Angeline ORN, NP   1 year ago Viral respiratory illness   [facility] [place], Angeline ORN, NP   1 year ago Acute nasopharyngitis   [place] [facility] [place], Angeline ORN, NP       Future Appointments             In 1 week Antonette, Angeline ORN, NP [place] [facility]            Passed - Last BP in normal range    BP Readings from Last 1  Encounters:  10/20/23 122/64         Passed - Last Heart Rate in normal range    Pulse Readings from Last 1 Encounters:  10/20/23 68          allopurinol  (ZYLOPRIM ) 100 MG tablet 90 tablet 0    Sig: Take 1 tablet (100 mg total) by mouth daily.     Endocrinology:  Gout Agents - allopurinol  Failed - 12/16/2023  2:25 PM      Failed - Uric Acid in normal range and within 360 days    Uric Acid, Serum  Date Value Ref Range Status  05/15/2021 8.7 (H) 2.5 - 7.0 mg/dL Final    Comment:    Therapeutic target for gout patients: <6.0 mg/dL .          Failed - Cr in normal range and within 360 days    Creat  Date Value Ref Range Status  10/29/2022 0.89 0.50 - 1.05 mg/dL Final         Failed - CBC within normal limits and completed in the last 12 months    WBC  Date Value Ref Range Status  10/29/2022 13.5 (H) 3.8 - 10.8 Thousand/uL Final   RBC  Date Value Ref Range Status  10/29/2022 5.23 (H) 3.80 - 5.10 Million/uL Final   Hemoglobin  Date Value Ref Range Status  10/29/2022 15.0 11.7 - 15.5 g/dL Final   HCT  Date Value Ref Range Status  10/29/2022 44.7 35.0 - 45.0 % Final   MCHC  Date Value Ref Range Status  10/29/2022 33.6 32.0 - 36.0 g/dL Final   [facility]  Date Value Ref Range Status  10/29/2022 28.7 27.0 - 33.0 pg Final   MCV  Date Value Ref Range Status  10/29/2022 85.5 80.0 - 100.0 fL Final   No results found for: PLTCOUNTKUC, LABPLAT, POCPLA RDW  Date Value Ref Range Status  10/29/2022 13.5 11.0 - 15.0 % Final         Passed - Valid encounter within last 12 months    Recent Outpatient Visits           10 months ago Wheezing on expiration   [place] [facility] Mecum, Erin E, PA-C   10 months ago Acute bacterial bronchitis   [facility] [place], Angeline ORN, NP   1 year ago Acute cough   [place] [facility] [place], Angeline ORN, NP   1 year ago Viral respiratory illness   [place] [facility] [place], Angeline ORN, NP   1 year ago Acute nasopharyngitis   [place] [facility] [place], Angeline ORN, NP       Future Appointments             In 1 week Antonette Angeline ORN, NP [place] [facility]             traZODone  (DESYREL ) 50 MG tablet 90 tablet 0    Sig: Take 1 tablet (50 mg total) by mouth at bedtime.     Psychiatry: Antidepressants - Serotonin Modulator Failed - 12/16/2023  2:25 PM      Failed - Valid encounter within last 6 months    Recent Outpatient Visits           10 months ago Wheezing on expiration   [place] [facility] Mecum, [place] E, [place]   10 months ago Acute bacterial bronchitis   [place] [facility] [place], Angeline ORN, NP   1 year ago Acute cough   [place] [facility] [place], Angeline ORN, NP   1 year ago Viral respiratory illness   [place] [facility] [place], Angeline ORN, NP   1 year ago Acute nasopharyngitis   [place] [facility] [place], Angeline ORN, NP       Future Appointments             In 1 week Antonette Angeline ORN, NP [place] [facility]            Passed - Completed PHQ-2 or PHQ-9 in the last 360 days       hydrochlorothiazide  (HYDRODIURIL ) 25 MG tablet 90 tablet 0    Sig: Take 1 tablet (25 mg total) by mouth daily.     Cardiovascular: Diuretics - Thiazide Failed - 12/16/2023  2:25 PM      Failed - Cr in normal range and within 180 days    Creat  Date Value Ref Range Status  10/29/2022 0.89 0.50 - 1.05 mg/dL Final         Failed - K in normal range and within 180 days    Potassium  Date Value Ref Range Status  10/29/2022 3.8 3.5 - 5.3 mmol/L Final         Failed - Na in normal range and within 180 days    Sodium  Date Value Ref Range Status  10/29/2022 139 135 - 146 mmol/L Final         Failed - Valid encounter within last 6 months    Recent Outpatient  Visits           10 months ago Wheezing on expiration   [place] [facility] Mecum, [place] E, [place]   10 months ago Acute bacterial bronchitis   [facility] [place], Angeline ORN, NP   1 year ago Acute cough   [place] [facility] [place], Angeline ORN, NP   1 year ago Viral respiratory illness    [facility] [place], Angeline ORN, NP   1 year ago Acute nasopharyngitis   [place] [facility] [place], Angeline ORN, NP       Future Appointments             In 1 week Antonette Angeline ORN, NP [place] [facility]            Passed - Last BP in normal range    BP Readings from Last 1 Encounters:  10/20/23 122/64          citalopram  (CELEXA ) 40 MG tablet 90 tablet 0    Sig: Take 1 tablet (40 mg total) by mouth daily.     Psychiatry:  Antidepressants - SSRI Failed - 12/16/2023  2:25 PM      Failed - Valid encounter within last 6 months    Recent Outpatient Visits           10 months ago Wheezing on expiration   [facility] Mecum, [place] E, [place]   10 months ago Acute bacterial bronchitis   [place] [facility] [place], Angeline ORN, NP   1 year ago Acute cough   [facility] [place], Angeline ORN, NP   1 year ago Viral respiratory illness   [place] [facility], Angeline ORN, NP   1 year ago Acute nasopharyngitis   [place] [place], [place], NP       Future Appointments             In 1 week Antonette Angeline ORN, NP [facility]            Passed - Completed PHQ-2 or PHQ-9 in the last 360 days       ALPRAZolam  (XANAX ) 0.25 MG tablet 20 tablet 0    Sig: Take 1 tablet (0.25 mg total) by mouth daily as needed for anxiety.     Not Delegated - Psychiatry: Anxiolytics/Hypnotics 2 Failed - 12/16/2023  2:25 PM      Failed -  This refill cannot be delegated      Failed - Urine Drug Screen completed in last 360 days      Failed - Valid encounter within last 6 months    Recent Outpatient Visits           10 months ago Wheezing on expiration   [place] [facility] [place]ecum, [place], [place]   10 months ago Acute bacterial bronchitis   [place] [facility] [place], Angeline ORN, NP   1 year ago Acute cough   [place] [facility] [place], Angeline ORN, NP   1 year ago Viral respiratory illness   [place] [facility], Angeline ORN, NP   1 year ago Acute nasopharyngitis   [place] [facility] [place], Angeline ORN, NP       Future Appointments             In 1 week Antonette, Angeline ORN, NP  [facility], [facility]            Passed - Patient is not pregnant

## 2023-12-19 ENCOUNTER — Other Ambulatory Visit (HOSPITAL_COMMUNITY): Payer: Self-pay

## 2023-12-19 ENCOUNTER — Telehealth: Payer: Self-pay

## 2023-12-19 ENCOUNTER — Other Ambulatory Visit: Payer: Self-pay

## 2023-12-19 MED ORDER — ALLOPURINOL 100 MG PO TABS
100.0000 mg | ORAL_TABLET | Freq: Every day | ORAL | 0 refills | Status: DC
  Filled 2023-12-19: qty 90, 90d supply, fill #0

## 2023-12-19 MED ORDER — ALPRAZOLAM 0.25 MG PO TABS
0.2500 mg | ORAL_TABLET | Freq: Every day | ORAL | 0 refills | Status: DC | PRN
  Filled 2023-12-19: qty 20, 20d supply, fill #0

## 2023-12-19 MED ORDER — ROSUVASTATIN CALCIUM 5 MG PO TABS
5.0000 mg | ORAL_TABLET | Freq: Every day | ORAL | 0 refills | Status: DC
  Filled 2023-12-19: qty 90, 90d supply, fill #0

## 2023-12-19 MED ORDER — HYDROCHLOROTHIAZIDE 25 MG PO TABS
25.0000 mg | ORAL_TABLET | Freq: Every day | ORAL | 0 refills | Status: DC
  Filled 2023-12-19: qty 90, 90d supply, fill #0

## 2023-12-19 MED ORDER — TRAZODONE HCL 50 MG PO TABS
50.0000 mg | ORAL_TABLET | Freq: Every evening | ORAL | 0 refills | Status: DC
  Filled 2023-12-19: qty 90, 90d supply, fill #0

## 2023-12-19 MED ORDER — CITALOPRAM HYDROBROMIDE 40 MG PO TABS
40.0000 mg | ORAL_TABLET | Freq: Every day | ORAL | 0 refills | Status: DC
  Filled 2023-12-19: qty 90, 90d supply, fill #0

## 2023-12-19 MED ORDER — AMLODIPINE BESYLATE 5 MG PO TABS
5.0000 mg | ORAL_TABLET | Freq: Every day | ORAL | 0 refills | Status: DC
  Filled 2023-12-19: qty 90, 90d supply, fill #0

## 2023-12-19 NOTE — Telephone Encounter (Signed)
 Patient has been notified via MyChart to discuss medications and upcoming appointment.

## 2023-12-19 NOTE — Telephone Encounter (Signed)
 Copied from CRM [id number]. Topic: General - Other >> Dec 18, 2023 12:00 PM Teressa P wrote: Reason for CRM: pt called saying she has several medications waiting for approval that she needs filled.  She needs them asap.

## 2023-12-22 ENCOUNTER — Other Ambulatory Visit: Payer: Self-pay

## 2023-12-22 MED ORDER — ROSUVASTATIN CALCIUM 5 MG PO TABS: 5.0000 mg | ORAL_TABLET | Freq: Every day | ORAL | 0 refills | Status: DC

## 2023-12-22 MED ORDER — CITALOPRAM HYDROBROMIDE 40 MG PO TABS: 40.0000 mg | ORAL_TABLET | Freq: Every day | ORAL | 0 refills | Status: DC

## 2023-12-22 MED ORDER — ALLOPURINOL 100 MG PO TABS: 100.0000 mg | ORAL_TABLET | Freq: Every day | ORAL | 0 refills | Status: DC

## 2023-12-22 MED ORDER — AMLODIPINE BESYLATE 5 MG PO TABS: 5.0000 mg | ORAL_TABLET | Freq: Every day | ORAL | 0 refills | Status: DC

## 2023-12-24 ENCOUNTER — Encounter: Payer: Self-pay | Admitting: Internal Medicine

## 2023-12-24 ENCOUNTER — Ambulatory Visit (INDEPENDENT_AMBULATORY_CARE_PROVIDER_SITE_OTHER): Payer: 59 | Admitting: Internal Medicine

## 2023-12-24 ENCOUNTER — Other Ambulatory Visit (HOSPITAL_COMMUNITY): Payer: Self-pay

## 2023-12-24 VITALS — BP 120/68 | Ht 68.0 in | Wt 202.0 lb

## 2023-12-24 DIAGNOSIS — F419 Anxiety disorder, unspecified: Secondary | ICD-10-CM | POA: Diagnosis not present

## 2023-12-24 DIAGNOSIS — E66811 Obesity, class 1: Secondary | ICD-10-CM

## 2023-12-24 DIAGNOSIS — F5101 Primary insomnia: Secondary | ICD-10-CM

## 2023-12-24 DIAGNOSIS — R7303 Prediabetes: Secondary | ICD-10-CM

## 2023-12-24 DIAGNOSIS — G7000 Myasthenia gravis without (acute) exacerbation: Secondary | ICD-10-CM | POA: Diagnosis not present

## 2023-12-24 DIAGNOSIS — I10 Essential (primary) hypertension: Secondary | ICD-10-CM

## 2023-12-24 DIAGNOSIS — F32A Depression, unspecified: Secondary | ICD-10-CM

## 2023-12-24 DIAGNOSIS — M10079 Idiopathic gout, unspecified ankle and foot: Secondary | ICD-10-CM

## 2023-12-24 DIAGNOSIS — E7800 Pure hypercholesterolemia, unspecified: Secondary | ICD-10-CM

## 2023-12-24 DIAGNOSIS — E6609 Other obesity due to excess calories: Secondary | ICD-10-CM

## 2023-12-24 DIAGNOSIS — Z6830 Body mass index (BMI) 30.0-30.9, adult: Secondary | ICD-10-CM

## 2023-12-24 MED ORDER — HYDROCHLOROTHIAZIDE 25 MG PO TABS
25.0000 mg | ORAL_TABLET | Freq: Every day | ORAL | 0 refills | Status: DC
  Filled 2023-12-24 – 2024-03-17 (×2): qty 90, 90d supply, fill #0

## 2023-12-24 MED ORDER — AMLODIPINE BESYLATE 5 MG PO TABS
5.0000 mg | ORAL_TABLET | Freq: Every day | ORAL | 1 refills | Status: DC
  Filled 2023-12-24 – 2024-01-16 (×2): qty 90, 90d supply, fill #0
  Filled 2024-04-14: qty 90, 90d supply, fill #1

## 2023-12-24 MED ORDER — ALLOPURINOL 100 MG PO TABS
100.0000 mg | ORAL_TABLET | Freq: Every day | ORAL | 1 refills | Status: DC
  Filled 2023-12-24 – 2024-01-16 (×2): qty 90, 90d supply, fill #0

## 2023-12-24 MED ORDER — CITALOPRAM HYDROBROMIDE 40 MG PO TABS
40.0000 mg | ORAL_TABLET | Freq: Every day | ORAL | 1 refills | Status: DC
  Filled 2023-12-24 – 2024-02-23 (×4): qty 90, 90d supply, fill #0
  Filled 2024-06-16: qty 90, 90d supply, fill #1

## 2023-12-24 MED ORDER — TRAZODONE HCL 50 MG PO TABS
50.0000 mg | ORAL_TABLET | Freq: Every evening | ORAL | 0 refills | Status: DC
  Filled 2023-12-24 – 2024-04-14 (×2): qty 90, 90d supply, fill #0

## 2023-12-24 NOTE — Assessment & Plan Note (Signed)
C-Met and lipid profile today  Encouraged her to consume a low-fat diet Continue rosuvastatin 

## 2023-12-24 NOTE — Patient Instructions (Signed)
Prediabetes Eating Plan Prediabetes is a condition that causes blood sugar (glucose) levels to be higher than normal. This increases the risk for developing type 2 diabetes (type 2 diabetes mellitus). Working with a health care provider or nutrition specialist (dietitian) to make diet and lifestyle changes can help prevent the onset of diabetes. These changes may help you: Control your blood glucose levels. Improve your cholesterol levels. Manage your blood pressure. What are tips for following this plan? Reading food labels Read food labels to check the amount of fat, salt (sodium), and sugar in prepackaged foods. Avoid foods that have: Saturated fats. Trans fats. Added sugars. Avoid foods that have more than 300 milligrams (mg) of sodium per serving. Limit your sodium intake to less than 2,300 mg each day. Shopping Avoid buying pre-made and processed foods. Avoid buying drinks with added sugar. Cooking Cook with olive oil. Do not use butter, lard, or ghee. Bake, broil, grill, steam, or boil foods. Avoid frying. Meal planning  Work with your dietitian to create an eating plan that is right for you. This may include tracking how many calories you take in each day. Use a food diary, notebook, or mobile application to track what you eat at each meal. Consider following a Mediterranean diet. This includes: Eating several servings of fresh fruits and vegetables each day. Eating fish at least twice a week. Eating one serving each day of whole grains, beans, nuts, and seeds. Using olive oil instead of other fats. Limiting alcohol. Limiting red meat. Using nonfat or low-fat dairy products. Consider following a plant-based diet. This includes dietary choices that focus on eating mostly vegetables and fruit, grains, beans, nuts, and seeds. If you have high blood pressure, you may need to limit your sodium intake or follow a diet such as the DASH (Dietary Approaches to Stop Hypertension) eating  plan. The DASH diet aims to lower high blood pressure. Lifestyle Set weight loss goals with help from your health care team. It is recommended that most people with prediabetes lose 7% of their body weight. Exercise for at least 30 minutes 5 or more days a week. Attend a support group or seek support from a mental health counselor. Take over-the-counter and prescription medicines only as told by your health care provider. What foods are recommended? Fruits Berries. Bananas. Apples. Oranges. Grapes. Papaya. Mango. Pomegranate. Kiwi. Grapefruit. Cherries. Vegetables Lettuce. Spinach. Peas. Beets. Cauliflower. Cabbage. Broccoli. Carrots. Tomatoes. Squash. Eggplant. Herbs. Peppers. Onions. Cucumbers. Brussels sprouts. Grains Whole grains, such as whole-wheat or whole-grain breads, crackers, cereals, and pasta. Unsweetened oatmeal. Bulgur. Barley. Quinoa. Brown rice. Corn or whole-wheat flour tortillas or taco shells. Meats and other proteins Seafood. Poultry without skin. Lean cuts of pork and beef. Tofu. Eggs. Nuts. Beans. Dairy Low-fat or fat-free dairy products, such as yogurt, cottage cheese, and cheese. Beverages Water. Tea. Coffee. Sugar-free or diet soda. Seltzer water. Low-fat or nonfat milk. Milk alternatives, such as soy or almond milk. Fats and oils Olive oil. Canola oil. Sunflower oil. Grapeseed oil. Avocado. Walnuts. Sweets and desserts Sugar-free or low-fat pudding. Sugar-free or low-fat ice cream and other frozen treats. Seasonings and condiments Herbs. Sodium-free spices. Mustard. Relish. Low-salt, low-sugar ketchup. Low-salt, low-sugar barbecue sauce. Low-fat or fat-free mayonnaise. The items listed above may not be a complete list of recommended foods and beverages. Contact a dietitian for more information. What foods are not recommended? Fruits Fruits canned with syrup. Vegetables Canned vegetables. Frozen vegetables with butter or cream sauce. Grains Refined white  flour and flour   products, such as bread, pasta, snack foods, and cereals. Meats and other proteins Fatty cuts of meat. Poultry with skin. Breaded or fried meat. Processed meats. Dairy Full-fat yogurt, cheese, or milk. Beverages Sweetened drinks, such as iced tea and soda. Fats and oils Butter. Lard. Ghee. Sweets and desserts Baked goods, such as cake, cupcakes, pastries, cookies, and cheesecake. Seasonings and condiments Spice mixes with added salt. Ketchup. Barbecue sauce. Mayonnaise. The items listed above may not be a complete list of foods and beverages that are not recommended. Contact a dietitian for more information. Where to find more information American Diabetes Association: www.diabetes.org Summary You may need to make diet and lifestyle changes to help prevent the onset of diabetes. These changes can help you control blood sugar, improve cholesterol levels, and manage blood pressure. Set weight loss goals with help from your health care team. It is recommended that most people with prediabetes lose 7% of their body weight. Consider following a Mediterranean diet. This includes eating plenty of fresh fruits and vegetables, whole grains, beans, nuts, seeds, fish, and low-fat dairy, and using olive oil instead of other fats. This information is not intended to replace advice given to you by your health care provider. Make sure you discuss any questions you have with your health care provider. Document Revised: 03/02/2020 Document Reviewed: 03/02/2020 Elsevier Patient Education  2024 [employer]  

## 2023-12-24 NOTE — Progress Notes (Signed)
 Subjective:    Patient ID: SHEFALI NG, female    DOB: [birth year], 63 y.o.   MRN: [id number]  HPI  Patient presents to clinic today for follow-up of chronic conditions.  Anxiety and depression: Chronic, managed on citalopram  and xanax . She is not currently seeing a therapist. She denies SI/HI.  Gout: She denies recent flare on allopurinol . She has colchicine  to take as needed. She does not follow with rheumatology.  HLD: Her last LDL was 186, triglycerides 239, 05/2022. She denies myalgias on rosuvastatin . She tries to consume a low fat diet.  HTN: Her BP today is 120/68.She is taking amlodipine  and hctz as prescribed. ECG from  11/2016 reviewed.  Insomnia: She has difficulty.  She takes trazadone as prescribed. There is no sleep studying of file.  Myasthenia Gravis: Asymptomatic s/p thymectomy.  Prediabetes: Her last A1C was 5.7%, 05/2022. She is not taking any oral diabetic medication at this time. She does not check her sugars.  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 pt states she received 9 pints of blood   Vaginal intraepithelial neoplasia III (VAIN III)     Current Outpatient Medications  Medication Sig Dispense Refill   allopurinol  (ZYLOPRIM ) 100 MG tablet Take 1 tablet (100 mg total) by mouth daily. 30 tablet 0   ALPRAZolam  (XANAX ) 0.25 MG tablet Take 1 tablet (0.25 mg total) by mouth daily as needed for anxiety. 20 tablet 0   amLODipine  (NORVASC ) 5 MG tablet Take 1 tablet (5 mg total) by mouth daily. 30 tablet 0    citalopram  (CELEXA ) 40 MG tablet Take 1 tablet (40 mg total) by mouth daily. 30 tablet 0   colchicine  0.6 MG tablet TAKE 1 TABLET BY MOUTH 2 TIMES DAILY FOR 7 - 10 DAYS THEN TAKE AS DIRECTED AS NEEDED FOR ATTACKS. 90 tablet 1   hydrochlorothiazide  (HYDRODIURIL ) 25 MG tablet Take 1 tablet (25 mg total) by mouth daily. 90 tablet 0   Multiple Vitamin (MULTIVITAMIN) tablet Take 1 tablet by mouth daily.     rosuvastatin  (CRESTOR ) 5 MG tablet Take 1 tablet (5 mg total) by mouth daily. 30 tablet 0   traZODone  (DESYREL ) 50 MG tablet Take 1 tablet (50 mg total) by mouth at bedtime. 90 tablet 0   No current facility-administered medications for this visit.    Allergies  Allergen Reactions   Doxycycline  Other (See Comments)    Oral blisters   Penicillins Rash    - has tolerated cephalosporins    Family History  Problem Relation Age of Onset   Hypertension Mother    Diabetes Mother    Breast cancer Mother    Alcohol abuse Father    Hypertension Father    Stroke Father    Colon cancer Father 43   Heart disease Brother 65       MI   Colon  polyps Brother    Colon polyps Sister    Colon polyps Sister    Colon polyps Brother    Esophageal cancer Neg Hx    Rectal cancer Neg Hx    Stomach cancer Neg Hx     Social History   Socioeconomic History   Marital status: Single    Spouse name: Not on file   Number of children: Not on file   Years of education: Not on file   Highest education level: Bachelor's degree (e.g., BA, AB, BS)  Occupational History   Not on file  Tobacco Use   Smoking status: Never   Smokeless tobacco: Never  Vaping Use   Vaping status: Never Used  Substance and Sexual Activity   Alcohol use: Yes    Comment: rarely   Drug use: No   Sexual activity: Not on file  Other Topics Concern   Not on file  Social History Narrative   Married, twin daughters born 36, son born 73   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   Social Drivers of Corporate Investment Banker Strain:  Low Risk  (12/22/2023)   Overall Financial Resource Strain (CARDIA)    Difficulty of Paying Living Expenses: Not hard at all  Food Insecurity: No Food Insecurity (12/22/2023)   Hunger Vital Sign    Worried About Running Out of Food in the Last Year: Never true    Ran Out of Food in the Last Year: Never true  Transportation Needs: No Transportation Needs (12/22/2023)   PRAPARE - Administrator, Civil Service (Medical): No    Lack of Transportation (Non-Medical): No  Physical Activity: Insufficiently Active (12/22/2023)   Exercise Vital Sign    Days of Exercise per Week: 5 days    Minutes of Exercise per Session: 20 min  Stress: No Stress Concern Present (12/22/2023)   [employer] of Occupational Health - Occupational Stress Questionnaire    Feeling of Stress : Only a little  Social Connections: Moderately Integrated (12/22/2023)   Social Connection and Isolation Panel [NHANES]    Frequency of Communication with Friends and Family: More than three times a week    Frequency of Social Gatherings with Friends and Family: Twice a week    Attends Religious Services: More than 4 times per year    Active Member of [employer] or Organizations: Yes    Attends Banker Meetings: 1 to 4 times per year    Marital Status: Divorced  Catering Manager Violence: Not on file     Constitutional: Denies fever, malaise, fatigue, headache or abrupt weight changes.  HEENT: Denies eye pain, eye redness, ear pain, ringing in the ears, wax buildup, runny nose, nasal congestion, bloody nose, or sore throat. Respiratory: Denies difficulty breathing, shortness of breath, cough or sputum production.   Cardiovascular: Denies chest pain, chest tightness, palpitations or swelling in the hands or feet.  Gastrointestinal: Denies abdominal pain, bloating, constipation, diarrhea or blood in the stool.  GU: Denies urgency, frequency, pain with urination, burning sensation, blood in urine, odor or  discharge. Musculoskeletal: Denies decrease in range of motion, difficulty with gait, muscle pain or joint pain or swelling.  Skin:  Denies redness, rashes, lesion or ulcercations.  Neurological: Patient reports insomnia.  Denies dizziness, difficulty with memory, difficulty with speech or problems with balance and coordination.  Psych: Patient has a history of anxiety and depression.  Denies SI/HI.  No other specific complaints in a complete review of systems (except as  listed in HPI above).  Objective:   Physical Exam   BP 120/68 (BP Location: Left Arm, Patient Position: Sitting, Cuff Size: Large)   Ht 5' 8 (1.727 m)   Wt 202 lb (91.6 kg)   BMI 30.71 kg/m    Wt Readings from Last 3 Encounters:  10/20/23 196 lb 12.8 oz (89.3 kg)  01/27/23 203 lb (92.1 kg)  10/29/22 202 lb (91.6 kg)    General: Appears her stated age, obese, in NAD. Skin: Warm, dry and intact.  HEENT: Head: normal shape and size; Eyes: sclera white, no icterus, conjunctiva pink, PERRLA and EOMs intact;  Cardiovascular: Normal rate and rhythm. S1,S2 noted.  No murmur, rubs or gallops noted. No JVD or BLE edema. No carotid bruits noted. Pulmonary/Chest: Normal effort and positive vesicular breath sounds. No respiratory distress. No wheezes, rales or ronchi noted.   Musculoskeletal: No difficulty with gait.  Neurological: Alert and oriented. Cranial nerves II-XII grossly intact. Coordination normal.  Psychiatric: Mood and affect normal. Behavior is normal. Judgment and thought content normal.   BMET    Component Value Date/Time   NA 139 10/29/2022 0902   K 3.8 10/29/2022 0902   CL 96 (L) 10/29/2022 0902   CO2 32 10/29/2022 0902   GLUCOSE 73 10/29/2022 0902   BUN 11 10/29/2022 0902   CREATININE 0.89 10/29/2022 0902   CALCIUM  9.4 10/29/2022 0902   GFRNONAA 73 05/15/2021 0822   GFRAA 85 05/15/2021 0822    Lipid Panel     Component Value Date/Time   CHOL 277 (H) 05/28/2022 0824   TRIG 239 (H) 05/28/2022  0824   HDL 47 (L) 05/28/2022 0824   CHOLHDL 5.9 (H) 05/28/2022 0824   VLDL 38.4 09/20/2019 1004   LDLCALC 186 (H) 05/28/2022 0824    CBC    Component Value Date/Time   WBC 13.5 (H) 10/29/2022 0902   RBC 5.23 (H) 10/29/2022 0902   HGB 15.0 10/29/2022 0902   HCT 44.7 10/29/2022 0902   PLT 365 10/29/2022 0902   MCV 85.5 10/29/2022 0902   MCH 28.7 10/29/2022 0902   MCHC 33.6 10/29/2022 0902   RDW 13.5 10/29/2022 0902   LYMPHSABS 1.9 09/20/2019 1004   MONOABS 0.5 09/20/2019 1004   EOSABS 0.2 09/20/2019 1004   BASOSABS 0.0 09/20/2019 1004    Hgb A1C Lab Results  Component Value Date   HGBA1C 5.7 (H) 05/28/2022           Assessment & Plan:     RTC in 6 months for your annual exam Angeline Laura, NP

## 2023-12-24 NOTE — Assessment & Plan Note (Signed)
 Continue HCTZ and Amlodipine Reinforced DASH diet and exercise for weight loss C-Met today

## 2023-12-24 NOTE — Assessment & Plan Note (Signed)
 Uric acid level today Continue Allopurinol Has not needed Colchicine but will continue to have on hand

## 2023-12-24 NOTE — Assessment & Plan Note (Signed)
A1c today Encouraged low carb diet and exercise for weight loss 

## 2023-12-24 NOTE — Assessment & Plan Note (Signed)
Stable on Citalopram and Xanax Support offered

## 2023-12-24 NOTE — Assessment & Plan Note (Signed)
 Encourage diet and exercise for weight loss

## 2023-12-24 NOTE — Assessment & Plan Note (Signed)
Asymptomatic We will monitor

## 2023-12-24 NOTE — Assessment & Plan Note (Signed)
Continue Trazodone °We will monitor °

## 2023-12-25 ENCOUNTER — Encounter: Payer: Self-pay | Admitting: Internal Medicine

## 2023-12-25 LAB — COMPLETE METABOLIC PANEL WITH GFR
AG Ratio: 1.6 (calc) (ref 1.0–2.5)
ALT: 20 U/L (ref 6–29)
AST: 18 U/L (ref 10–35)
Albumin: 4.2 g/dL (ref 3.6–5.1)
Alkaline phosphatase (APISO): 85 U/L (ref 37–153)
BUN: 8 mg/dL (ref 7–25)
CO2: 29 mmol/L (ref 20–32)
Calcium: 9.4 mg/dL (ref 8.6–10.4)
Chloride: 102 mmol/L (ref 98–110)
Creat: 0.82 mg/dL (ref 0.50–1.05)
Globulin: 2.6 g/dL (ref 1.9–3.7)
Glucose, Bld: 88 mg/dL (ref 65–99)
Potassium: 3.8 mmol/L (ref 3.5–5.3)
Sodium: 141 mmol/L (ref 135–146)
Total Bilirubin: 0.5 mg/dL (ref 0.2–1.2)
Total Protein: 6.8 g/dL (ref 6.1–8.1)
eGFR: 81 mL/min/{1.73_m2} (ref >=60)

## 2023-12-25 LAB — CBC
HCT: 43 % (ref 35.0–45.0)
Hemoglobin: 13.8 g/dL (ref 11.7–15.5)
MCH: 27.7 pg (ref 27.0–33.0)
MCHC: 32.1 g/dL (ref 32.0–36.0)
MCV: 86.3 fL (ref 80.0–100.0)
MPV: 10.3 fL (ref 7.5–12.5)
Platelets: 290 10*3/uL (ref 140–400)
RBC: 4.98 10*6/uL (ref 3.80–5.10)
RDW: 13.3 % (ref 11.0–15.0)
WBC: 8.2 10*3/uL (ref 3.8–10.8)

## 2023-12-25 LAB — HEMOGLOBIN A1C
Hgb A1c MFr Bld: 6.1 %{Hb} — ABNORMAL HIGH (ref <5.7)
Mean Plasma Glucose: 128 mg/dL
eAG (mmol/L): 7.1 mmol/L

## 2023-12-25 LAB — LIPID PANEL
Cholesterol: 242 mg/dL — ABNORMAL HIGH (ref <200)
HDL: 59 mg/dL (ref >=50)
LDL Cholesterol (Calc): 148 mg/dL — ABNORMAL HIGH
Non-HDL Cholesterol (Calc): 183 mg/dL — ABNORMAL HIGH (ref <130)
Total CHOL/HDL Ratio: 4.1 (calc) (ref <5.0)
Triglycerides: 210 mg/dL — ABNORMAL HIGH (ref <150)

## 2023-12-25 LAB — URIC ACID: Uric Acid, Serum: 7.8 mg/dL — ABNORMAL HIGH (ref 2.5–7.0)

## 2023-12-26 ENCOUNTER — Other Ambulatory Visit (HOSPITAL_COMMUNITY): Payer: Self-pay

## 2023-12-30 ENCOUNTER — Ambulatory Visit (INDEPENDENT_AMBULATORY_CARE_PROVIDER_SITE_OTHER): Payer: 59

## 2023-12-30 ENCOUNTER — Encounter: Payer: Self-pay | Admitting: Podiatry

## 2023-12-30 ENCOUNTER — Ambulatory Visit (INDEPENDENT_AMBULATORY_CARE_PROVIDER_SITE_OTHER): Payer: 59 | Admitting: Podiatry

## 2023-12-30 DIAGNOSIS — M778 Other enthesopathies, not elsewhere classified: Secondary | ICD-10-CM

## 2023-12-30 MED ORDER — METHYLPREDNISOLONE 4 MG PO TBPK: ORAL_TABLET | ORAL | 0 refills | Status: DC

## 2023-12-31 NOTE — Progress Notes (Signed)
 Subjective:   Patient ID: Regina Huber, female   DOB: 63 y.o.   MRN: [id number]   HPI Chief Complaint  Patient presents with   Toe Pain    RM#14 Right foot second toe patient states it feel like a nail in joint and a ball sensation on the ball of foot X 1 month. Patient states she doesn't think it is a gout flare up.   63 year old female presents the office with above concerns.  She does not report any recent injury but over the last month she began stabbing pain to the joint left second toe point on the MTPJ.  She has history of gout but she states this feels completely different.  She feels it swollen but she does not appreciate any swelling when looking at it.  No recent treatment.   Review of Systems  All other systems reviewed and are negative.  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 pt states she received 9 pints of blood   Vaginal intraepithelial neoplasia III (VAIN III)     Past Surgical History:  Procedure Laterality Date   ABDOMINAL HYSTERECTOMY  05-07-2000   w/  Right Ovarian Cystectomy   CARDIOVASCULAR STRESS TEST  12-11-2016  dr victory sharps   normal nuclera study w/ no ischemia/  normal LV function and wall motion, nuclear stress ef 59% (BP demonstrated hypertensive response to exercise)   CESAREAN SECTION  1988  &  1995   CO2 LASER APPLICATION N/A 01/04/2016   Procedure: CO2 LASER VAPORIZATION OF THE VAGINA;  Surgeon: Maurilio Ship, MD;  Location: [facility] [place];  Service: Gynecology;  Laterality: N/A;   CO2 LASER APPLICATION N/A  09/11/2017   Procedure: CO2 LASER VAPORIZATION OF THE VAGINA;  Surgeon: Ship Maurilio, MD;  Location: [facility] [place];  Service: Gynecology;  Laterality: N/A;   COMBINED ABDOMINOPLASTY AND LIPOSUCTION  03-12-2002   LAPAROTOMY/  SUTURE INTRAPERITONEAL BLEEDING  05-08-2000   LUMBAR DISC SURGERY  11-07-2010   L5 - S1   TRANSSTERNAL THYMECTOMY  04-29-2005   TRANSTHORACIC ECHOCARDIOGRAM  12-11-2016  dr victory sharps   grade 1 diastolic dysfunction, ef 55-60%/  trivial MR, PR, and TR     Current Outpatient Medications:    allopurinol  (ZYLOPRIM ) 100 MG tablet, Take 1 tablet (100 mg total) by mouth daily., Disp: 90 tablet, Rfl: 1   ALPRAZolam  (XANAX ) 0.25 MG tablet, Take 1 tablet (0.25 mg total) by mouth daily as needed for anxiety., Disp: 20 tablet, Rfl: 0   amLODipine  (NORVASC ) 5 MG tablet, Take 1 tablet (5 mg total) by mouth daily., Disp: 90 tablet, Rfl: 1   citalopram  (CELEXA ) 40 MG tablet, Take 1 tablet (40 mg total) by mouth daily., Disp: 90 tablet, Rfl: 1   colchicine  0.6 MG tablet, TAKE 1 TABLET BY MOUTH 2 TIMES DAILY FOR 7 - 10 DAYS THEN TAKE AS DIRECTED AS NEEDED FOR  ATTACKS., Disp: 90 tablet, Rfl: 1   hydrochlorothiazide  (HYDRODIURIL ) 25 MG tablet, Take 1 tablet (25 mg total) by mouth daily., Disp: 90 tablet, Rfl: 0   methylPREDNISolone  (MEDROL  DOSEPAK) 4 MG TBPK tablet, Take as directed, Disp: 21 tablet, Rfl: 0   Multiple Vitamin (MULTIVITAMIN) tablet, Take 1 tablet by mouth daily., Disp: , Rfl:    rosuvastatin  (CRESTOR ) 5 MG tablet, Take 1 tablet (5 mg total) by mouth daily., Disp: 30 tablet, Rfl: 0   traZODone  (DESYREL ) 50 MG tablet, Take 1 tablet (50 mg total) by mouth at bedtime., Disp: 90 tablet, Rfl: 0  Allergies  Allergen Reactions   Doxycycline  Other (See Comments)    Oral blisters   Penicillins Rash    - has tolerated cephalosporins          Objective:  Physical Exam  General: AAO x3, NAD  Dermatological: Skin is warm, dry and supple bilateral. There are no  open sores, no preulcerative lesions, no rash or signs of infection present.  Vascular: Dorsalis Pedis artery and Posterior Tibial artery pedal pulses are 2/4 bilateral with immedate capillary fill time.  There is no pain with calf compression, swelling, warmth, erythema.   Neruologic: Grossly intact via light touch bilateral.  Musculoskeletal: There is tenderness palpation of the right second MTPJ.  Some trace edema present compared to the contralateral but there is no erythema or warmth.  The toe does sit slightly deviated in transverse plane.  There is no area pinpoint tenderness.  No pain in the actual toe itself.  There is no palpable neuroma but she does get some tenderness on the interspace.  Gait: Unassisted, Nonantalgic.       Assessment:   Capsulitis right second MPJ, possible neuroma     Plan:  -Treatment options discussed including all alternatives, risks, and complications -Etiology of symptoms were discussed -X-rays were obtained and reviewed with the patient.  3 views right foot were obtained.  No evidence of acute fracture.  Medial deviation of the digits noted.  Previous resection of the fifth proximal phalanx. -Discussed steroid injection but she will try oral steroids first.  Prescribed Medrol  Dosepak.  Discussed icing daily.  Discussed metatarsal pads as well as a gel metatarsal pad for support.  If no improvement consider steroid injection.  Return in about 4 weeks (around 01/27/2024) for right foot pain.  Donnice JONELLE Fees DPM

## 2024-01-13 DIAGNOSIS — Z1272 Encounter for screening for malignant neoplasm of vagina: Secondary | ICD-10-CM | POA: Diagnosis not present

## 2024-01-13 DIAGNOSIS — Z01419 Encounter for gynecological examination (general) (routine) without abnormal findings: Secondary | ICD-10-CM | POA: Diagnosis not present

## 2024-01-13 DIAGNOSIS — Z6831 Body mass index (BMI) 31.0-31.9, adult: Secondary | ICD-10-CM | POA: Diagnosis not present

## 2024-01-13 DIAGNOSIS — Z1151 Encounter for screening for human papillomavirus (HPV): Secondary | ICD-10-CM | POA: Diagnosis not present

## 2024-01-13 DIAGNOSIS — Z1231 Encounter for screening mammogram for malignant neoplasm of breast: Secondary | ICD-10-CM | POA: Diagnosis not present

## 2024-01-13 LAB — HM PAP SMEAR: HPV, high-risk: NEGATIVE

## 2024-01-13 LAB — HM MAMMOGRAPHY

## 2024-01-14 ENCOUNTER — Other Ambulatory Visit: Payer: Self-pay

## 2024-01-15 ENCOUNTER — Other Ambulatory Visit: Payer: Self-pay | Admitting: Internal Medicine

## 2024-01-16 ENCOUNTER — Ambulatory Visit (INDEPENDENT_AMBULATORY_CARE_PROVIDER_SITE_OTHER): Payer: Self-pay | Admitting: Family Medicine

## 2024-01-16 ENCOUNTER — Encounter: Payer: Self-pay | Admitting: Family Medicine

## 2024-01-16 VITALS — BP 132/70 | HR 79 | Temp 98.7°F | Ht 68.0 in | Wt 200.0 lb

## 2024-01-16 DIAGNOSIS — J0110 Acute frontal sinusitis, unspecified: Secondary | ICD-10-CM

## 2024-01-16 DIAGNOSIS — H6991 Unspecified Eustachian tube disorder, right ear: Secondary | ICD-10-CM

## 2024-01-16 MED ORDER — AZITHROMYCIN 250 MG PO TABS: ORAL_TABLET | ORAL | 0 refills | Status: DC

## 2024-01-16 MED ORDER — FLUTICASONE PROPIONATE 50 MCG/ACT NA SUSP: 2.0000 | Freq: Every day | NASAL | 0 refills | Status: DC

## 2024-01-16 NOTE — Patient Instructions (Addendum)
Thank you for coming to the office today.  No ear infection today yet But there is significant clear fluid behind R ear. Likely eustachian tube dysfunction  You have some Eustachian Tube Dysfunction, this problem is usually caused by some deeper sinus swelling and pressure, causing difficulty of eustachian tubes to clear fluid from behind ear drum. You can have ear pain, pressure, fullness, loss of hearing. Often related to sinus symptoms and sometimes with sinusitis or infection or allergy symptoms.  Treatment: - Start using nasal steroid spray, Flonase 2 sprays in each nostril every day for at least 4-6 weeks, and maybe longer - Start OTC allergy medicine (Claritin, Zyrtec, or Allegra - or generics) once daily - For significant ear/sinus pressure, try OTC decongestant such as Sudafed (pseudephedrine) behind the counter) - AVOID Afrin nasal spray, if you use this do not use more than 3 days or can make sinus swelling worse  If any significant worsening, loss of hearing, constant pain, fever/chills, or concern for infection - notify office and we can send in an antibiotic. Or if just persistent pressure that is not improving, you may contact me back within 1 week and we can consider a brief course of oral steroid prednisone for 3 days only to help reduce swelling, as discussed this is not ideal treatment and can cause side effects.  If not improved 48-72 hours Start Azithromycin Z pak (antibiotic) 2 tabs day 1, then 1 tab x 4 days, complete entire course even if improved   Please schedule a Follow-up Appointment to: Return if symptoms worsen or fail to improve.  If you have any other questions or concerns, please feel free to call the office or send a message through MyChart. You may also schedule an earlier appointment if necessary.  Additionally, you may be receiving a survey about your experience at our office within a few days to 1 week by e-mail or mail. We value your  feedback.  Saralyn Pilar, DO [facility], [place]

## 2024-01-16 NOTE — Progress Notes (Signed)
Subjective:    Patient ID: Regina Huber, female    DOB: Apr 03, 1961, 63 y.o.   MRN: [id number]  Regina Huber is a 63 y.o. female presenting on 01/16/2024 for Ear Pain and Sinusitis  Patient presents for a same day appointment.  PCP Nicki Reaper, FNP   HPI  Discussed the use of AI scribe software for clinical note transcription with the patient, who gave verbal consent to proceed.  History of Present Illness    The patient is a 63 year old female who presents with right ear pressure and pain.  She experiences pressure and pain primarily in her right ear, with some sensation in the left ear as well. She took over-the-counter Sudafed and Tylenol about an hour before the visit. The symptoms include pressure, pain, and a fluid sensation, and she notes that she 'just doesn't feel well.'  She recalls a similar episode about a year ago that resulted in an ear infection. Her daughter experienced a similar condition about six weeks ago, requiring two rounds of Z-Pak and ear drops. No significant sinus issues, lower chest symptoms, or breathing difficulties are present, although there is a slight scratchiness in her throat. She feels a swollen gland on the right side of her neck.  She recently completed a Medrol Dosepak for nerve inflammation in her second toe, finishing the course about ten days ago. She has allergies to doxycycline and penicillin and typically uses a Z-Pak for infections due to these allergies.           12/24/2023    8:47 AM 01/27/2023   11:01 AM 05/28/2022    9:25 AM  Depression screen PHQ 2/9  Decreased Interest 0 0 0  Down, Depressed, Hopeless 1 0 0  PHQ - 2 Score 1 0 0  Altered sleeping  0 1  Tired, decreased energy  1 1  Change in appetite  1 0  Feeling bad or failure about yourself   0 0  Trouble concentrating  0 0  Moving slowly or fidgety/restless  0 0  Suicidal thoughts   0  PHQ-9 Score  2 2  Difficult doing work/chores  Not difficult at all Not difficult  at all       12/24/2023    8:47 AM 01/27/2023   11:01 AM 05/28/2022    9:25 AM 05/15/2021    8:40 AM  GAD 7 : Generalized Anxiety Score  Nervous, Anxious, on Edge 0 0 0 1  Control/stop worrying 0 0 0 0  Worry too much - different things 0 0 0 0  Trouble relaxing 0 0 0 1  Restless 0 0 0 0  Easily annoyed or irritable 0 0 0 0  Afraid - awful might happen 0 0 0 0  Total GAD 7 Score 0 0 0 2  Anxiety Difficulty Not difficult at all Not difficult at all Not difficult at all Not difficult at all    Social History   Tobacco Use   Smoking status: Never   Smokeless tobacco: Never  Vaping Use   Vaping status: Never Used  Substance Use Topics   Alcohol use: Yes    Comment: rarely   Drug use: No    Review of Systems Per HPI unless specifically indicated above     Objective:    BP 132/70   Pulse 79   Temp 98.7 F (37.1 C)   Ht 5\' 8"  (1.727 m)   Wt 200 lb (90.7 kg)   SpO2  96%   BMI 30.41 kg/m   Wt Readings from Last 3 Encounters:  01/16/24 200 lb (90.7 kg)  12/24/23 202 lb (91.6 kg)  10/20/23 196 lb 12.8 oz (89.3 kg)    Physical Exam Vitals and nursing note reviewed.  Constitutional:      General: She is not in acute distress.    Appearance: She is well-developed. She is not diaphoretic.     Comments: Well-appearing, comfortable, cooperative  HENT:     Head: Normocephalic and atraumatic.     Right Ear: There is no impacted cerumen.     Left Ear: Tympanic membrane, ear canal and external ear normal. There is no impacted cerumen.     Ears:     Comments: R TM with fullness effusion bulging. No erythema.    Nose: Congestion present.  Eyes:     General:        Right eye: No discharge.        Left eye: No discharge.     Conjunctiva/sclera: Conjunctivae normal.  Neck:     Thyroid: No thyromegaly.  Cardiovascular:     Rate and Rhythm: Normal rate and regular rhythm.     Heart sounds: Normal heart sounds. No murmur heard. Pulmonary:     Effort: Pulmonary effort is  normal. No respiratory distress.     Breath sounds: Normal breath sounds. No wheezing or rales.  Musculoskeletal:        General: Normal range of motion.     Cervical back: Normal range of motion and neck supple.  Lymphadenopathy:     Cervical: No cervical adenopathy.  Skin:    General: Skin is warm and dry.     Findings: No erythema or rash.  Neurological:     Mental Status: She is alert and oriented to person, place, and time.  Psychiatric:        Behavior: Behavior normal.     Comments: Well groomed, good eye contact, normal speech and thoughts     Results for orders placed or performed in visit on 12/24/23  CBC   Collection Time: 12/24/23  8:59 AM  Result Value Ref Range   WBC 8.2 3.8 - 10.8 Thousand/uL   RBC 4.98 3.80 - 5.10 Million/uL   Hemoglobin 13.8 11.7 - 15.5 g/dL   HCT 16.1 09.6 - 04.5 %   MCV 86.3 80.0 - 100.0 fL   MCH 27.7 27.0 - 33.0 pg   MCHC 32.1 32.0 - 36.0 g/dL   RDW 40.9 81.1 - 91.4 %   Platelets 290 140 - 400 Thousand/uL   MPV 10.3 7.5 - 12.5 fL  COMPLETE METABOLIC PANEL WITH GFR   Collection Time: 12/24/23  8:59 AM  Result Value Ref Range   Glucose, Bld 88 65 - 99 mg/dL   BUN 8 7 - 25 mg/dL   Creat 7.82 9.56 - 2.13 mg/dL   eGFR 81 > OR = 60 YQ/MVH/8.46N6   BUN/Creatinine Ratio SEE NOTE: 6 - 22 (calc)   Sodium 141 135 - 146 mmol/L   Potassium 3.8 3.5 - 5.3 mmol/L   Chloride 102 98 - 110 mmol/L   CO2 29 20 - 32 mmol/L   Calcium 9.4 8.6 - 10.4 mg/dL   Total Protein 6.8 6.1 - 8.1 g/dL   Albumin 4.2 3.6 - 5.1 g/dL   Globulin 2.6 1.9 - 3.7 g/dL (calc)   AG Ratio 1.6 1.0 - 2.5 (calc)   Total Bilirubin 0.5 0.2 - 1.2 mg/dL   Alkaline phosphatase (  APISO) 85 37 - 153 U/L   AST 18 10 - 35 U/L   ALT 20 6 - 29 U/L  Lipid panel   Collection Time: 12/24/23  8:59 AM  Result Value Ref Range   Cholesterol 242 (H) <200 mg/dL   HDL 59 > OR = 50 mg/dL   Triglycerides 161 (H) <150 mg/dL   LDL Cholesterol (Calc) 148 (H) mg/dL (calc)   Total CHOL/HDL Ratio 4.1  <5.0 (calc)   Non-HDL Cholesterol (Calc) 183 (H) <130 mg/dL (calc)  Hemoglobin W9U   Collection Time: 12/24/23  8:59 AM  Result Value Ref Range   Hgb A1c MFr Bld 6.1 (H) <5.7 % of total Hgb   Mean Plasma Glucose 128 mg/dL   eAG (mmol/L) 7.1 mmol/L  Uric acid   Collection Time: 12/24/23  8:59 AM  Result Value Ref Range   Uric Acid, Serum 7.8 (H) 2.5 - 7.0 mg/dL      Assessment & Plan:   Problem List Items Addressed This Visit   None Visit Diagnoses       Acute non-recurrent frontal sinusitis    -  Primary   Relevant Medications   fluticasone (FLONASE) 50 MCG/ACT nasal spray   azithromycin (ZITHROMAX Z-PAK) 250 MG tablet     Eustachian tube dysfunction, right            Sinusitis Eustachian Tube Dysfunction R>L Right ear pressure and fluid build-up, with some fluid in the left ear. No signs of infection yet. Patient has been taking over-the-counter Sudafed and Tylenol.  -Start Flonase (two sprays each side daily) to reduce sinus swelling and promote fluid drainage. -Consider adding an antihistamine (Claritin, Zyrtec, Allegra) to reduce body's response to pressure and swelling. -Upgrade to behind-the-counter Sudafed for better decongestion than Phenylephrine -Order Z-Pak (Azithromycin) as a backup plan, to be started if symptoms do not improve in 48-72 hours or if patient feels it is necessary. -Consider a low-dose steroid as a future backup plan if symptoms do not improve by next week. But note cannot order this since she JUST finished steroid for her foot about 10 days ago      No orders of the defined types were placed in this encounter.   Meds ordered this encounter  Medications   fluticasone (FLONASE) 50 MCG/ACT nasal spray    Sig: Place 2 sprays into both nostrils daily. Use for 4-6 weeks then stop and use seasonally or as needed.    Dispense:  16 g    Refill:  0   azithromycin (ZITHROMAX Z-PAK) 250 MG tablet    Sig: Take 2 tabs (500mg  total) on Day 1. Take 1  tab (250mg ) daily for next 4 days.    Dispense:  6 tablet    Refill:  0    Follow up plan: Return if symptoms worsen or fail to improve.   Saralyn Pilar, DO [facility] 01/16/2024, 2:07 PM

## 2024-01-16 NOTE — Telephone Encounter (Signed)
Requested Prescriptions  Pending Prescriptions Disp Refills   rosuvastatin (CRESTOR) 5 MG tablet [Pharmacy Med Name: ROSUVASTATIN CALCIUM 5 MG TAB] 90 tablet 1    Sig: TAKE 1 TABLET (5 MG TOTAL) BY MOUTH DAILY.     Cardiovascular:  Antilipid - Statins 2 Failed - 01/16/2024 12:13 PM      Failed - Lipid Panel in normal range within the last 12 months    Cholesterol  Date Value Ref Range Status  12/24/2023 242 (H) <200 mg/dL Final   LDL Cholesterol (Calc)  Date Value Ref Range Status  12/24/2023 148 (H) mg/dL (calc) Final    Comment:    Reference range: <100 . Desirable range <100 mg/dL for primary prevention;   <70 mg/dL for patients with CHD or diabetic patients  with > or = 2 CHD risk factors. Marland Kitchen LDL-C is now calculated using the Martin-Hopkins  calculation, which is a validated novel method providing  better accuracy than the Friedewald equation in the  estimation of LDL-C.  Horald Pollen et al. Lenox Ahr. 4098;119(14): 2061-2068  (http://education.[employer].com/faq/FAQ164)    Direct LDL  Date Value Ref Range Status  03/25/2016 116.0 mg/dL Final    Comment:    Optimal:  <100 mg/dLNear or Above Optimal:  100-129 mg/dLBorderline High:  130-159 mg/dLHigh:  160-189 mg/dLVery High:  >190 mg/dL   HDL  Date Value Ref Range Status  12/24/2023 59 > OR = 50 mg/dL Final   Triglycerides  Date Value Ref Range Status  12/24/2023 210 (H) <150 mg/dL Final    Comment:    . If a non-fasting specimen was collected, consider repeat triglyceride testing on a fasting specimen if clinically indicated.  Perry Mount et al. J. of Clin. Lipidol. 2015;9:129-169. Marland Kitchen          Passed - Cr in normal range and within 360 days    Creat  Date Value Ref Range Status  12/24/2023 0.82 0.50 - 1.05 mg/dL Final         Passed - Patient is not pregnant      Passed - Valid encounter within last 12 months    Recent Outpatient Visits           3 weeks ago Prediabetes   [place] [facility] [place], Salvadore Oxford, NP   11 months ago Wheezing on expiration   [place] [facility] [place], Oswaldo [place], [place]   11 months ago Acute bacterial bronchitis   [place] [facility] [place], Salvadore Oxford, NP   1 year ago Acute cough   [place] [facility] [place], Salvadore Oxford, NP   1 year ago Viral respiratory illness   [place] [facility] [place], Salvadore Oxford, NP       Future Appointments             Today Althea Charon, Netta Neat, DO [facility], [facility]

## 2024-01-17 ENCOUNTER — Other Ambulatory Visit (HOSPITAL_COMMUNITY): Payer: Self-pay

## 2024-01-19 ENCOUNTER — Encounter: Payer: Self-pay | Admitting: Family Medicine

## 2024-01-19 ENCOUNTER — Other Ambulatory Visit: Payer: Self-pay

## 2024-01-19 ENCOUNTER — Other Ambulatory Visit (HOSPITAL_COMMUNITY): Payer: Self-pay

## 2024-01-19 MED ORDER — PREDNISONE 10 MG PO TABS: ORAL_TABLET | ORAL | 0 refills | Status: DC

## 2024-01-19 MED ORDER — ROSUVASTATIN CALCIUM 5 MG PO TABS
5.0000 mg | ORAL_TABLET | Freq: Every day | ORAL | 1 refills | Status: DC
  Filled 2024-01-19: qty 90, 90d supply, fill #0
  Filled 2024-04-14: qty 90, 90d supply, fill #1

## 2024-01-19 MED ORDER — PREDNISONE 10 MG PO TABS
ORAL_TABLET | ORAL | 0 refills | Status: DC
  Filled 2024-01-19: qty 21, fill #0

## 2024-01-20 ENCOUNTER — Other Ambulatory Visit: Payer: Self-pay

## 2024-01-22 ENCOUNTER — Encounter: Payer: Self-pay | Admitting: Internal Medicine

## 2024-01-22 DIAGNOSIS — H9203 Otalgia, bilateral: Secondary | ICD-10-CM

## 2024-01-27 ENCOUNTER — Ambulatory Visit
Admission: EM | Admit: 2024-01-27 | Discharge: 2024-01-27 | Disposition: A | Discharge status: Home / Self Care | Payer: 59 | Attending: Emergency Medicine | Admitting: Emergency Medicine

## 2024-01-27 ENCOUNTER — Encounter: Payer: Self-pay | Admitting: Emergency Medicine

## 2024-01-27 ENCOUNTER — Ambulatory Visit (INDEPENDENT_AMBULATORY_CARE_PROVIDER_SITE_OTHER): Payer: 59

## 2024-01-27 DIAGNOSIS — J111 Influenza due to unidentified influenza virus with other respiratory manifestations: Secondary | ICD-10-CM | POA: Diagnosis not present

## 2024-01-27 DIAGNOSIS — J101 Influenza due to other identified influenza virus with other respiratory manifestations: Secondary | ICD-10-CM

## 2024-01-27 DIAGNOSIS — R509 Fever, unspecified: Secondary | ICD-10-CM | POA: Diagnosis not present

## 2024-01-27 DIAGNOSIS — R051 Acute cough: Secondary | ICD-10-CM

## 2024-01-27 DIAGNOSIS — H9203 Otalgia, bilateral: Secondary | ICD-10-CM

## 2024-01-27 DIAGNOSIS — R059 Cough, unspecified: Secondary | ICD-10-CM | POA: Diagnosis not present

## 2024-01-27 LAB — RESP PANEL BY RT-PCR (FLU A&B, COVID) ARPGX2
Influenza A by PCR: POSITIVE — AB
Influenza B by PCR: NEGATIVE
SARS Coronavirus 2 by RT PCR: NEGATIVE

## 2024-01-27 MED ORDER — HYDROCOD POLI-CHLORPHE POLI ER 10-8 MG/5ML PO SUER
5.0000 mL | Freq: Two times a day (BID) | ORAL | 0 refills | Status: DC | PRN
Start: 2024-01-27 — End: 2024-03-23

## 2024-01-27 MED ORDER — IBUPROFEN 600 MG PO TABS: 600.0000 mg | ORAL_TABLET | Freq: Three times a day (TID) | ORAL | 0 refills | Status: DC | PRN

## 2024-01-27 MED ORDER — AEROCHAMBER MV MISC: 1 refills | Status: DC

## 2024-01-27 MED ORDER — OSELTAMIVIR PHOSPHATE 75 MG PO CAPS: 75.0000 mg | ORAL_CAPSULE | Freq: Two times a day (BID) | ORAL | 0 refills | Status: DC

## 2024-01-27 MED ORDER — ALBUTEROL SULFATE HFA 108 (90 BASE) MCG/ACT IN AERS: 1.0000 | INHALATION_SPRAY | RESPIRATORY_TRACT | 0 refills | Status: DC | PRN

## 2024-01-27 NOTE — ED Triage Notes (Signed)
Pt c/o R ear pain,chest congestion & HA x5 days. Was seen by PCP on 1/31 was given Zpack,prednisone & flonase w/o relief.

## 2024-01-27 NOTE — Discharge Instructions (Addendum)
I did not appreciate any pneumonia on your x-ray.  We will contact you if the radiology overread differs enough from mine and we need to change management.  We will contact you if your flu or COVID come back positive and prescribe the appropriate therapy.  In the meantime, continue Flonase.  Discontinue the antihistamine and start Mucinex D.  Saline nasal irrigation with a NeilMed sinus rinse and distilled water as often as you want.  You can take 400-600 mg of ibuprofen combined with 1000 mg of Tylenol 3-4 times a day as needed for pain and fever.  Take two puffs from your albuterol inhaler every 4 hours for 2 days, then every 6 hours for 2 days, then as needed. You can back off if you start to improve  sooner.   If the spacer is too expensive at the pharmacy, you can get an AeroChamber Z-Stat off of [employer] for about $10-$15.  Go to www.goodrx.com  or www.costplusdrugs.com to look up your medications. This will give you a list of where you can find your prescriptions at the most affordable prices. Or ask the pharmacist what the cash price is, or if they have any other discount programs available to help make your medication more affordable. This can be less expensive than what you would pay with insurance.

## 2024-01-27 NOTE — ED Provider Notes (Signed)
HPI  SUBJECTIVE:  Regina Huber is a 63 y.o. female who presents with bilateral ear pain worse on the right that restarted 2 days ago.  She reports a headache, feeling feverish, cough productive of greenish sputum, shortness of breath, dyspnea on exertion, fatigue, difficulty sleeping at night because of the cough.  No facial rash, body aches, nasal congestion, rhinorrhea, sinus pain or pressure, postnasal drip, wheezing.  No known COVID or flu exposure.  She did not get the COVID-vaccine, but got this years flu vaccine.  She has been taking Tylenol, last dose was within 6 hours of evaluation, Flonase, and DayQuil with improvement in her symptoms.  Symptoms are worse when she does not take medications.  She was seen by her PCP on 1/31 and diagnosed with sinusitis, eustachian tube dysfunction worse on the right.  She was sent home with Sudafed, Tylenol, Flonase, advised to start an antihistamine and a Z-Pak if symptoms did not improve in 48 to 72 hours.  She was prescribed a Medrol Dosepak on 2/3.  She finished the Z-Pak, steroids, has been taking Tylenol, Flonase and Sudafed with improvement in her ear pain.  She also tried an unknown leftover prescription eardrop without relief.  She finished the prednisone 4 days ago. Patient has a past medical history of hypertension, hyperlipidemia, myasthenia gravis varicella, and grinds her teeth at night.  She is allergic to penicillins, but tolerates cephalosporins and reports thrush with doxycycline.  No anaphylaxis with either medication.  PCP: Lovie Macadamia.  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 pt states she received 9 pints of blood   Vaginal intraepithelial neoplasia III (VAIN III)     Past Surgical History:  Procedure Laterality Date   ABDOMINAL HYSTERECTOMY  05-07-2000   w/  Right Ovarian Cystectomy   CARDIOVASCULAR STRESS TEST  12-11-2016  dr Verdis Prime   normal nuclera study w/ no ischemia/  normal LV function and wall motion, nuclear stress ef 59% (BP demonstrated hypertensive response to exercise)   CESAREAN SECTION  1988  &  1995   CO2 LASER APPLICATION N/A 01/04/2016   Procedure: CO2 LASER VAPORIZATION OF THE VAGINA;  Surgeon: Adolphus Birchwood, MD;  Location: [facility];  Service: Gynecology;  Laterality: N/A;   CO2 LASER APPLICATION N/A 09/11/2017   Procedure: CO2 LASER VAPORIZATION OF THE VAGINA;  Surgeon: Adolphus Birchwood, MD;  Location: [facility];  Service: Gynecology;  Laterality: N/A;   COMBINED ABDOMINOPLASTY AND LIPOSUCTION  03-12-2002   LAPAROTOMY/  SUTURE INTRAPERITONEAL BLEEDING  05-08-2000   LUMBAR DISC SURGERY  11-07-2010   L5 - S1   TRANSSTERNAL THYMECTOMY  04-29-2005   TRANSTHORACIC ECHOCARDIOGRAM  12-11-2016  dr Verdis Prime   grade 1 diastolic dysfunction, ef 55-60%/  trivial MR, PR, and TR    Family History  Problem Relation Age of Onset   Hypertension Mother  Diabetes Mother    Breast cancer Mother    Alcohol abuse Father    Hypertension Father    Stroke Father    Colon cancer Father 30   Heart disease Brother 20       MI   Colon polyps Brother    Colon polyps Sister    Colon polyps Sister    Colon polyps Brother    Esophageal cancer Neg Hx    Rectal cancer Neg Hx    Stomach cancer Neg Hx     Social History   Tobacco Use   Smoking status: Never   Smokeless tobacco: Never  Vaping Use   Vaping status: Never Used  Substance Use Topics   Alcohol use: Yes    Comment: rarely   Drug use: No    No current  facility-administered medications for this encounter.  Current Outpatient Medications:    albuterol (VENTOLIN HFA) 108 (90 Base) MCG/ACT inhaler, Inhale 1-2 puffs into the lungs every 4 (four) hours as needed for wheezing or shortness of breath., Disp: 1 each, Rfl: 0   allopurinol (ZYLOPRIM) 100 MG tablet, Take 1 tablet (100 mg total) by mouth daily., Disp: 90 tablet, Rfl: 1   [Paused] ALPRAZolam (XANAX) 0.25 MG tablet, Take 1 tablet (0.25 mg total) by mouth daily as needed for anxiety., Disp: 20 tablet, Rfl: 0   amLODipine (NORVASC) 5 MG tablet, Take 1 tablet (5 mg total) by mouth daily., Disp: 90 tablet, Rfl: 1   chlorpheniramine-HYDROcodone (TUSSIONEX) 10-8 MG/5ML, Take 5 mLs by mouth every 12 (twelve) hours as needed for cough., Disp: 60 mL, Rfl: 0   citalopram (CELEXA) 40 MG tablet, Take 1 tablet (40 mg total) by mouth daily., Disp: 90 tablet, Rfl: 1   colchicine 0.6 MG tablet, TAKE 1 TABLET BY MOUTH 2 TIMES DAILY FOR 7 - 10 DAYS THEN TAKE AS DIRECTED AS NEEDED FOR ATTACKS., Disp: 90 tablet, Rfl: 1   hydrochlorothiazide (HYDRODIURIL) 25 MG tablet, Take 1 tablet (25 mg total) by mouth daily., Disp: 90 tablet, Rfl: 0   ibuprofen (ADVIL) 600 MG tablet, Take 1 tablet (600 mg total) by mouth every 8 (eight) hours as needed., Disp: 30 tablet, Rfl: 0   Multiple Vitamin (MULTIVITAMIN) tablet, Take 1 tablet by mouth daily., Disp: , Rfl:    oseltamivir (TAMIFLU) 75 MG capsule, Take 1 capsule (75 mg total) by mouth 2 (two) times daily. X 5 days, Disp: 10 capsule, Rfl: 0   rosuvastatin (CRESTOR) 5 MG tablet, Take 1 tablet (5 mg total) by mouth daily., Disp: 90 tablet, Rfl: 1   Spacer/Aero-Holding Chambers (AEROCHAMBER MV) inhaler, Use as instructed, Disp: 1 each, Rfl: 1   traZODone (DESYREL) 50 MG tablet, Take 1 tablet (50 mg total) by mouth at bedtime., Disp: 90 tablet, Rfl: 0   fluticasone (FLONASE) 50 MCG/ACT nasal spray, Place 2 sprays into both nostrils daily. Use for 4-6 weeks then stop and use  seasonally or as needed., Disp: 16 g, Rfl: 0  Allergies  Allergen Reactions   Doxycycline Other (See Comments)    Oral blisters   Penicillins Rash    - has tolerated cephalosporins     ROS  As noted in HPI.   Physical Exam  BP (!) 149/91 (BP Location: Left Arm)   Pulse 83   Temp 100.3 F (37.9 C) (Oral)   Resp 16   Ht 5\' 7"  (1.702 m)   Wt 86.2 kg   SpO2 98%   BMI 29.76 kg/m   Constitutional:  Well developed, well nourished, no acute distress.  Coughing. Eyes: PERRL, EOMI, conjunctiva normal bilaterally HENT: Normocephalic, atraumatic,mucus membranes moist.  bilateral external ears, EACs, TMs normal.  No pain with traction on pinna, palpation of tragus or mastoid.  TMJ nontender bilaterally.  Positive crepitus.  Nasal congestion.  Normal turbinates.  Mild maxillary sinus tenderness.  Normal oropharynx. Neck: No appreciable cervical lymphadenopathy Respiratory: Clear to auscultation bilaterally, no rales, no wheezing, no rhonchi.  No anterior, lateral chest wall tenderness Cardiovascular: Normal rate and rhythm, no murmurs, no gallops, no rubs GI: Nondistended skin: No facial rash, skin intact Musculoskeletal:  no deformities Neurologic: Alert & oriented x 3, CN III-XII grossly intact, no motor deficits, sensation grossly intact Psychiatric: Speech and behavior appropriate   ED Course   Medications - No data to display  Orders Placed This Encounter  Procedures   Resp Panel by RT-PCR (Flu A&B, Covid) Anterior Nasal Swab    Standing Status:   Standing    Number of Occurrences:   1   DG Chest 2 View    Standing Status:   Standing    Number of Occurrences:   1    Reason for Exam (SYMPTOM  OR DIAGNOSIS REQUIRED):   cough fever r/o PNA   Results for orders placed or performed during the hospital encounter of 01/27/24 (from the past 24 hours)  Resp Panel by RT-PCR (Flu A&B, Covid) Anterior Nasal Swab     Status: Abnormal   Collection Time: 01/27/24  4:25 PM    Specimen: Anterior Nasal Swab  Result Value Ref Range   SARS Coronavirus 2 by RT PCR NEGATIVE NEGATIVE   Influenza A by PCR POSITIVE (A) NEGATIVE   Influenza B by PCR NEGATIVE NEGATIVE   DG Chest 2 View Result Date: 01/27/2024 CLINICAL DATA:  Cough, fever EXAM: CHEST - 2 VIEW COMPARISON:  01/27/2023 FINDINGS: Frontal and lateral views of the chest demonstrate postsurgical changes from median sternotomy. The cardiac silhouette is unremarkable. No acute airspace disease, effusion, or pneumothorax. No acute bony abnormalities. IMPRESSION: 1. Stable chest, no acute process. Electronically Signed   By: Sharlet Salina M.D.   On: 01/27/2024 17:33    ED Clinical Impression  1. Influenza A   2. Acute cough   3. Influenza-like illness   4. Otalgia of both ears      ED Assessment/Plan     1.  Bilateral otalgia.  No evidence of otitis media, otitis externa, TMJ arthralgia, shingles.  Unsure as to the etiology of the patient's ear pain.  Could be eustachian tube dysfunction.  She is to continue Flonase for this.  2.  New flulike symptoms.  Has a low-grade fever here 100.3, and took Tylenol an hour and a half ago.  Checking chest x-ray, COVID and flu.  Will contact patient at [phone] if any of these are abnormal and we will prescribe the appropriate therapy.  In the meantime, saline nasal irrigation, discontinue the antihistamine, start Mucinex D, regularly scheduled albuterol inhaler with a spacer for 4 days, then as needed thereafter.  Tussionex for cough.  I considered steroids, but given that she just finished a Medrol Dosepak 4 days ago,I am hesitant to prescribe another 1.  Influenza A positive.  Sent patient MyChart note.  E prescribed Tamiflu to pharmacy on record.  Will also have staff reach out to her and notify her of positive results/prescription waiting for her at the pharmacy.  [place] [facility]arcotic database reviewed for this patient, and feel that the  risk/benefit ratio today is favorable  for proceeding with a prescription for controlled substance.  No opiate prescriptions in the past 2 years.  Patient is prescribed Xanax.  Will advise her to not take the Xanax if taking Tussionex.  Reviewed imaging independently.  No pneumonia.  Formal radiology report pending.  Reviewed radiology report..  No pneumonia consistent with my read.  See radiology report for full details.  Discussed labs, imaging, MDM, treatment plan, and plan for follow-up with patient.. patient agrees with plan.   Meds ordered this encounter  Medications   chlorpheniramine-HYDROcodone (TUSSIONEX) 10-8 MG/5ML    Sig: Take 5 mLs by mouth every 12 (twelve) hours as needed for cough.    Dispense:  60 mL    Refill:  0   albuterol (VENTOLIN HFA) 108 (90 Base) MCG/ACT inhaler    Sig: Inhale 1-2 puffs into the lungs every 4 (four) hours as needed for wheezing or shortness of breath.    Dispense:  1 each    Refill:  0   Spacer/Aero-Holding Chambers (AEROCHAMBER MV) inhaler    Sig: Use as instructed    Dispense:  1 each    Refill:  1   ibuprofen (ADVIL) 600 MG tablet    Sig: Take 1 tablet (600 mg total) by mouth every 8 (eight) hours as needed.    Dispense:  30 tablet    Refill:  0   oseltamivir (TAMIFLU) 75 MG capsule    Sig: Take 1 capsule (75 mg total) by mouth 2 (two) times daily. X 5 days    Dispense:  10 capsule    Refill:  0      *This clinic note was created using Scientist, clinical (histocompatibility and immunogenetics). Therefore, there may be occasional mistakes despite careful proofreading. ?    Domenick Gong, MD 01/30/24 [phone]

## 2024-02-07 ENCOUNTER — Other Ambulatory Visit: Payer: Self-pay | Admitting: Family Medicine

## 2024-02-07 DIAGNOSIS — J0110 Acute frontal sinusitis, unspecified: Secondary | ICD-10-CM

## 2024-02-10 NOTE — Telephone Encounter (Signed)
 Requested Prescriptions  Pending Prescriptions Disp Refills   fluticasone (FLONASE) 50 MCG/ACT nasal spray [Pharmacy Med Name: FLUTICASONE PROP 50 MCG SPRAY] 48 mL 1    Sig: Place 2 sprays into both nostrils daily. Use for 4-6 weeks then stop and use seasonally or as needed.     Ear, Nose, and Throat: Nasal Preparations - Corticosteroids Passed - 02/10/2024  7:37 AM      Passed - Valid encounter within last 12 months    Recent Outpatient Visits           3 weeks ago Acute non-recurrent frontal sinusitis   [facility] Smitty Cords, DO   1 month ago Prediabetes   [facility] [place], Salvadore Oxford, NP   1 year ago Wheezing on expiration   [facility] [place], Oswaldo Conroy, [place]   1 year ago Acute bacterial bronchitis   [facility] [place], Salvadore Oxford, NP   1 year ago Acute cough   [facility] [place], Salvadore Oxford, [place]

## 2024-02-23 ENCOUNTER — Other Ambulatory Visit (HOSPITAL_COMMUNITY): Payer: Self-pay

## 2024-02-23 ENCOUNTER — Other Ambulatory Visit: Payer: Self-pay | Admitting: Internal Medicine

## 2024-02-23 ENCOUNTER — Other Ambulatory Visit: Payer: Self-pay

## 2024-02-23 DIAGNOSIS — F32A Depression, unspecified: Secondary | ICD-10-CM

## 2024-02-24 ENCOUNTER — Other Ambulatory Visit (HOSPITAL_COMMUNITY): Payer: Self-pay

## 2024-02-24 ENCOUNTER — Other Ambulatory Visit: Payer: Self-pay

## 2024-02-24 MED ORDER — ALPRAZOLAM 0.25 MG PO TABS
0.2500 mg | ORAL_TABLET | Freq: Every day | ORAL | 0 refills | Status: DC | PRN
  Filled 2024-02-24: qty 20, 20d supply, fill #0

## 2024-02-24 NOTE — Telephone Encounter (Signed)
 Requested medication (s) are due for refill today - provider review   Requested medication (s) are on the active medication list -yes  Future visit scheduled -no  Last refill: 12/19/23 #20  Notes to clinic: non delegated Rx  Requested Prescriptions  Pending Prescriptions Disp Refills   ALPRAZolam (XANAX) 0.25 MG tablet 20 tablet 0    Sig: Take 1 tablet (0.25 mg total) by mouth daily as needed for anxiety.     Not Delegated - Psychiatry: Anxiolytics/Hypnotics 2 Failed - 02/24/2024 11:33 AM      Failed - This refill cannot be delegated      Failed - Urine Drug Screen completed in last 360 days      Passed - Patient is not pregnant      Passed - Valid encounter within last 6 months    Recent Outpatient Visits           1 month ago Acute non-recurrent frontal sinusitis   [facility] Smitty Cords, DO   2 months ago Prediabetes   [facility] [place], Salvadore Oxford, NP   1 year ago Wheezing on expiration   [facility] [place], Oswaldo Conroy, [place]   1 year ago Acute bacterial bronchitis   [facility] [place], Salvadore Oxford, NP   1 year ago Acute cough   [facility] [place], Salvadore Oxford, NP                 Requested Prescriptions  Pending Prescriptions Disp Refills   ALPRAZolam (XANAX) 0.25 MG tablet 20 tablet 0    Sig: Take 1 tablet (0.25 mg total) by mouth daily as needed for anxiety.     Not Delegated - Psychiatry: Anxiolytics/Hypnotics 2 Failed - 02/24/2024 11:33 AM      Failed - This refill cannot be delegated      Failed - Urine Drug Screen completed in last 360 days      Passed - Patient is not pregnant      Passed - Valid encounter within last 6 months    Recent Outpatient Visits           1 month ago Acute non-recurrent frontal sinusitis   [facility] Smitty Cords, DO   2 months ago Prediabetes    [facility] [place], Salvadore Oxford, NP   1 year ago Wheezing on expiration   [facility] [place], Oswaldo Conroy, [place]   1 year ago Acute bacterial bronchitis   [place] [facility] [place], Salvadore Oxford, NP   1 year ago Acute cough   [facility] [place], Salvadore Oxford, [place]

## 2024-03-17 ENCOUNTER — Other Ambulatory Visit: Payer: Self-pay | Admitting: Internal Medicine

## 2024-03-17 ENCOUNTER — Other Ambulatory Visit (HOSPITAL_COMMUNITY): Payer: Self-pay

## 2024-03-17 ENCOUNTER — Other Ambulatory Visit: Payer: Self-pay

## 2024-03-17 DIAGNOSIS — F419 Anxiety disorder, unspecified: Secondary | ICD-10-CM

## 2024-03-17 DIAGNOSIS — F32A Depression, unspecified: Secondary | ICD-10-CM

## 2024-03-18 NOTE — Telephone Encounter (Signed)
 Requested medications are due for refill today.  yes  Requested medications are on the active medications list.  yes  Last refill. 02/24/2024 #20 0 rf  Future visit scheduled.   no  Notes to clinic.  Refill not delegated.    Requested Prescriptions  Pending Prescriptions Disp Refills   ALPRAZolam (XANAX) 0.25 MG tablet 20 tablet 0    Sig: Take 1 tablet (0.25 mg total) by mouth daily as needed for anxiety.     Not Delegated - Psychiatry: Anxiolytics/Hypnotics 2 Failed - 03/18/2024  4:12 PM      Failed - This refill cannot be delegated      Failed - Urine Drug Screen completed in last 360 days      Failed - Valid encounter within last 6 months    Recent Outpatient Visits   None            Passed - Patient is not pregnant

## 2024-03-23 ENCOUNTER — Other Ambulatory Visit: Payer: Self-pay

## 2024-03-23 ENCOUNTER — Emergency Department (HOSPITAL_BASED_OUTPATIENT_CLINIC_OR_DEPARTMENT_OTHER)
Admission: EM | Admit: 2024-03-23 | Discharge: 2024-03-23 | Disposition: A | Discharge status: Home / Self Care | Attending: Emergency Medicine | Admitting: Emergency Medicine

## 2024-03-23 ENCOUNTER — Encounter (HOSPITAL_BASED_OUTPATIENT_CLINIC_OR_DEPARTMENT_OTHER): Payer: Self-pay

## 2024-03-23 ENCOUNTER — Emergency Department (HOSPITAL_BASED_OUTPATIENT_CLINIC_OR_DEPARTMENT_OTHER)

## 2024-03-23 ENCOUNTER — Ambulatory Visit: Payer: Self-pay

## 2024-03-23 DIAGNOSIS — R251 Tremor, unspecified: Secondary | ICD-10-CM | POA: Diagnosis not present

## 2024-03-23 DIAGNOSIS — R531 Weakness: Secondary | ICD-10-CM | POA: Diagnosis not present

## 2024-03-23 DIAGNOSIS — R519 Headache, unspecified: Secondary | ICD-10-CM | POA: Diagnosis not present

## 2024-03-23 DIAGNOSIS — I10 Essential (primary) hypertension: Secondary | ICD-10-CM | POA: Diagnosis not present

## 2024-03-23 DIAGNOSIS — R002 Palpitations: Secondary | ICD-10-CM | POA: Diagnosis not present

## 2024-03-23 DIAGNOSIS — F32A Anxiety disorder, unspecified: Secondary | ICD-10-CM

## 2024-03-23 LAB — HEPATIC FUNCTION PANEL
ALT: 25 U/L (ref 0–44)
AST: 22 U/L (ref 15–41)
Albumin: 4.5 g/dL (ref 3.5–5.0)
Alkaline Phosphatase: 79 U/L (ref 38–126)
Bilirubin, Direct: 0.1 mg/dL (ref 0.0–0.2)
Indirect Bilirubin: 0.4 mg/dL (ref 0.3–0.9)
Total Bilirubin: 0.5 mg/dL (ref 0.0–1.2)
Total Protein: 7.5 g/dL (ref 6.5–8.1)

## 2024-03-23 LAB — BASIC METABOLIC PANEL WITH GFR
Anion gap: 12 (ref 5–15)
BUN: 12 mg/dL (ref 8–23)
CO2: 27 mmol/L (ref 22–32)
Calcium: 9.5 mg/dL (ref 8.9–10.3)
Chloride: 99 mmol/L (ref 98–111)
Creatinine, Ser: 0.8 mg/dL (ref 0.44–1.00)
GFR, Estimated: >60 mL/min (ref >60)
Glucose, Bld: 95 mg/dL (ref 70–99)
Potassium: 3 mmol/L — ABNORMAL LOW (ref 3.5–5.1)
Sodium: 138 mmol/L (ref 135–145)

## 2024-03-23 LAB — CBG MONITORING, ED: Glucose-Capillary: 104 mg/dL — ABNORMAL HIGH (ref 70–99)

## 2024-03-23 LAB — CBC
HCT: 42.4 % (ref 36.0–46.0)
Hemoglobin: 14.5 g/dL (ref 12.0–15.0)
MCH: 28.2 pg (ref 26.0–34.0)
MCHC: 34.2 g/dL (ref 30.0–36.0)
MCV: 82.5 fL (ref 80.0–100.0)
Platelets: 328 10*3/uL (ref 150–400)
RBC: 5.14 MIL/uL — ABNORMAL HIGH (ref 3.87–5.11)
RDW: 13.4 % (ref 11.5–15.5)
WBC: 9.1 10*3/uL (ref 4.0–10.5)
nRBC: 0 % (ref 0.0–0.2)

## 2024-03-23 MED ORDER — LORAZEPAM 1 MG PO TABS: 0.5000 mg | ORAL_TABLET | Freq: Two times a day (BID) | ORAL | 0 refills | Status: DC | PRN

## 2024-03-23 MED ORDER — POTASSIUM CHLORIDE CRYS ER 20 MEQ PO TBCR: 20.0000 meq | EXTENDED_RELEASE_TABLET | Freq: Two times a day (BID) | ORAL | 0 refills | Status: DC

## 2024-03-23 MED ORDER — POTASSIUM CHLORIDE CRYS ER 20 MEQ PO TBCR
40.0000 meq | EXTENDED_RELEASE_TABLET | Freq: Once | ORAL | Status: AC
  Administered 2024-03-23: 40 meq via ORAL
  Filled 2024-03-23: qty 2

## 2024-03-23 MED ORDER — LORAZEPAM 2 MG/ML IJ SOLN
0.5000 mg | Freq: Once | INTRAMUSCULAR | Status: AC
  Administered 2024-03-23: 0.5 mg via INTRAVENOUS
  Filled 2024-03-23: qty 1

## 2024-03-23 NOTE — ED Triage Notes (Signed)
 Patient arrives with complaints of worsening shakes/tremors for one month. She is also concerned about her blood glucose being low. Also reports a decrease in her Celexa.

## 2024-03-23 NOTE — Telephone Encounter (Signed)
 Chief Complaint: shaking Symptoms: shaking, palpitations Frequency: since last night Pertinent Negatives: Patient denies sob Disposition: [x] ED /[] Urgent Care (no appt availability in office) / [] Appointment(In office/virtual)/ []  [place] [facility]/ [] Home Care/ [x] Refused Recommended Disposition /[] [facility] [facility]/ []  Follow-up with PCP Additional Notes: pt states that she just doesn't feel right. States that since last night she felt shaky, like her heart is racing and her blood pressure is off. States that she doesn't have a way to take her BP or heart rate. States that she ate this morning but just doesn't feel right. States she is trying to work at her computer and can't stop her hand from shaking enough to control mouse. Advise pt that I could call EMS pt refused and states she just wanted to see her PCP.  Advised against and asked was she home alone, pt states her daughter is there with her, advised to have her daughter take her to ED now, pt states she will have her daughter take her.   Copied from CRM [id number]. Topic: Clinical - Red Word Triage >> Mar 23, 2024 11:26 AM Elle L wrote: Red Word that prompted transfer to Nurse Triage: The patient states she is having issues with her blood pressure but she has been unable to get a reading on it. She is shaky, having heart palpitations, and she feels like her blood sugar has dropped. Reason for Disposition  Patient sounds very sick or weak to the triager  Answer Assessment - Initial Assessment Questions 1. BLOOD PRESSURE: "What is the blood pressure?" "Did you take at least two measurements 5 minutes apart?"     Doesn't have a way 2. ONSET: "When did you take your blood pressure?"     didn't 3. HOW: "How did you obtain the blood pressure?" (e.g., visiting nurse, automatic home BP monitor)     Doesn't have a way 4. HISTORY: "Do you have a history of low blood pressure?" "What is your blood pressure normally?"    Doesn't check 5.  MEDICINES: "Are you taking any medications for blood pressure?" If Yes, ask: "Have they been changed recently?"     no 6. PULSE RATE: "Do you know what your pulse rate is?"      Doesn't know but does  7. OTHER SYMPTOMS: "Have you been sick recently?" "Have you had a recent injury?" no  Protocols used: Blood Pressure - Low-A-AH

## 2024-03-23 NOTE — Discharge Instructions (Addendum)
 For tremors as we discussed.  Follow-up with neurology and primary care doctor.  I have sent potassium to your pharmacy as well.

## 2024-03-23 NOTE — ED Provider Notes (Signed)
 [place] EMERGENCY DEPARTMENT AT [facility] Provider Note   CSN: [id number] Arrival date & time: 03/23/24  1245     History  Chief Complaint  Patient presents with   Tremors    AMELIANNA Huber is a 63 y.o. female.  Patient here with tremors mostly in her hands.  Been ongoing for several weeks.  Nothing makes it worse or better.  Does not seem like it gets worse with movement or activity.  She does endorse fair amount of stress.  She recently decreased her citalopram to 20 mg daily instead of 40 mg daily.  She takes trazodone.  She is prescribed Xanax recently to help with stress and anxiety but she has not really been taking it.  She denies any headache chest pain shortness of breath weakness numbness tingling.  No issues walking around.  She denies any vision loss speech changes.  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  The history is provided by the patient.       Home Medications Prior to Admission medications   Medication Sig Start Date End Date Taking? Authorizing Provider  LORazepam (ATIVAN) 1 MG tablet Take 0.5 tablets (0.5 mg total) by mouth 2 (two) times daily as needed for anxiety. 03/23/24  Yes Kylee Nardozzi, DO  potassium chloride SA (KLOR-CON M) 20 MEQ tablet Take 1 tablet (20 mEq total) by mouth 2 (two) times daily. 03/23/24  Yes Laquashia Mergenthaler, DO  allopurinol (ZYLOPRIM) 100 MG tablet Take 1 tablet (100 mg total) by mouth daily. 12/24/23   Lorre Munroe, NP  amLODipine (NORVASC) 5 MG tablet Take 1 tablet (5 mg total) by mouth daily. 12/24/23   Lorre Munroe, NP  citalopram (CELEXA) 40 MG tablet Take 1 tablet (40 mg total) by mouth daily. 12/24/23   Lorre Munroe, NP  colchicine 0.6 MG tablet TAKE 1 TABLET BY MOUTH 2 TIMES DAILY FOR 7 - 10 DAYS THEN TAKE AS DIRECTED AS NEEDED FOR ATTACKS. 10/10/23 10/09/24  Lorre Munroe, NP  hydrochlorothiazide (HYDRODIURIL) 25 MG tablet Take 1 tablet (25 mg total) by mouth daily.  12/24/23   Lorre Munroe, NP  ibuprofen (ADVIL) 600 MG tablet Take 1 tablet (600 mg total) by mouth every 8 (eight) hours as needed. 01/27/24   Domenick Gong, MD  Multiple Vitamin (MULTIVITAMIN) tablet Take 1 tablet by mouth daily.    [provider]  rosuvastatin (CRESTOR) 5 MG tablet Take 1 tablet (5 mg total) by mouth daily. 01/19/24   Lorre Munroe, NP  traZODone (DESYREL) 50 MG tablet Take 1 tablet (50 mg total) by mouth at bedtime. 12/24/23   Lorre Munroe, NP      Allergies    Doxycycline and Penicillins    Review of Systems   Review of Systems  Physical Exam Updated Vital Signs BP (!) 141/78   Pulse 70   Temp 98.1 F (36.7 C) (Oral)   Resp 11   Ht 5\' 7"  (1.702 m)   Wt 86.2 kg   SpO2 96%   BMI 29.76 kg/m  Physical Exam Vitals and nursing note reviewed.  Constitutional:      General: She is not in acute distress.    Appearance: She is well-developed. She is not ill-appearing.  HENT:     Head: Normocephalic and atraumatic.     Nose: Nose normal.     Mouth/Throat:     Mouth: Mucous membranes are moist.  Eyes:  Extraocular Movements: Extraocular movements intact.     Conjunctiva/sclera: Conjunctivae normal.     Pupils: Pupils are equal, round, and reactive to light.  Cardiovascular:     Rate and Rhythm: Normal rate and regular rhythm.     Pulses: Normal pulses.     Heart sounds: Normal heart sounds. No murmur heard. Pulmonary:     Effort: Pulmonary effort is normal. No respiratory distress.     Breath sounds: Normal breath sounds.  Abdominal:     Palpations: Abdomen is soft.     Tenderness: There is no abdominal tenderness.  Musculoskeletal:        General: No swelling.     Cervical back: Normal range of motion and neck supple.  Skin:    General: Skin is warm and dry.     Capillary Refill: Capillary refill takes less than 2 seconds.  Neurological:     General: No focal deficit present.     Mental Status: She is alert and oriented to person,  place, and time.     Cranial Nerves: No cranial nerve deficit.     Sensory: No sensory deficit.     Motor: No weakness.     Coordination: Coordination normal.     Comments: 5+ out of 5 strength, normal sensation, no drift, normal finger-nose-finger, normal speech, she does have a baseline tremor in her hands   Psychiatric:        Mood and Affect: Mood normal.     ED Results / Procedures / Treatments   Labs (all labs ordered are listed, but only abnormal results are displayed) Labs Reviewed  BASIC METABOLIC PANEL WITH GFR - Abnormal; Notable for the following components:      Result Value   Potassium 3.0 (*)    All other components within normal limits  CBC - Abnormal; Notable for the following components:   RBC 5.14 (*)    All other components within normal limits  CBG MONITORING, ED - Abnormal; Notable for the following components:   Glucose-Capillary 104 (*)    All other components within normal limits  HEPATIC FUNCTION PANEL    EKG EKG Interpretation Date/Time:  Tuesday March 23 2024 13:11:20 EDT Ventricular Rate:  66 PR Interval:  168 QRS Duration:  98 QT Interval:  447 QTC Calculation: 469 R Axis:   -50  Text Interpretation: Sinus rhythm Probable left atrial enlargement Confirmed by Virgina Norfolk [phone]) on 03/23/2024 1:30:03 PM  Radiology DG Chest Portable 1 View Result Date: 03/23/2024 CLINICAL DATA:  Weakness.  Palpitations. EXAM: PORTABLE CHEST 1 VIEW COMPARISON:  01/27/2024. FINDINGS: Bilateral lung fields are clear. Bilateral costophrenic angles are clear. Normal cardio-mediastinal silhouette. No acute osseous abnormalities. The soft tissues are within normal limits. IMPRESSION: No active disease. Electronically Signed   By: Jules Schick M.D.   On: 03/23/2024 15:03   CT Head Wo Contrast Result Date: 03/23/2024 CLINICAL DATA:  Headache, increasing frequency or severity. EXAM: CT HEAD WITHOUT CONTRAST TECHNIQUE: Contiguous axial images were obtained from the base  of the skull through the vertex without intravenous contrast. RADIATION DOSE REDUCTION: This exam was performed according to the departmental dose-optimization program which includes automated exposure control, adjustment of the mA and/or kV according to patient size and/or use of iterative reconstruction technique. COMPARISON:  None Available. FINDINGS: Brain: No evidence of acute infarction, hemorrhage, hydrocephalus, extra-axial collection or mass lesion/mass effect. There is bilateral periventricular hypodensity, which is non-specific but most likely seen in the settings of microvascular ischemic changes. Minimal  in extent. Otherwise normal appearance of brain parenchyma. Ventricles are normal. Cerebral volume is age appropriate. Vascular: No hyperdense vessel or unexpected calcification. Skull: Normal. Negative for fracture or focal lesion. Sinuses/Orbits: No acute finding. Small mucous retention cyst/polyp noted in the right chamber of sphenoid sinus. Other: Visualized mastoid air cells are unremarkable. No mastoid effusion. IMPRESSION: *No acute intracranial abnormality. Electronically Signed   By: Jules Schick M.D.   On: 03/23/2024 15:02    Procedures Procedures    Medications Ordered in ED Medications  LORazepam (ATIVAN) injection 0.5 mg (0.5 mg Intravenous Given 03/23/24 1329)  potassium chloride SA (KLOR-CON M) CR tablet 40 mEq (40 mEq Oral Given 03/23/24 1451)    ED Course/ Medical Decision Making/ A&P                                 Medical Decision Making Amount and/or Complexity of Data Reviewed Labs: ordered. Radiology: ordered.  Risk Prescription drug management.   Regina Huber is here with tremors in her hands.  Unremarkable vitals.  No fever.  Differential diagnosis likely stress related process I do not think that this is a Parkinson's or neuromuscular type process.  She is neurologically intact.  She is on trazodone and citalopram Ativan as needed for anxiety and  depression.  She recently decreased her citalopram dose.  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  She not endorsing any chest pain shortness of breath weakness numbness tingling otherwise.  She has been having issues with sleep.  Ultimately will look to see if there is any sort of electrolyte abnormality or abnormality with basic labs.  Will check CBC CMP get a head CT chest x-ray.  EKG shows sinus rhythm.  No ischemic changes.  Will get dose of Ativan to see if that helps with improvement.  I have no concern for stroke or seizures.  No concern for serotonin syndrome.  This does not appear to be consistent with some sort of toxidrome.  She has no clonus on exam.  Overall potassium is 3.0 but otherwise lab work for my review interpretation is unremarkable.  She is given potassium repletion orally here in the ED.  Will give the same for home.  Head CT chest x-ray unremarkable per radiology report.  Overall tremors greatly improved following Ativan.  I do think that this likely is stress related process but think it would be beneficial if she followed up with both neurology and her primary care doctor.  I did not get the sense that this was a Parkinson's type tremor but think she benefit from a outpatient workup further.  I will prescribe Ativan to take as needed.  Prescribed potassium.  Discharged in good condition.  Understands return precautions.  This chart was dictated using voice recognition software.  Despite best efforts to proofread,  errors can occur which can change the documentation meaning.         Final Clinical Impression(s) / ED Diagnoses Final diagnoses:  Occasional tremors    Rx / DC Orders ED Discharge Orders          Ordered    LORazepam (ATIVAN) 1 MG tablet  2 times daily PRN        03/23/24 1515    Ambulatory referral to Neurology       Comments: An appointment is requested in approximately: 1 week   03/23/24 1515  potassium  chloride SA (KLOR-CON M) 20 MEQ tablet  2 times daily        03/23/24 1523              Virgina Norfolk, DO 03/23/24 1524

## 2024-03-23 NOTE — Telephone Encounter (Signed)
Will review ER note 

## 2024-04-14 ENCOUNTER — Other Ambulatory Visit: Payer: Self-pay

## 2024-04-14 ENCOUNTER — Other Ambulatory Visit (HOSPITAL_COMMUNITY): Payer: Self-pay

## 2024-04-26 ENCOUNTER — Other Ambulatory Visit (HOSPITAL_COMMUNITY): Payer: Self-pay

## 2024-05-18 ENCOUNTER — Encounter: Payer: Self-pay | Admitting: Internal Medicine

## 2024-05-19 ENCOUNTER — Other Ambulatory Visit: Payer: Self-pay

## 2024-05-19 ENCOUNTER — Other Ambulatory Visit (HOSPITAL_COMMUNITY): Payer: Self-pay

## 2024-05-19 MED ORDER — LORAZEPAM 1 MG PO TABS
0.5000 mg | ORAL_TABLET | Freq: Two times a day (BID) | ORAL | 0 refills | Status: DC | PRN
  Filled 2024-05-19: qty 15, 15d supply, fill #0

## 2024-06-16 ENCOUNTER — Other Ambulatory Visit: Payer: Self-pay | Admitting: Internal Medicine

## 2024-06-16 DIAGNOSIS — I10 Essential (primary) hypertension: Secondary | ICD-10-CM

## 2024-06-17 ENCOUNTER — Other Ambulatory Visit: Payer: Self-pay

## 2024-06-17 ENCOUNTER — Other Ambulatory Visit (HOSPITAL_COMMUNITY): Payer: Self-pay

## 2024-06-17 MED ORDER — HYDROCHLOROTHIAZIDE 25 MG PO TABS
25.0000 mg | ORAL_TABLET | Freq: Every day | ORAL | 0 refills | Status: DC
Start: 2024-06-17 — End: 2024-07-29
  Filled 2024-06-17: qty 30, 30d supply, fill #0

## 2024-06-17 NOTE — Telephone Encounter (Signed)
 Requested medications are due for refill today.  yes  Requested medications are on the active medications list.  yes  Last refill. 05/19/2024 #15 0 rf  Future visit scheduled.   no  Notes to clinic.  Refill not delegated.    Requested Prescriptions  Pending Prescriptions Disp Refills   LORazepam  (ATIVAN ) 1 MG tablet 15 tablet 0    Sig: Take 0.5 tablets (0.5 mg total) by mouth 2 (two) times daily as needed for anxiety.     Not Delegated - Psychiatry: Anxiolytics/Hypnotics 2 Failed - 06/17/2024  4:46 PM      Failed - This refill cannot be delegated      Failed - Urine Drug Screen completed in last 360 days      Failed - Valid encounter within last 6 months    Recent Outpatient Visits   None            Passed - Patient is not pregnant

## 2024-06-17 NOTE — Telephone Encounter (Signed)
 Requested Prescriptions  Pending Prescriptions Disp Refills   hydrochlorothiazide  (HYDRODIURIL ) 25 MG tablet 30 tablet 0    Sig: Take 1 tablet (25 mg total) by mouth daily.     Cardiovascular: Diuretics - Thiazide Failed - 06/17/2024  4:49 PM      Failed - K in normal range and within 180 days    Potassium  Date Value Ref Range Status  03/23/2024 3.0 (L) 3.5 - 5.1 mmol/L Final         Failed - Last BP in normal range    BP Readings from Last 1 Encounters:  03/23/24 (!) 141/78         Failed - Valid encounter within last 6 months    Recent Outpatient Visits   None            Passed - Cr in normal range and within 180 days    Creat  Date Value Ref Range Status  12/24/2023 0.82 0.50 - 1.05 mg/dL Final   Creatinine, Ser  Date Value Ref Range Status  03/23/2024 0.80 0.44 - 1.00 mg/dL Final         Passed - Na in normal range and within 180 days    Sodium  Date Value Ref Range Status  03/23/2024 138 135 - 145 mmol/L Final          Courtesy refill. Patient will need an office visit for additional refills.

## 2024-06-19 ENCOUNTER — Other Ambulatory Visit (HOSPITAL_COMMUNITY): Payer: Self-pay

## 2024-06-19 MED ORDER — LORAZEPAM 1 MG PO TABS
0.5000 mg | ORAL_TABLET | Freq: Two times a day (BID) | ORAL | 0 refills | Status: DC | PRN
  Filled 2024-06-19: qty 15, 15d supply, fill #0

## 2024-06-21 ENCOUNTER — Other Ambulatory Visit: Payer: Self-pay

## 2024-06-21 ENCOUNTER — Other Ambulatory Visit (HOSPITAL_COMMUNITY): Payer: Self-pay

## 2024-06-22 ENCOUNTER — Other Ambulatory Visit: Payer: Self-pay

## 2024-07-06 ENCOUNTER — Other Ambulatory Visit: Payer: Self-pay | Admitting: Internal Medicine

## 2024-07-08 ENCOUNTER — Other Ambulatory Visit: Payer: Self-pay

## 2024-07-08 MED ORDER — TRAZODONE HCL 50 MG PO TABS
50.0000 mg | ORAL_TABLET | Freq: Every evening | ORAL | 0 refills | Status: AC
  Filled 2024-07-08: qty 30, 30d supply, fill #0

## 2024-07-08 NOTE — Telephone Encounter (Signed)
 Needs appointment - courtesy RF provided. Message has already been sent to patient on another refill Requested Prescriptions  Pending Prescriptions Disp Refills   traZODone  (DESYREL ) 50 MG tablet 90 tablet 0    Sig: Take 1 tablet (50 mg total) by mouth at bedtime.     Psychiatry: Antidepressants - Serotonin Modulator Failed - 07/08/2024  2:00 PM      Failed - Valid encounter within last 6 months    Recent Outpatient Visits   None            Passed - Completed PHQ-2 or PHQ-9 in the last 360 days

## 2024-07-10 ENCOUNTER — Other Ambulatory Visit: Payer: Self-pay | Admitting: Internal Medicine

## 2024-07-12 ENCOUNTER — Other Ambulatory Visit: Payer: Self-pay

## 2024-07-12 ENCOUNTER — Other Ambulatory Visit (HOSPITAL_COMMUNITY): Payer: Self-pay

## 2024-07-12 MED ORDER — ROSUVASTATIN CALCIUM 5 MG PO TABS
5.0000 mg | ORAL_TABLET | Freq: Every day | ORAL | 0 refills | Status: AC
  Filled 2024-07-12: qty 90, 90d supply, fill #0

## 2024-07-12 NOTE — Telephone Encounter (Signed)
 Requested Prescriptions  Pending Prescriptions Disp Refills   rosuvastatin  (CRESTOR ) 5 MG tablet 90 tablet 0    Sig: Take 1 tablet (5 mg total) by mouth daily.     Cardiovascular:  Antilipid - Statins 2 Failed - 07/12/2024  3:50 PM      Failed - Valid encounter within last 12 months    Recent Outpatient Visits   None            Failed - Lipid Panel in normal range within the last 12 months    Cholesterol  Date Value Ref Range Status  12/24/2023 242 (H) <200 mg/dL Final   LDL Cholesterol (Calc)  Date Value Ref Range Status  12/24/2023 148 (H) mg/dL (calc) Final    Comment:    Reference range: <100 . Desirable range <100 mg/dL for primary prevention;   <70 mg/dL for patients with CHD or diabetic patients  with > or = 2 CHD risk factors. [facility] LDL-C is now calculated using the Martin-Hopkins  calculation, which is a validated novel method providing  better accuracy than the Friedewald equation in the  estimation of LDL-C.  Gladis APPLETHWAITE et al. SANDREA. 7986;689(80): 2061-2068  (http://education.[facility].com/faq/FAQ164)    Direct LDL  Date Value Ref Range Status  03/25/2016 116.0 mg/dL Final    Comment:    Optimal:  <100 mg/dLNear or Above Optimal:  100-129 mg/dLBorderline High:  130-159 mg/dLHigh:  160-189 mg/dLVery High:  >190 mg/dL   HDL  Date Value Ref Range Status  12/24/2023 59 > OR = 50 mg/dL Final   Triglycerides  Date Value Ref Range Status  12/24/2023 210 (H) <150 mg/dL Final    Comment:    . If a non-fasting specimen was collected, consider repeat triglyceride testing on a fasting specimen if clinically indicated.  Veatrice et al. J. of Clin. Lipidol. 2015;9:129-169. [facility]AB[facility]          Passed - Cr in normal range and within 360 days    Creat  Date Value Ref Range Status  12/24/2023 0.82 0.50 - 1.05 mg/dL Final   Creatinine, Ser  Date Value Ref Range Status  03/23/2024 0.80 0.44 - 1.00 mg/dL Final         Passed - Patient is not pregnant        LORazepam  (ATIVAN ) 1 MG tablet 15 tablet 0    Sig: Take 0.5 tablets (0.5 mg total) by mouth 2 (two) times daily as needed for anxiety.     Not Delegated - Psychiatry: Anxiolytics/Hypnotics 2 Failed - 07/12/2024  3:50 PM      Failed - This refill cannot be delegated      Failed - Urine Drug Screen completed in last 360 days      Failed - Valid encounter within last 6 months    Recent Outpatient Visits   None            Passed - Patient is not pregnant      Refused Prescriptions Disp Refills   traZODone  (DESYREL ) 50 MG tablet 30 tablet 0    Sig: Take 1 tablet (50 mg total) by mouth at bedtime.     Psychiatry: Antidepressants - Serotonin Modulator Failed - 07/12/2024  3:50 PM      Failed - Valid encounter within last 6 months    Recent Outpatient Visits   None            Passed - Completed PHQ-2 or PHQ-9 in the last 360 days

## 2024-07-12 NOTE — Telephone Encounter (Signed)
 Requested medications are due for refill today.  yes  Requested medications are on the active medications list.  yes  Last refill. 06/19/2024 #15 0 rf  Future visit scheduled.   no  Notes to clinic.  Refill/refusal not delegated.    Requested Prescriptions  Pending Prescriptions Disp Refills   LORazepam  (ATIVAN ) 1 MG tablet 15 tablet 0    Sig: Take 0.5 tablets (0.5 mg total) by mouth 2 (two) times daily as needed for anxiety.     Not Delegated - Psychiatry: Anxiolytics/Hypnotics 2 Failed - 07/12/2024  3:50 PM      Failed - This refill cannot be delegated      Failed - Urine Drug Screen completed in last 360 days      Failed - Valid encounter within last 6 months    Recent Outpatient Visits   None            Passed - Patient is not pregnant      Signed Prescriptions Disp Refills   rosuvastatin  (CRESTOR ) 5 MG tablet 90 tablet 0    Sig: Take 1 tablet (5 mg total) by mouth daily.     Cardiovascular:  Antilipid - Statins 2 Failed - 07/12/2024  3:50 PM      Failed - Valid encounter within last 12 months    Recent Outpatient Visits   None            Failed - Lipid Panel in normal range within the last 12 months    Cholesterol  Date Value Ref Range Status  12/24/2023 242 (H) <200 mg/dL Final   LDL Cholesterol (Calc)  Date Value Ref Range Status  12/24/2023 148 (H) mg/dL (calc) Final    Comment:    Reference range: <100 . Desirable range <100 mg/dL for primary prevention;   <70 mg/dL for patients with CHD or diabetic patients  with > or = 2 CHD risk factors. [facility] LDL-C is now calculated using the Martin-Hopkins  calculation, which is a validated novel method providing  better accuracy than the Friedewald equation in the  estimation of LDL-C.  Gladis APPLETHWAITE et al. SANDREA. 7986;689(80): 2061-2068  (http://education.[facility].com/faq/FAQ164)    Direct LDL  Date Value Ref Range Status  03/25/2016 116.0 mg/dL Final    Comment:    Optimal:  <100 mg/dLNear or  Above Optimal:  100-129 mg/dLBorderline High:  130-159 mg/dLHigh:  160-189 mg/dLVery High:  >190 mg/dL   HDL  Date Value Ref Range Status  12/24/2023 59 > OR = 50 mg/dL Final   Triglycerides  Date Value Ref Range Status  12/24/2023 210 (H) <150 mg/dL Final    Comment:    . If a non-fasting specimen was collected, consider repeat triglyceride testing on a fasting specimen if clinically indicated.  Veatrice et al. J. of Clin. Lipidol. 2015;9:129-169. [facility]          Passed - Cr in normal range and within 360 days    Creat  Date Value Ref Range Status  12/24/2023 0.82 0.50 - 1.05 mg/dL Final   Creatinine, Ser  Date Value Ref Range Status  03/23/2024 0.80 0.44 - 1.00 mg/dL Final         Passed - Patient is not pregnant      Refused Prescriptions Disp Refills   traZODone  (DESYREL ) 50 MG tablet 30 tablet 0    Sig: Take 1 tablet (50 mg total) by mouth at bedtime.     Psychiatry: Antidepressants - Serotonin Modulator Failed - 07/12/2024  3:50  PM      Failed - Valid encounter within last 6 months    Recent Outpatient Visits   None            Passed - Completed PHQ-2 or PHQ-9 in the last 360 days

## 2024-07-19 ENCOUNTER — Other Ambulatory Visit (HOSPITAL_COMMUNITY): Payer: Self-pay

## 2024-07-22 ENCOUNTER — Ambulatory Visit (INDEPENDENT_AMBULATORY_CARE_PROVIDER_SITE_OTHER): Admitting: Internal Medicine

## 2024-07-22 ENCOUNTER — Encounter: Payer: Self-pay | Admitting: Internal Medicine

## 2024-07-22 VITALS — BP 134/78 | Ht 67.0 in | Wt 197.2 lb

## 2024-07-22 DIAGNOSIS — Z6830 Body mass index (BMI) 30.0-30.9, adult: Secondary | ICD-10-CM | POA: Diagnosis not present

## 2024-07-22 DIAGNOSIS — R7303 Prediabetes: Secondary | ICD-10-CM

## 2024-07-22 DIAGNOSIS — E66811 Obesity, class 1: Secondary | ICD-10-CM

## 2024-07-22 DIAGNOSIS — R21 Rash and other nonspecific skin eruption: Secondary | ICD-10-CM | POA: Diagnosis not present

## 2024-07-22 DIAGNOSIS — M10079 Idiopathic gout, unspecified ankle and foot: Secondary | ICD-10-CM

## 2024-07-22 DIAGNOSIS — E7800 Pure hypercholesterolemia, unspecified: Secondary | ICD-10-CM | POA: Diagnosis not present

## 2024-07-22 DIAGNOSIS — Z0001 Encounter for general adult medical examination with abnormal findings: Secondary | ICD-10-CM

## 2024-07-22 DIAGNOSIS — E6609 Other obesity due to excess calories: Secondary | ICD-10-CM

## 2024-07-22 NOTE — Patient Instructions (Signed)
 Health Maintenance for Postmenopausal Women Menopause is a normal process in which your ability to get pregnant comes to an end. This process happens slowly over many months or years, usually between the ages of 24 and 62. Menopause is complete when you have missed your menstrual period for 12 months. It is important to talk with your health care provider about some of the most common conditions that affect women after menopause (postmenopausal women). These include heart disease, cancer, and bone loss (osteoporosis). Adopting a healthy lifestyle and getting preventive care can help to promote your health and wellness. The actions you take can also lower your chances of developing some of these common conditions. What are the signs and symptoms of menopause? During menopause, you may have the following symptoms: Hot flashes. These can be moderate or severe. Night sweats. Decrease in sex drive. Mood swings. Headaches. Tiredness (fatigue). Irritability. Memory problems. Problems falling asleep or staying asleep. Talk with your health care provider about treatment options for your symptoms. Do I need hormone replacement therapy? Hormone replacement therapy is effective in treating symptoms that are caused by menopause, such as hot flashes and night sweats. Hormone replacement carries certain risks, especially as you become older. If you are thinking about using estrogen or estrogen with progestin, discuss the benefits and risks with your health care provider. How can I reduce my risk for heart disease and stroke? The risk of heart disease, heart attack, and stroke increases as you age. One of the causes may be a change in the body's hormones during menopause. This can affect how your body uses dietary fats, triglycerides, and cholesterol. Heart attack and stroke are medical emergencies. There are many things that you can do to help prevent heart disease and stroke. Watch your blood pressure High  blood pressure causes heart disease and increases the risk of stroke. This is more likely to develop in people who have high blood pressure readings or are overweight. Have your blood pressure checked: Every 3-5 years if you are 50-75 years of age. Every year if you are 77 years old or older. Eat a healthy diet  Eat a diet that includes plenty of vegetables, fruits, low-fat dairy products, and lean protein. Do not eat a lot of foods that are high in solid fats, added sugars, or sodium. Get regular exercise Get regular exercise. This is one of the most important things you can do for your health. Most adults should: Try to exercise for at least 150 minutes each week. The exercise should increase your heart rate and make you sweat (moderate-intensity exercise). Try to do strengthening exercises at least twice each week. Do these in addition to the moderate-intensity exercise. Spend less time sitting. Even light physical activity can be beneficial. Other tips Work with your health care provider to achieve or maintain a healthy weight. Do not use any products that contain nicotine or tobacco. These products include cigarettes, chewing tobacco, and vaping devices, such as e-cigarettes. If you need help quitting, ask your health care provider. Know your numbers. Ask your health care provider to check your cholesterol and your blood sugar (glucose). Continue to have your blood tested as directed by your health care provider. Do I need screening for cancer? Depending on your health history and family history, you may need to have cancer screenings at different stages of your life. This may include screening for: Breast cancer. Cervical cancer. Lung cancer. Colorectal cancer. What is my risk for osteoporosis? After menopause, you may be  at increased risk for osteoporosis. Osteoporosis is a condition in which bone destruction happens more quickly than new bone creation. To help prevent osteoporosis or  the bone fractures that can happen because of osteoporosis, you may take the following actions: If you are 61-3 years old, get at least 1,000 mg of calcium and at least 600 international units (IU) of vitamin D per day. If you are older than age 61 but younger than age 75, get at least 1,200 mg of calcium and at least 600 international units (IU) of vitamin D per day. If you are older than age 62, get at least 1,200 mg of calcium and at least 800 international units (IU) of vitamin D per day. Smoking and drinking excessive alcohol increase the risk of osteoporosis. Eat foods that are rich in calcium and vitamin D, and do weight-bearing exercises several times each week as directed by your health care provider. How does menopause affect my mental health? Depression may occur at any age, but it is more common as you become older. Common symptoms of depression include: Feeling depressed. Changes in sleep patterns. Changes in appetite or eating patterns. Feeling an overall lack of motivation or enjoyment of activities that you previously enjoyed. Frequent crying spells. Talk with your health care provider if you think that you are experiencing any of these symptoms. General instructions See your health care provider for regular wellness exams and vaccines. This may include: Scheduling regular health, dental, and eye exams. Getting and maintaining your vaccines. These include: Influenza vaccine. Get this vaccine each year before the flu season begins. Pneumonia vaccine. Shingles vaccine. Tetanus, diphtheria, and pertussis (Tdap) booster vaccine. Your health care provider may also recommend other immunizations. Tell your health care provider if you have ever been abused or do not feel safe at home. Summary Menopause is a normal process in which your ability to get pregnant comes to an end. This condition causes hot flashes, night sweats, decreased interest in sex, mood swings, headaches, or lack  of sleep. Treatment for this condition may include hormone replacement therapy. Take actions to keep yourself healthy, including exercising regularly, eating a healthy diet, watching your weight, and checking your blood pressure and blood sugar levels. Get screened for cancer and depression. Make sure that you are up to date with all your vaccines. This information is not intended to replace advice given to you by your health care provider. Make sure you discuss any questions you have with your health care provider. Document Revised: 04/23/2021 Document Reviewed: 04/23/2021 Elsevier Patient Education  2024 [employer].

## 2024-07-22 NOTE — Assessment & Plan Note (Signed)
 Encourage diet and exercise for weight loss

## 2024-07-22 NOTE — Progress Notes (Signed)
 Subjective:    Patient ID: Regina Huber, female    DOB: Aug 12, 1961, 63 y.o.   MRN: [id number]  HPI  Patient presents to clinic today for her annual exam.  Flu: 09/2023 Tetanus: 03/2016 COVID: Pfizer x2 Shingrix: 05/2021, 07/2021 Pap smear: Hysterectomy Mammogram: 04/2024, [facility] Bone density: Never Colon screening: 10/2019 Vision screening: annually Dentist: biannually  Diet: She does not eat meat. She consumes fruits and veggies. She tries to avoid fried foods. 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 pt states she received 9 pints of blood   Vaginal intraepithelial neoplasia III (VAIN III)     Current Outpatient Medications  Medication Sig Dispense Refill   allopurinol  (ZYLOPRIM ) 100 MG tablet Take 1 tablet (100 mg total) by mouth daily. 90 tablet 1   amLODipine  (NORVASC ) 5 MG tablet Take 1 tablet (5 mg total) by mouth daily. 90 tablet 1   citalopram  (CELEXA ) 40 MG tablet Take 1 tablet (40 mg total) by mouth daily. 90 tablet 1   colchicine  0.6 MG tablet TAKE 1 TABLET BY MOUTH 2 TIMES DAILY FOR 7 - 10 DAYS THEN TAKE AS DIRECTED AS NEEDED FOR ATTACKS. 90 tablet 1   hydrochlorothiazide  (HYDRODIURIL ) 25 MG tablet Take 1 tablet (25 mg total) by mouth daily. 30 tablet 0   ibuprofen  (ADVIL ) 600 MG tablet Take 1 tablet (600 mg total) by mouth every 8 (eight) hours as needed. 30 tablet 0   LORazepam  (ATIVAN ) 1 MG tablet Take 0.5 tablets (0.5 mg total) by mouth 2 (two) times daily  as needed for anxiety. 15 tablet 0   Multiple Vitamin (MULTIVITAMIN) tablet Take 1 tablet by mouth daily.     potassium chloride  SA (KLOR-CON  M) 20 MEQ tablet Take 1 tablet (20 mEq total) by mouth 2 (two) times daily. 6 tablet 0   rosuvastatin  (CRESTOR ) 5 MG tablet Take 1 tablet (5 mg total) by mouth daily. 90 tablet 0   traZODone  (DESYREL ) 50 MG tablet Take 1 tablet (50 mg total) by mouth at bedtime. 30 tablet 0   No current facility-administered medications for this visit.    Allergies  Allergen Reactions   Doxycycline  Other (See Comments)    Oral blisters   Penicillins Rash    - has tolerated cephalosporins    Family History  Problem Relation Age of Onset   Hypertension Mother    Diabetes Mother    Breast cancer Mother    Alcohol abuse Father    Hypertension Father    Stroke Father    Colon cancer Father 87   Heart disease Brother 54       MI   Colon polyps Brother    Colon polyps Sister    Colon polyps Sister    Colon polyps Brother    Esophageal cancer Neg Hx  Rectal cancer Neg Hx    Stomach cancer Neg Hx     Social History   Socioeconomic History   Marital status: Single    Spouse name: Not on file   Number of children: Not on file   Years of education: Not on file   Highest education level: Bachelor's degree (e.g., BA, AB, BS)  Occupational History   Not on file  Tobacco Use   Smoking status: Never   Smokeless tobacco: Never  Vaping Use   Vaping status: Never Used  Substance and Sexual Activity   Alcohol use: Yes    Comment: rarely   Drug use: No   Sexual activity: Not on file  Other Topics Concern   Not on file  Social History Narrative   Married, twin daughters born 51, son born 4   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   Social Drivers of Corporate investment banker Strain: Low Risk  (12/22/2023)   Overall Financial Resource Strain (CARDIA)    Difficulty of Paying Living Expenses: Not hard at all  Food Insecurity: No Food Insecurity (12/22/2023)    Hunger Vital Sign    Worried About Running Out of Food in the Last Year: Never true    Ran Out of Food in the Last Year: Never true  Transportation Needs: No Transportation Needs (12/22/2023)   PRAPARE - Administrator, Civil Service (Medical): No    Lack of Transportation (Non-Medical): No  Physical Activity: Insufficiently Active (12/22/2023)   Exercise Vital Sign    Days of Exercise per Week: 5 days    Minutes of Exercise per Session: 20 min  Stress: No Stress Concern Present (12/22/2023)   [employer] of Occupational Health - Occupational Stress Questionnaire    Feeling of Stress : Only a little  Social Connections: Moderately Integrated (12/22/2023)   Social Connection and Isolation Panel    Frequency of Communication with Friends and Family: More than three times a week    Frequency of Social Gatherings with Friends and Family: Twice a week    Attends Religious Services: More than 4 times per year    Active Member of [employer] or Organizations: Yes    Attends Banker Meetings: 1 to 4 times per year    Marital Status: Divorced  Catering manager Violence: Not on file     Constitutional: Denies fever, malaise, fatigue, headache or abrupt weight changes.  HEENT: Denies eye pain, eye redness, ear pain, ringing in the ears, wax buildup, runny nose, nasal congestion, bloody nose, or sore throat. Respiratory: Denies difficulty breathing, shortness of breath, cough or sputum production.   Cardiovascular: Denies chest pain, chest tightness, palpitations or swelling in the hands or feet.  Gastrointestinal: Denies abdominal pain, bloating, constipation, diarrhea or blood in the stool.  GU: Denies urgency, frequency, pain with urination, burning sensation, blood in urine, odor or discharge. Musculoskeletal: Patient reports intermittent joint pain, muscle aches.  Denies decrease in range of motion, difficulty with gait, or joint swelling.  Skin: Patient reports rash of  back.  Denies ulcerations.  Neurological: Patient reports insomnia.  Denies dizziness, difficulty with memory, difficulty with speech or problems with balance and coordination.  Psych: Patient has a history of anxiety and depression.  Denies SI/HI.  No other specific complaints in a complete review of systems (except as listed in HPI above).  Objective:   Physical Exam  BP 134/78 (BP Location: Left Arm, Patient Position: Sitting, Cuff Size: Normal)   Ht  5' 7 (1.702 m)   Wt 197 lb 3.2 oz (89.4 kg)   BMI 30.89 kg/m    Wt Readings from Last 3 Encounters:  03/23/24 190 lb (86.2 kg)  01/27/24 190 lb (86.2 kg)  01/16/24 200 lb (90.7 kg)    General: Appears her stated age, obese, in NAD. Skin: Warm, dry and intact.  Grouped vesicular/scabbed lesions noted central portion of back in between shoulder blades. HEENT: Head: normal shape and size; Eyes: sclera white, no icterus, conjunctiva pink, PERRLA and EOMs intact;  Neck:  Neck supple, trachea midline. No masses, lumps or thyromegaly present.  Cardiovascular: Normal rate and rhythm. S1,S2 noted.  No murmur, rubs or gallops noted. No JVD or BLE edema. No carotid bruits noted. Pulmonary/Chest: Normal effort and positive vesicular breath sounds. No respiratory distress. No wheezes, rales or ronchi noted.  Abdomen: Soft and nontender. Normal bowel sounds.  Musculoskeletal: Strength 5/5 BUE/BLE.  No difficulty with gait.  Neurological: Alert and oriented. Cranial nerves II-XII grossly intact. Coordination normal.  Psychiatric: Mood and affect normal. Behavior is normal. Judgment and thought content normal.   BMET    Component Value Date/Time   NA 138 03/23/2024 1334   K 3.0 (L) 03/23/2024 1334   CL 99 03/23/2024 1334   CO2 27 03/23/2024 1334   GLUCOSE 95 03/23/2024 1334   BUN 12 03/23/2024 1334   CREATININE 0.80 03/23/2024 1334   CREATININE 0.82 12/24/2023 0859   CALCIUM  9.5 03/23/2024 1334   GFRNONAA >60 03/23/2024 1334   GFRNONAA  73 05/15/2021 0822   GFRAA 85 05/15/2021 0822    Lipid Panel     Component Value Date/Time   CHOL 242 (H) 12/24/2023 0859   TRIG 210 (H) 12/24/2023 0859   HDL 59 12/24/2023 0859   CHOLHDL 4.1 12/24/2023 0859   VLDL 38.4 09/20/2019 1004   LDLCALC 148 (H) 12/24/2023 0859    CBC    Component Value Date/Time   WBC 9.1 03/23/2024 1334   RBC 5.14 (H) 03/23/2024 1334   HGB 14.5 03/23/2024 1334   HCT 42.4 03/23/2024 1334   PLT 328 03/23/2024 1334   MCV 82.5 03/23/2024 1334   MCH 28.2 03/23/2024 1334   MCHC 34.2 03/23/2024 1334   RDW 13.4 03/23/2024 1334   LYMPHSABS 1.9 09/20/2019 1004   MONOABS 0.5 09/20/2019 1004   EOSABS 0.2 09/20/2019 1004   BASOSABS 0.0 09/20/2019 1004    Hgb A1C Lab Results  Component Value Date   HGBA1C 6.1 (H) 12/24/2023           Assessment & Plan:   Preventative Health Maintenance:  Encouraged her to get a flu shot in the fall Tetanus UTD Encouraged her to get her COVID booster Shingrix UTD She no longer needs Pap smears Mammogram UTD, will request copy We will have her schedule her bone density with her mammogram next year through her GYN Colon screening UTD Encouraged her to consume a balanced diet and exercise regimen Advised her to see an eye doctor and dentist annually We will check CBC, c-Met, lipid, A1c today  Rash of back:  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

## 2024-07-23 ENCOUNTER — Ambulatory Visit: Payer: Self-pay | Admitting: Internal Medicine

## 2024-07-28 LAB — LIPID PANEL
Cholesterol: 185 mg/dL (ref <200)
HDL: 53 mg/dL (ref >=50)
LDL Cholesterol (Calc): 95 mg/dL
Non-HDL Cholesterol (Calc): 132 mg/dL — ABNORMAL HIGH (ref <130)
Total CHOL/HDL Ratio: 3.5 (calc) (ref <5.0)
Triglycerides: 242 mg/dL — ABNORMAL HIGH (ref <150)

## 2024-07-28 LAB — CBC
HCT: 44.5 % (ref 35.0–45.0)
Hemoglobin: 14.6 g/dL (ref 11.7–15.5)
MCH: 28.1 pg (ref 27.0–33.0)
MCHC: 32.8 g/dL (ref 32.0–36.0)
MCV: 85.6 fL (ref 80.0–100.0)
MPV: 10.2 fL (ref 7.5–12.5)
Platelets: 298 Thousand/uL (ref 140–400)
RBC: 5.2 Million/uL — ABNORMAL HIGH (ref 3.80–5.10)
RDW: 13.1 % (ref 11.0–15.0)
WBC: 7.4 Thousand/uL (ref 3.8–10.8)

## 2024-07-28 LAB — COMPREHENSIVE METABOLIC PANEL WITH GFR
AG Ratio: 1.6 (calc) (ref 1.0–2.5)
ALT: 17 U/L (ref 6–29)
AST: 16 U/L (ref 10–35)
Albumin: 4.4 g/dL (ref 3.6–5.1)
Alkaline phosphatase (APISO): 88 U/L (ref 37–153)
BUN: 12 mg/dL (ref 7–25)
CO2: 30 mmol/L (ref 20–32)
Calcium: 10 mg/dL (ref 8.6–10.4)
Chloride: 100 mmol/L (ref 98–110)
Creat: 0.79 mg/dL (ref 0.50–1.05)
Globulin: 2.8 g/dL (ref 1.9–3.7)
Glucose, Bld: 96 mg/dL (ref 65–99)
Potassium: 3.7 mmol/L (ref 3.5–5.3)
Sodium: 141 mmol/L (ref 135–146)
Total Bilirubin: 0.6 mg/dL (ref 0.2–1.2)
Total Protein: 7.2 g/dL (ref 6.1–8.1)
eGFR: 85 mL/min/1.73m2 (ref >=60)

## 2024-07-28 LAB — HEMOGLOBIN A1C
Hgb A1c MFr Bld: 6 % — ABNORMAL HIGH (ref <5.7)
Mean Plasma Glucose: 126 mg/dL
eAG (mmol/L): 7 mmol/L

## 2024-07-28 LAB — VARICELLA ZOSTER ANTIBODY, IGM: Varicella Zoster Ab IgM: <=0.9 (ref <=0.90)

## 2024-07-28 LAB — URIC ACID: Uric Acid, Serum: 9.2 mg/dL — ABNORMAL HIGH (ref 2.5–7.0)

## 2024-07-29 ENCOUNTER — Other Ambulatory Visit: Payer: Self-pay | Admitting: Internal Medicine

## 2024-07-29 ENCOUNTER — Encounter (HOSPITAL_COMMUNITY): Payer: Self-pay

## 2024-07-29 ENCOUNTER — Other Ambulatory Visit (HOSPITAL_COMMUNITY): Payer: Self-pay

## 2024-07-29 DIAGNOSIS — I10 Essential (primary) hypertension: Secondary | ICD-10-CM

## 2024-08-02 ENCOUNTER — Other Ambulatory Visit: Payer: Self-pay

## 2024-08-02 ENCOUNTER — Other Ambulatory Visit (HOSPITAL_COMMUNITY): Payer: Self-pay

## 2024-08-02 MED ORDER — AMLODIPINE BESYLATE 5 MG PO TABS
5.0000 mg | ORAL_TABLET | Freq: Every day | ORAL | 1 refills | Status: AC
  Filled 2024-08-02: qty 90, 90d supply, fill #0

## 2024-08-02 MED ORDER — HYDROCHLOROTHIAZIDE 25 MG PO TABS
25.0000 mg | ORAL_TABLET | Freq: Every day | ORAL | 1 refills | Status: AC
  Filled 2024-08-02: qty 90, 90d supply, fill #0
  Filled 2024-12-11: qty 90, 90d supply, fill #1

## 2024-08-02 NOTE — Telephone Encounter (Signed)
 Requested Prescriptions  Pending Prescriptions Disp Refills   amLODipine  (NORVASC ) 5 MG tablet 90 tablet 1    Sig: Take 1 tablet (5 mg total) by mouth daily.     Cardiovascular: Calcium  Channel Blockers 2 Passed - 08/02/2024  1:43 PM      Passed - Last BP in normal range    BP Readings from Last 1 Encounters:  07/22/24 134/78         Passed - Last Heart Rate in normal range    Pulse Readings from Last 1 Encounters:  03/23/24 70         Passed - Valid encounter within last 6 months    Recent Outpatient Visits           1 week ago Encounter for general adult medical examination with abnormal findings   [facility] [place], Angeline ORN, NP

## 2024-08-02 NOTE — Telephone Encounter (Signed)
 Requested Prescriptions  Pending Prescriptions Disp Refills   hydrochlorothiazide  (HYDRODIURIL ) 25 MG tablet 90 tablet 1    Sig: Take 1 tablet (25 mg total) by mouth daily.     Cardiovascular: Diuretics - Thiazide Passed - 08/02/2024  2:21 PM      Passed - Cr in normal range and within 180 days    Creat  Date Value Ref Range Status  07/22/2024 0.79 0.50 - 1.05 mg/dL Final         Passed - K in normal range and within 180 days    Potassium  Date Value Ref Range Status  07/22/2024 3.7 3.5 - 5.3 mmol/L Final         Passed - Na in normal range and within 180 days    Sodium  Date Value Ref Range Status  07/22/2024 141 135 - 146 mmol/L Final         Passed - Last BP in normal range    BP Readings from Last 1 Encounters:  07/22/24 134/78         Passed - Valid encounter within last 6 months    Recent Outpatient Visits           1 week ago Encounter for general adult medical examination with abnormal findings   [facility] [place], Angeline ORN, [place]

## 2024-08-10 NOTE — Telephone Encounter (Signed)
 yes

## 2024-08-10 NOTE — Telephone Encounter (Signed)
Yes, she would need an appointment.  

## 2024-08-11 ENCOUNTER — Telehealth: Admitting: Internal Medicine

## 2024-08-11 DIAGNOSIS — M778 Other enthesopathies, not elsewhere classified: Secondary | ICD-10-CM

## 2024-08-11 MED ORDER — PREDNISONE 10 MG PO TABS: ORAL_TABLET | ORAL | 0 refills | Status: DC

## 2024-08-11 NOTE — Patient Instructions (Signed)
Adhesive Capsulitis  Adhesive capsulitis, also called frozen shoulder, causes the shoulder to become stiff and painful to move. This condition happens when there is inflammation of the tendons and ligaments that surround the shoulder joint (shoulder capsule). Tendons are tissues that connect muscle to bone. Ligaments are tissues that connect bones to each other. What are the causes? This condition may be caused by: An injury to your shoulder joint. Straining your shoulder. Not moving your shoulder for a period of time. This can happen if your arm was injured or in a sling. In some cases, the cause is not known. What increases the risk? You are more likely to develop this condition if: You are female. You are older than 63 years of age. You have certain other conditions, such as: Diabetes. Thyroid problems. Stroke. You recently had surgery, especially shoulder or neck surgery. What are the signs or symptoms? Symptoms of this condition include: Pain in your shoulder when you move your arm. There may also be pain when parts of your shoulder are touched. The pain may be worse at night or when you are resting. Not being able to move your shoulder normally. Sudden muscle tightening (muscle spasms). How is this diagnosed? This condition is diagnosed with a physical exam and imaging tests, such as an X-ray or MRI. How is this treated? This condition may be treated with: Treatment of the injury or condition that caused the adhesive capsulitis. Medicines for pain, inflammation, or muscle spasms. Injections of medicine (steroids) into the shoulder joint. Physical therapy. This involves doing exercises to get the shoulder moving again. Shoulder manipulation. This is a procedure to move the shoulder into another position. It is done after you are given a medicine to make you fall asleep (general anesthesia). The joint may also be injected with salt water at high pressure to break down  scarring. Surgery. This may be done in severe cases when other treatments do not work. Some less common treatments include: Injection of hyaluronic acid into the shoulder joint. This substance is a normal part of the fluid inside joints. It helps lubricate the joint and can lower inflammation. Injection of platelet-rich plasma into the shoulder joint. This uses a type of blood cell from your own blood that may speed up the healing process. Sending energy waves to the affected area (extracorporeal shock wave therapy). Most people recover completely from adhesive capsulitis, but some may not get back full shoulder movement. Follow these instructions at home: Managing pain, stiffness, and swelling     If told, put ice on the injured area. Put ice in a plastic bag. Place a towel between your skin and the bag. Leave the ice on for 20 minutes, 2-3 times a day. If told, apply heat to the affected area before you exercise. Use the heat source that your health care provider recommends, such as a moist heat pack or a heating pad. Place a towel between your skin and the heat source. Leave the heat on for 20-30 minutes. If your skin turns bright red, remove the ice or heat right away to prevent skin damage. The risk of damage is higher if you cannot feel pain, heat, or cold. General instructions Take over-the-counter and prescription medicines only as told by your provider. If you are being treated with physical therapy, do exercises as told. Avoid activities or exercises that put a lot of demand on your shoulder, such as throwing. These can make pain worse. Contact a health care provider if: You have  new symptoms. Your symptoms get worse. This information is not intended to replace advice given to you by your health care provider. Make sure you discuss any questions you have with your health care provider. Document Revised: 09/16/2022 Document Reviewed: 09/16/2022 Elsevier Patient Education  2024  [employer].

## 2024-08-11 NOTE — Progress Notes (Signed)
 Virtual Visit via Video Note  I connected with Regina Huber on 08/11/24 at 11:40 AM EDT by a video enabled telemedicine application and verified that I am speaking with the correct person using two identifiers.  Location: Patient: Home Provider: Office  Person's participating in this video call: Angeline Laura, NP-C and Pranathi Winfree   I discussed the limitations of evaluation and management by telemedicine and the availability of in person appointments. The patient expressed understanding and agreed to proceed.  History of Present Illness:   Discussed the use of AI scribe software for clinical note transcription with the patient, who gave verbal consent to proceed.  Regina Huber is a 63 year old female with capsulitis of the right second toe who presents with right second toe pain and swelling.  She experiences pain and swelling in her right second toe, specifically at the joint where the toe meets the foot. The symptoms have been present intermittently since January. She was previously diagnosed with capsulitis and treated with methylprednisolone  4 mg. She notes that wearing flip flops may be exacerbating the condition. Ibuprofen  provides some relief but does not completely alleviate the pain. The area is currently red and swollen.  No complete relief with ibuprofen .      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 pt states she received 9 pints of blood   Vaginal intraepithelial neoplasia III (VAIN III)     Current Outpatient Medications  Medication Sig  Dispense Refill   amLODipine  (NORVASC ) 5 MG tablet Take 1 tablet (5 mg total) by mouth daily. 90 tablet 1   citalopram  (CELEXA ) 40 MG tablet Take 1 tablet (40 mg total) by mouth daily. (Patient taking differently: Take 20 mg by mouth daily.) 90 tablet 1   colchicine  0.6 MG tablet TAKE 1 TABLET BY MOUTH 2 TIMES DAILY FOR 7 - 10 DAYS THEN TAKE AS DIRECTED AS NEEDED FOR ATTACKS. (Patient taking differently: as needed.) 90 tablet 1   hydrochlorothiazide  (HYDRODIURIL ) 25 MG tablet Take 1 tablet (25 mg total) by mouth daily. 90 tablet 1   ibuprofen  (ADVIL ) 600 MG tablet Take 1 tablet (600 mg total) by mouth every 8 (eight) hours as needed. 30 tablet 0   LORazepam  (ATIVAN ) 1 MG tablet Take 0.5 tablets (0.5 mg total) by mouth 2 (two) times daily as needed for anxiety. 15 tablet 0   rosuvastatin  (CRESTOR ) 5 MG tablet Take 1 tablet (5 mg total) by mouth daily. 90 tablet 0   traZODone  (DESYREL ) 50 MG tablet Take 1 tablet (50 mg total) by mouth at bedtime. (Patient taking differently: Take 25 mg by mouth at bedtime.) 30 tablet 0   No current facility-administered medications for this visit.    Allergies  Allergen Reactions   Doxycycline  Other (See Comments)    Oral blisters   Penicillins Rash    - has tolerated cephalosporins    Family History  Problem Relation Age of Onset   Hypertension Mother    Diabetes Mother    Breast cancer Mother    Alcohol abuse Father    Hypertension Father    Stroke Father    Colon cancer Father 3   Heart disease Brother 78       MI   Colon polyps Brother    Colon polyps Sister    Colon polyps Sister    Colon polyps Brother    Esophageal cancer Neg Hx    Rectal cancer Neg Hx    Stomach cancer Neg Hx     Social History   Socioeconomic History   Marital status: Single    Spouse name: Not on file   Number of children: Not on file   Years of education: Not on file   Highest education level: Bachelor's degree (e.g., BA, AB, BS)  Occupational History   Not  on file  Tobacco Use   Smoking status: Never   Smokeless tobacco: Never  Vaping Use   Vaping status: Never Used  Substance and Sexual Activity   Alcohol use: Yes    Comment: rarely   Drug use: No   Sexual activity: Not on file  Other Topics Concern   Not on file  Social History Narrative   Married, twin daughters born 61, son born 72   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   Social Drivers of Corporate investment banker Strain: Low Risk  (12/22/2023)   Overall Financial Resource Strain (CARDIA)    Difficulty of Paying Living Expenses: Not hard at all  Food Insecurity: No Food Insecurity (12/22/2023)   Hunger Vital Sign    Worried About Running Out of Food in the Last Year: Never true    Ran Out of Food in the Last Year: Never true  Transportation Needs: No Transportation Needs (12/22/2023)   PRAPARE - Administrator, Civil Service (Medical): No    Lack of Transportation (Non-Medical): No  Physical Activity: Insufficiently Active (12/22/2023)   Exercise Vital Sign    Days of Exercise per Week: 5 days    Minutes of Exercise per Session: 20 min  Stress: No Stress Concern Present (12/22/2023)   [employer] of Occupational Health - Occupational Stress Questionnaire    Feeling of Stress : Only a little  Social Connections: Moderately Integrated (12/22/2023)   Social Connection and Isolation Panel    Frequency of Communication with Friends and Family: More than three times a week    Frequency of Social Gatherings with Friends and Family: Twice a week    Attends Religious Services: More than 4 times per year    Active Member of [employer] or Organizations: Yes    Attends Banker Meetings: 1 to 4 times per year    Marital Status: Divorced  Catering manager Violence: Not on file     Constitutional: Denies fever, malaise, fatigue, headache or abrupt weight changes.  Respiratory: Denies difficulty breathing, shortness of breath, cough or sputum production.    Cardiovascular: Denies chest pain, chest tightness, palpitations or swelling in the hands or feet.  Musculoskeletal: Patient reports toe pain and swelling.  Denies decrease in range of motion, difficulty with gait, muscle pain.  Skin: Patient orts redness and warmth of second toe on right foot.  Denies  rashes, lesions or ulcercations.   No other specific complaints in a complete review of systems (except as listed in HPI above).  Observations/Objective:  Wt Readings from Last 3  Encounters:  07/22/24 197 lb 3.2 oz (89.4 kg)  03/23/24 190 lb (86.2 kg)  01/27/24 190 lb (86.2 kg)    General: Appears her  stated age, well developed, well nourished in NAD. Pulmonary/Chest: Normal effort. No respiratory distress.  Abdomen: Soft and nontender. Normal bowel sounds. No distention or masses noted. Liver, spleen and kidneys non palpable. Musculoskeletal: Unable to clearly visualize if there is swelling or redness at the base of the second toe on the right foot. Neurological: Alert and oriented.   BMET    Component Value Date/Time   NA 141 07/22/2024 0954   K 3.7 07/22/2024 0954   CL 100 07/22/2024 0954   CO2 30 07/22/2024 0954   GLUCOSE 96 07/22/2024 0954   BUN 12 07/22/2024 0954   CREATININE 0.79 07/22/2024 0954   CALCIUM  10.0 07/22/2024 0954   GFRNONAA >60 03/23/2024 1334   GFRNONAA 73 05/15/2021 0822   GFRAA 85 05/15/2021 0822    Lipid Panel     Component Value Date/Time   CHOL 185 07/22/2024 0954   TRIG 242 (H) 07/22/2024 0954   HDL 53 07/22/2024 0954   CHOLHDL 3.5 07/22/2024 0954   VLDL 38.4 09/20/2019 1004   LDLCALC 95 07/22/2024 0954    CBC    Component Value Date/Time   WBC 7.4 07/22/2024 0954   RBC 5.20 (H) 07/22/2024 0954   HGB 14.6 07/22/2024 0954   HCT 44.5 07/22/2024 0954   PLT 298 07/22/2024 0954   MCV 85.6 07/22/2024 0954   MCH 28.1 07/22/2024 0954   MCHC 32.8 07/22/2024 0954   RDW 13.1 07/22/2024 0954   LYMPHSABS 1.9 09/20/2019 1004   MONOABS 0.5  09/20/2019 1004   EOSABS 0.2 09/20/2019 1004   BASOSABS 0.0 09/20/2019 1004    Hgb A1C Lab Results  Component Value Date   HGBA1C 6.0 (H) 07/22/2024       Assessment and Plan:  Assessment and Plan    Right second toe capsulitis Chronic capsulitis with intermittent flare-ups, currently with redness and swelling, not gout.  - Prescribe prednisone  taper x 6 days - Advise follow-up with Dr. Alona if symptoms worsen or steroid injection needed.      RTC in 6 months for follow-up of chronic conditions  Follow Up Instructions:    I discussed the assessment and treatment plan with the patient. The patient was provided an opportunity to ask questions and all were answered. The patient agreed with the plan and demonstrated an understanding of the instructions.   The patient was advised to call back or seek an in-person evaluation if the symptoms worsen or if the condition fails to improve as anticipated.   Angeline Laura, NP

## 2024-09-19 ENCOUNTER — Other Ambulatory Visit: Payer: Self-pay | Admitting: Internal Medicine

## 2024-09-21 ENCOUNTER — Other Ambulatory Visit (HOSPITAL_COMMUNITY): Payer: Self-pay

## 2024-09-21 ENCOUNTER — Other Ambulatory Visit: Payer: Self-pay

## 2024-09-21 MED ORDER — LORAZEPAM 1 MG PO TABS
0.5000 mg | ORAL_TABLET | Freq: Two times a day (BID) | ORAL | 0 refills | Status: DC | PRN
  Filled 2024-09-21: qty 15, 15d supply, fill #0

## 2024-09-21 NOTE — Telephone Encounter (Signed)
 Requested medications are due for refill today.  yes  Requested medications are on the active medications list.  yes  Last refill. 06/19/2024 #15 0 rf  Future visit scheduled.   yes  Notes to clinic.  Refill not delegated    Requested Prescriptions  Pending Prescriptions Disp Refills   LORazepam  (ATIVAN ) 1 MG tablet 15 tablet 0    Sig: Take 0.5 tablets (0.5 mg total) by mouth 2 (two) times daily as needed for anxiety.     Not Delegated - Psychiatry: Anxiolytics/Hypnotics 2 Failed - 09/21/2024 11:51 AM      Failed - This refill cannot be delegated      Failed - Urine Drug Screen completed in last 360 days      Passed - Patient is not pregnant      Passed - Valid encounter within last 6 months    Recent Outpatient Visits           1 month ago Capsulitis of foot, right   [place] [facility] [place], Angeline ORN, NP   2 months ago Encounter for general adult medical examination with abnormal findings   [place] [facility] [place], Angeline ORN, NP

## 2024-10-05 ENCOUNTER — Other Ambulatory Visit: Payer: Self-pay

## 2024-10-05 MED ORDER — FLUZONE 0.5 ML IM SUSY
0.5000 mL | PREFILLED_SYRINGE | Freq: Once | INTRAMUSCULAR | 0 refills | Status: AC
  Filled 2024-10-05: qty 0.5, 1d supply, fill #0

## 2024-10-07 ENCOUNTER — Other Ambulatory Visit: Payer: Self-pay | Admitting: Medical Genetics

## 2024-10-07 DIAGNOSIS — Z006 Encounter for examination for normal comparison and control in clinical research program: Secondary | ICD-10-CM

## 2024-10-20 ENCOUNTER — Other Ambulatory Visit: Payer: Self-pay | Admitting: Internal Medicine

## 2024-10-21 NOTE — Telephone Encounter (Signed)
 Requested medications are due for refill today.  yes  Requested medications are on the active medications list.  yes  Last refill. 09/21/2024 #15 0 rf  Future visit scheduled.   yes  Notes to clinic.  Refill not delegated.    Requested Prescriptions  Pending Prescriptions Disp Refills   LORazepam  (ATIVAN ) 1 MG tablet 15 tablet 0    Sig: Take 0.5 tablets (0.5 mg total) by mouth 2 (two) times daily as needed for anxiety.     Not Delegated - Psychiatry: Anxiolytics/Hypnotics 2 Failed - 10/21/2024  5:02 PM      Failed - This refill cannot be delegated      Failed - Urine Drug Screen completed in last 360 days      Passed - Patient is not pregnant      Passed - Valid encounter within last 6 months    Recent Outpatient Visits           2 months ago Capsulitis of foot, right   [place] [facility] [place], Angeline ORN, NP   3 months ago Encounter for general adult medical examination with abnormal findings   [place] [facility] [place], Angeline ORN, NP

## 2024-10-22 ENCOUNTER — Other Ambulatory Visit (HOSPITAL_COMMUNITY): Payer: Self-pay

## 2024-10-22 MED ORDER — LORAZEPAM 1 MG PO TABS
0.5000 mg | ORAL_TABLET | Freq: Two times a day (BID) | ORAL | 0 refills | Status: DC | PRN
  Filled 2024-10-22: qty 15, 15d supply, fill #0

## 2024-10-25 ENCOUNTER — Other Ambulatory Visit: Payer: Self-pay

## 2024-12-11 ENCOUNTER — Other Ambulatory Visit: Payer: Self-pay | Admitting: Internal Medicine

## 2024-12-12 ENCOUNTER — Other Ambulatory Visit (HOSPITAL_COMMUNITY): Payer: Self-pay

## 2024-12-13 ENCOUNTER — Other Ambulatory Visit (HOSPITAL_COMMUNITY): Payer: Self-pay

## 2024-12-13 ENCOUNTER — Other Ambulatory Visit: Payer: Self-pay | Admitting: Internal Medicine

## 2024-12-13 ENCOUNTER — Other Ambulatory Visit: Payer: Self-pay

## 2024-12-13 MED ORDER — CITALOPRAM HYDROBROMIDE 40 MG PO TABS
40.0000 mg | ORAL_TABLET | Freq: Every day | ORAL | 0 refills | Status: DC
  Filled 2024-12-13: qty 90, 90d supply, fill #0

## 2024-12-13 MED ORDER — LORAZEPAM 1 MG PO TABS
0.5000 mg | ORAL_TABLET | Freq: Two times a day (BID) | ORAL | 0 refills | Status: AC | PRN
  Filled 2024-12-13: qty 15, 15d supply, fill #0

## 2024-12-13 NOTE — Telephone Encounter (Signed)
 Requested medication (s) are due for refill today: Yes  Requested medication (s) are on the active medication list: Yes  Last refill:  10/22/24  Future visit scheduled: Yes  Notes to clinic:  Unable to refill per protocol, cannot delegate.      Requested Prescriptions  Pending Prescriptions Disp Refills   LORazepam  (ATIVAN ) 1 MG tablet 15 tablet 0    Sig: Take 0.5 tablets (0.5 mg total) by mouth 2 (two) times daily as needed for anxiety.     Not Delegated - Psychiatry: Anxiolytics/Hypnotics 2 Failed - 12/13/2024  2:23 PM      Failed - This refill cannot be delegated      Failed - Urine Drug Screen completed in last 360 days      Passed - Patient is not pregnant      Passed - Valid encounter within last 6 months    Recent Outpatient Visits           4 months ago Capsulitis of foot, right   [place] [facility] [place], Angeline ORN, NP   4 months ago Encounter for general adult medical examination with abnormal findings   [place] [facility] [place], Angeline ORN, NP              Signed Prescriptions Disp Refills   citalopram  (CELEXA ) 40 MG tablet 90 tablet 0    Sig: Take 1 tablet (40 mg total) by mouth daily.     Psychiatry:  Antidepressants - SSRI Passed - 12/13/2024  2:23 PM      Passed - Completed PHQ-2 or PHQ-9 in the last 360 days      Passed - Valid encounter within last 6 months    Recent Outpatient Visits           4 months ago Capsulitis of foot, right   [place] [facility] [place], Angeline ORN, NP   4 months ago Encounter for general adult medical examination with abnormal findings   [place] [facility] [place], Angeline ORN, NP

## 2024-12-14 ENCOUNTER — Other Ambulatory Visit (HOSPITAL_COMMUNITY): Payer: Self-pay

## 2024-12-14 NOTE — Telephone Encounter (Signed)
 Requested medications are due for refill today.  See note  Requested medications are on the active medications list.  yes  Last refill. 12/13/2024 #90 0 rf  Future visit scheduled.   yes  Notes to clinic.  Pharmacy comment: Patient says she wants a 20mg  tablet since that is her current dose.     Requested Prescriptions  Pending Prescriptions Disp Refills   citalopram  (CELEXA ) 40 MG tablet 90 tablet 0    Sig: Take 1 tablet (40 mg total) by mouth daily.     Psychiatry:  Antidepressants - SSRI Passed - 12/14/2024  5:51 PM      Passed - Completed PHQ-2 or PHQ-9 in the last 360 days      Passed - Valid encounter within last 6 months    Recent Outpatient Visits           4 months ago Capsulitis of foot, right   [place] [facility] [place], Angeline ORN, NP   4 months ago Encounter for general adult medical examination with abnormal findings    [place] [facility] [place], Angeline ORN, NP

## 2024-12-15 ENCOUNTER — Other Ambulatory Visit: Payer: Self-pay

## 2024-12-15 ENCOUNTER — Other Ambulatory Visit (HOSPITAL_COMMUNITY): Payer: Self-pay

## 2024-12-15 MED ORDER — CITALOPRAM HYDROBROMIDE 20 MG PO TABS
20.0000 mg | ORAL_TABLET | Freq: Every day | ORAL | 0 refills | Status: AC
  Filled 2024-12-15: qty 90, 90d supply, fill #0

## 2024-12-15 NOTE — Addendum Note (Signed)
 Addended by: ANTONETTE ANGELINE ORN on: 12/15/2024 09:16 AM   Modules accepted: Orders

## 2024-12-30 ENCOUNTER — Encounter: Payer: Self-pay | Admitting: Internal Medicine

## 2025-01-18 ENCOUNTER — Ambulatory Visit: Admitting: Internal Medicine

## 2025-01-19 ENCOUNTER — Ambulatory Visit: Admitting: Internal Medicine

## 2025-01-19 NOTE — Progress Notes (Unsigned)
 "  Subjective:    Patient ID: Regina Huber, female    DOB: June 07, 1961, 64 y.o.   MRN: [id number]  HPI  Patient presents to clinic today for follow-up of chronic conditions.  Anxiety and depression (moderate, recurrent): Chronic, managed on citalopram  and lorazepam . She is not currently seeing a therapist. She denies SI/HI.  Gout: She denies recent flare. She is no longer taking allopurinol  but has colchicine  to take as needed. She does not follow with rheumatology.  HLD: Her last LDL was 98, triglycerides 757, 07/2024. She denies myalgias on rosuvastatin . She tries to consume a low fat diet.  HTN: Her BP today is 120/68.She is taking amlodipine  and hctz as prescribed. ECG from 03/2024 reviewed.  Insomnia: She has difficulty.  She takes trazadone as prescribed. There is no sleep studying of file.  Myasthenia gravis: Asymptomatic s/p thymectomy.  Prediabetes: Her last A1C was 6%, 07/2024. She is not taking any oral diabetic medication at this time. She does not check her sugars.  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 pt states she received 9 pints of blood   Vaginal intraepithelial neoplasia III (VAIN III)     Current Outpatient Medications  Medication Sig Dispense Refill   amLODipine  (NORVASC ) 5 MG tablet Take 1 tablet (5 mg total) by mouth daily. 90 tablet 1   citalopram  (CELEXA ) 20 MG tablet Take 1 tablet (20 mg total) by mouth daily. 90 tablet 0   colchicine  0.6 MG tablet TAKE 1 TABLET BY MOUTH 2 TIMES DAILY FOR 7 - 10 DAYS  THEN TAKE AS DIRECTED AS NEEDED FOR ATTACKS. (Patient taking differently: as needed.) 90 tablet 1   hydrochlorothiazide  (HYDRODIURIL ) 25 MG tablet Take 1 tablet (25 mg total) by mouth daily. 90 tablet 1   LORazepam  (ATIVAN ) 1 MG tablet Take 0.5 tablets (0.5 mg total) by mouth 2 (two) times daily as needed for anxiety. 15 tablet 0   rosuvastatin  (CRESTOR ) 5 MG tablet Take 1 tablet (5 mg total) by mouth daily. 90 tablet 0   traZODone  (DESYREL ) 50 MG tablet Take 1 tablet (50 mg total) by mouth at bedtime. (Patient taking differently: Take 25 mg by mouth at bedtime.) 30 tablet 0   No current facility-administered medications for this visit.    Allergies  Allergen Reactions   Doxycycline  Other (See Comments)    Oral blisters   Penicillins Rash    - has tolerated cephalosporins    Family History  Problem Relation Age of Onset   Hypertension Mother    Diabetes Mother    Breast cancer Mother    Alcohol abuse Father    Hypertension Father    Stroke Father    Colon cancer Father 79   Heart disease Brother 58       MI   Colon polyps Brother    Colon polyps Sister  Colon polyps Sister    Colon polyps Brother    Esophageal cancer Neg Hx    Rectal cancer Neg Hx    Stomach cancer Neg Hx     Social History   Socioeconomic History   Marital status: Single    Spouse name: Not on file   Number of children: Not on file   Years of education: Not on file   Highest education level: Bachelor's degree (e.g., BA, AB, BS)  Occupational History   Not on file  Tobacco Use   Smoking status: Never   Smokeless tobacco: Never  Vaping Use   Vaping status: Never Used  Substance and Sexual Activity   Alcohol use: Yes    Comment: rarely   Drug use: No   Sexual activity: Not on file  Other Topics Concern   Not on file  Social History Narrative   Married, twin daughters born 59, son born 26   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   Social Drivers of Health   Tobacco Use: Low Risk (08/11/2024)    Patient History    Smoking Tobacco Use: Never    Smokeless Tobacco Use: Never    Passive Exposure: Not on file  Financial Resource Strain: Low Risk (08/11/2024)   Overall Financial Resource Strain (CARDIA)    Difficulty of Paying Living Expenses: Not hard at all  Food Insecurity: No Food Insecurity (08/11/2024)   Epic    Worried About Radiation Protection Practitioner of Food in the Last Year: Never true    Ran Out of Food in the Last Year: Never true  Transportation Needs: No Transportation Needs (08/11/2024)   Epic    Lack of Transportation (Medical): No    Lack of Transportation (Non-Medical): No  Physical Activity: Insufficiently Active (08/11/2024)   Exercise Vital Sign    Days of Exercise per Week: 3 days    Minutes of Exercise per Session: 20 min  Stress: No Stress Concern Present (08/11/2024)   [employer] of Occupational Health - Occupational Stress Questionnaire    Feeling of Stress: Only a little  Social Connections: Moderately Integrated (08/11/2024)   Social Connection and Isolation Panel    Frequency of Communication with Friends and Family: Once a week    Frequency of Social Gatherings with Friends and Family: Three times a week    Attends Religious Services: More than 4 times per year    Active Member of Clubs or Organizations: Yes    Attends Banker Meetings: More than 4 times per year    Marital Status: Divorced  Intimate Partner Violence: Not on file  Depression (PHQ2-9): Low Risk (07/22/2024)   Depression (PHQ2-9)    PHQ-2 Score: 0  Alcohol Screen: Low Risk (01/27/2023)   Alcohol Screen    Last Alcohol Screening Score (AUDIT): 0  Housing: Low Risk (08/11/2024)   Epic    Unable to Pay for Housing in the Last Year: No    Number of Times Moved in the Last Year: 0    Homeless in the Last Year: No  Utilities: Not on file  Health Literacy: Not on file     Constitutional: Denies fever, malaise, fatigue, headache or abrupt weight changes.  HEENT: Denies eye pain, eye  redness, ear pain, ringing in the ears, wax buildup, runny nose, nasal congestion, bloody nose, or sore throat. Respiratory: Denies difficulty breathing, shortness of breath, cough or sputum production.   Cardiovascular: Denies chest pain, chest tightness, palpitations or swelling in the hands or feet.  Gastrointestinal: Denies abdominal pain, bloating, constipation, diarrhea or blood in the stool.  GU: Denies urgency, frequency, pain with urination, burning sensation, blood in urine, odor or discharge. Musculoskeletal: Denies decrease in range of motion, difficulty with gait, muscle pain or joint pain or swelling.  Skin:  Denies redness, rashes, lesion or ulcercations.  Neurological: Patient reports insomnia.  Denies dizziness, difficulty with memory, difficulty with speech or problems with balance and coordination.  Psych: Patient has a history of anxiety and depression.  Denies SI/HI.  No other specific complaints in a complete review of systems (except as listed in HPI above).  Objective:   Physical Exam   There were no vitals taken for this visit.   Wt Readings from Last 3 Encounters:  07/22/24 197 lb 3.2 oz (89.4 kg)  03/23/24 190 lb (86.2 kg)  01/27/24 190 lb (86.2 kg)    General: Appears her stated age, obese, in NAD. Skin: Warm, dry and intact.  HEENT: Head: normal shape and size; Eyes: sclera white, no icterus, conjunctiva pink, PERRLA and EOMs intact;  Cardiovascular: Normal rate and rhythm. S1,S2 noted.  No murmur, rubs or gallops noted. No JVD or BLE edema. No carotid bruits noted. Pulmonary/Chest: Normal effort and positive vesicular breath sounds. No respiratory distress. No wheezes, rales or ronchi noted.   Musculoskeletal: No difficulty with gait.  Neurological: Alert and oriented. Cranial nerves II-XII grossly intact. Coordination normal.  Psychiatric: Mood and affect normal. Behavior is normal. Judgment and thought content normal.   BMET    Component Value  Date/Time   NA 141 07/22/2024 0954   K 3.7 07/22/2024 0954   CL 100 07/22/2024 0954   CO2 30 07/22/2024 0954   GLUCOSE 96 07/22/2024 0954   BUN 12 07/22/2024 0954   CREATININE 0.79 07/22/2024 0954   CALCIUM  10.0 07/22/2024 0954   GFRNONAA >60 03/23/2024 1334   GFRNONAA 73 05/15/2021 0822   GFRAA 85 05/15/2021 0822    Lipid Panel     Component Value Date/Time   CHOL 185 07/22/2024 0954   TRIG 242 (H) 07/22/2024 0954   HDL 53 07/22/2024 0954   CHOLHDL 3.5 07/22/2024 0954   VLDL 38.4 09/20/2019 1004   LDLCALC 95 07/22/2024 0954    CBC    Component Value Date/Time   WBC 7.4 07/22/2024 0954   RBC 5.20 (H) 07/22/2024 0954   HGB 14.6 07/22/2024 0954   HCT 44.5 07/22/2024 0954   PLT 298 07/22/2024 0954   MCV 85.6 07/22/2024 0954   MCH 28.1 07/22/2024 0954   MCHC 32.8 07/22/2024 0954   RDW 13.1 07/22/2024 0954   LYMPHSABS 1.9 09/20/2019 1004   MONOABS 0.5 09/20/2019 1004   EOSABS 0.2 09/20/2019 1004   BASOSABS 0.0 09/20/2019 1004    Hgb A1C Lab Results  Component Value Date   HGBA1C 6.0 (H) 07/22/2024           Assessment & Plan:     RTC in 6 months for your annual exam Angeline Laura, NP  "

## 2025-01-20 ENCOUNTER — Ambulatory Visit: Admitting: Internal Medicine
